# Patient Record
Sex: Male | Born: 1945 | Race: White | Hispanic: No | Marital: Single | Smoking: Never smoker
Health system: Southern US, Community
[De-identification: ages and names within clinical notes are randomized; demographics above are authoritative.]

## PROBLEM LIST (undated history)

## (undated) DIAGNOSIS — L0390 Cellulitis, unspecified: Secondary | ICD-10-CM

## (undated) DIAGNOSIS — C189 Malignant neoplasm of colon, unspecified: Secondary | ICD-10-CM

## (undated) DIAGNOSIS — G629 Polyneuropathy, unspecified: Secondary | ICD-10-CM

## (undated) DIAGNOSIS — K651 Peritoneal abscess: Secondary | ICD-10-CM

## (undated) DIAGNOSIS — F79 Unspecified intellectual disabilities: Secondary | ICD-10-CM

## (undated) DIAGNOSIS — R4701 Aphasia: Secondary | ICD-10-CM

## (undated) DIAGNOSIS — D649 Anemia, unspecified: Secondary | ICD-10-CM

## (undated) DIAGNOSIS — H9190 Unspecified hearing loss, unspecified ear: Secondary | ICD-10-CM

## (undated) DIAGNOSIS — A0472 Enterocolitis due to Clostridium difficile, not specified as recurrent: Secondary | ICD-10-CM

## (undated) DIAGNOSIS — Z923 Personal history of irradiation: Secondary | ICD-10-CM

## (undated) DIAGNOSIS — K668 Other specified disorders of peritoneum: Secondary | ICD-10-CM

## (undated) DIAGNOSIS — F09 Unspecified mental disorder due to known physiological condition: Secondary | ICD-10-CM

## (undated) DIAGNOSIS — I10 Essential (primary) hypertension: Secondary | ICD-10-CM

## (undated) DIAGNOSIS — J189 Pneumonia, unspecified organism: Secondary | ICD-10-CM

---

## 2002-04-11 ENCOUNTER — Emergency Department (HOSPITAL_COMMUNITY): Admission: EM | Admit: 2002-04-11 | Discharge: 2002-04-12 | Payer: Self-pay | Admitting: Emergency Medicine

## 2002-04-11 ENCOUNTER — Encounter: Payer: Self-pay | Admitting: Emergency Medicine

## 2002-04-12 ENCOUNTER — Encounter: Payer: Self-pay | Admitting: Emergency Medicine

## 2004-05-24 ENCOUNTER — Ambulatory Visit (HOSPITAL_COMMUNITY): Admission: RE | Admit: 2004-05-24 | Discharge: 2004-05-24 | Payer: Self-pay | Admitting: Family Medicine

## 2004-07-22 ENCOUNTER — Emergency Department (HOSPITAL_COMMUNITY): Admission: EM | Admit: 2004-07-22 | Discharge: 2004-07-22 | Payer: Self-pay | Admitting: Family Medicine

## 2005-02-04 ENCOUNTER — Emergency Department (HOSPITAL_COMMUNITY): Admission: EM | Admit: 2005-02-04 | Discharge: 2005-02-04 | Payer: Self-pay | Admitting: Family Medicine

## 2005-09-21 ENCOUNTER — Emergency Department (HOSPITAL_COMMUNITY): Admission: EM | Admit: 2005-09-21 | Discharge: 2005-09-21 | Payer: Self-pay | Admitting: Emergency Medicine

## 2005-09-22 ENCOUNTER — Inpatient Hospital Stay (HOSPITAL_COMMUNITY): Admission: AD | Admit: 2005-09-22 | Discharge: 2005-10-02 | Payer: Self-pay | Admitting: Orthopaedic Surgery

## 2005-09-23 HISTORY — PX: OTHER SURGICAL HISTORY: SHX169

## 2005-10-03 ENCOUNTER — Ambulatory Visit: Payer: Self-pay | Admitting: Gastroenterology

## 2005-11-29 ENCOUNTER — Ambulatory Visit (HOSPITAL_COMMUNITY): Admission: RE | Admit: 2005-11-29 | Discharge: 2005-11-29 | Payer: Self-pay | Admitting: Internal Medicine

## 2006-01-26 ENCOUNTER — Inpatient Hospital Stay (HOSPITAL_COMMUNITY): Admission: RE | Admit: 2006-01-26 | Discharge: 2006-01-26 | Payer: Self-pay | Admitting: Orthopaedic Surgery

## 2006-01-26 HISTORY — PX: OTHER SURGICAL HISTORY: SHX169

## 2006-05-12 ENCOUNTER — Emergency Department (HOSPITAL_COMMUNITY): Admission: EM | Admit: 2006-05-12 | Discharge: 2006-05-12 | Payer: Self-pay | Admitting: Pediatrics

## 2008-01-15 ENCOUNTER — Encounter: Admission: RE | Admit: 2008-01-15 | Discharge: 2008-01-15 | Payer: Self-pay | Admitting: Family Medicine

## 2009-07-12 ENCOUNTER — Emergency Department (HOSPITAL_COMMUNITY): Admission: EM | Admit: 2009-07-12 | Discharge: 2009-07-12 | Payer: Self-pay | Admitting: Emergency Medicine

## 2010-06-03 NOTE — Op Note (Signed)
[name], Victor Little             ACCOUNT NO.:  [id number]   MEDICAL RECORD NO.:  [id number]          PATIENT TYPE:  INP   LOCATION:  5033                         FACILITY:  [facility]   PHYSICIAN:  Mark C. Ophelia Charter, M.D.    DATE OF BIRTH:  07/08/1945   DATE OF PROCEDURE:  DATE OF DISCHARGE:                                 OPERATIVE REPORT   PREOPERATIVE DIAGNOSIS:  Displaced right patellar fracture.   POSTOPERATIVE DIAGNOSIS:  Displaced right patellar fracture.   PROCEDURE:  Open reduction, internal fixation, right patella.   SURGEON:  Mark C. Ophelia Charter, M.D.   ANESTHESIA:  GOT.   TOURNIQUET TIME:  40 minutes.   DRAINS:  None.   OPERATIVE PROCEDURE:  After induction of general anesthesia, preoperative  Ancef prophylaxis, standard prepping and draping, the patient had an  abrasion over the patellar tendon region.  For this reason, a midline  incision was not made, and a higher transverse incision was made.  The leg  was wrapped with an Esmarch.  The tourniquet was inflated after the usual  extremity sheets, drapes, rolled towel, tourniquet, impervious stockinette  and Coban had been applied.  The tourniquet was inflated at 350 and deflated  before the end of the case to obtain hemostasis.  A transverse incision was  made through the subcutaneous tissue, and immediately the hemarthrosis was  noted.  Large clots were sucked out of the knee.  The knee was irrigated  thoroughly.  Towel clips were used to grasp the 2 fragments.  The fracture  was reduced and then pinned with 2 smooth Steinmann pins.  It was checked  under fluoroscopy, and once the pins were parallel to the surface and  exiting proximally, an 18-gauge wire was used in a figure-of-eight fashion,  passed proximally around the pins, looping underneath the pins distally,  which were then bent and cut, and then tied down with a square knot  tightening it over the handle of a Cobb.  After the square knot was tied,  the ends of the  wire were twisted down.  The wound was irrigated.  The  retinaculum was closed on both sides with Vicryl suture, 2-0 Vicryl in the  subcutaneous tissue, running 4-0 Vicryl subcuticular closure.  Tincture of  benzoin and Steri-Strips.  Marcaine infiltration/injection into the joint  and postoperative dressing with knee immobilizer.  Instrument count, needle  count were correct.      Mark C. Ophelia Charter, M.D.  Electronically Signed     MCY/MEDQ  D:  09/23/2005  T:  09/23/2005  Job:  [id number]

## 2010-06-03 NOTE — Op Note (Signed)
Victor Little, WISE NO.:  [id number]   MEDICAL RECORD NO.:  [id number]          PATIENT TYPE:  INP   LOCATION:  2899                         FACILITY:  [facility]   PHYSICIAN:  Mark C. Ophelia Charter, M.D.    DATE OF BIRTH:  June 18, 1945   DATE OF PROCEDURE:  01/26/2006  DATE OF DISCHARGE:                               OPERATIVE REPORT   PREOPERATIVE DIAGNOSIS:  Retained hardware, right patellar fracture,  painful.   POSTOPERATIVE DIAGNOSIS:  Retained hardware, right patellar fracture,  painful.   PROCEDURE:  Hardware removal, right patella.   SURGEON:  Mark C. Ophelia Charter, M.D.   ANESTHESIA:  General with Marcaine local.   DESCRIPTION OF PROCEDURE:  After induction of general anesthesia,  standard prepping with DuraPrep, impervious stockinette to the midtibia,  wrapped with Coban, the usual extremity sheets and drapes were applied.  Incision was made vertically over the palpable tender pin, which was  lateral.  It was grasped with one needle driver and removed.  The second  pin more medial was less palpable.  A small incision was made, 1 cm.  The pin was identified with the [facility], grasped and removed.  Next, the old transverse incision was opened about 2 cm in the middle.  Using the [facility], the 18-gauge wire was hooked, pulled up, cut,  and then the piece closest to the square knot was grasped, and with  pressure applied, the entire wire came out.  The wound was irrigated and  closed with 2-0 Vicryl subcu reapproximation and subcuticular in the  transverse incision, and 2 stitches in each of the vertical incisions  over the distal pin.  The patient tolerated the procedure well.  Soft  dressing applied after Marcaine, and then transferred to the recovery  room and then to home.  Office followup in 1 week.      Mark C. Ophelia Charter, M.D.  Electronically Signed     MCY/MEDQ  D:  01/26/2006  T:  01/26/2006  Job:  [id number]

## 2010-06-03 NOTE — Discharge Summary (Signed)
Victor Little, Victor Little             ACCOUNT NO.:  [id number]   MEDICAL RECORD NO.:  [id number]          PATIENT TYPE:  INP   LOCATION:  5033                         FACILITY:  [facility]   PHYSICIAN:  Mark C. Ophelia Charter, M.D.    DATE OF BIRTH:  1945/08/18   DATE OF ADMISSION:  09/22/2005  DATE OF DISCHARGE:  10/02/2005                                 DISCHARGE SUMMARY   ADMISSION DIAGNOSES:  1. Displaced right patellar fracture.  2. Death mute.  3. Organic brain syndrome with dementia.  4. Mental retardation.  5. Anxiety.   DISCHARGE DIAGNOSES:  1. Displaced right patellar fracture.  2. Death mute.  3. Organic brain syndrome with dementia.  4. Mental retardation.  5. Anxiety.  6. Postoperative ileus, resolved at discharge.  7. Hypokalemia, resolved at discharge.   PROCEDURE:  On September 23, 2005, the patient underwent open reduction  internal fixation, right displaced patellar fracture by Dr. Ophelia Charter under  general anesthesia.   CONSULTATIONS:  [facility] and Carlus Pavlov, M.D., for internal  medicine.   BRIEF HISTORY:  The patient is a 65 year old male who has mental retardation  and is a deaf mute. 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 He sustained a right displaced patellar fracture when he fell onto  his knee. It was felt he would require intervention and was admitted to the  hospital by Dr. Ophelia Charter for surgical intervention.   BRIEF HOSPITAL COURSE:  The patient tolerated the procedure under general  anesthesia without complications. Postoperatively, he was started on  physical therapy for ambulation and gait training, allowed weight bearing as  tolerated on the operative extremity. He was placed in a knee immobilizer  which was to be on at all times. Dressing changes were done daily, and his  wound was healing without signs of infection. Neurovascular motor function  distally in the lower extremities remains intact. The patient was difficult  to  communicate with throughout the hospital stay but did fairly well with  advancing with physical therapy. Unfortunately, he developed abdominal  distention with nausea. On examination, he was noted to have significant  distention of the abdomen with absent bowel sounds and tympanic percussion.  No rebound tenderness was noted. He was placed n.p.o. NG was recommended;  however, the patient refused. A medical consult was obtained as he did have  elevated blood count of 11.5. His electrolyte studies continued to be normal  initially. He did have a decrease in sodium and potassium which were treated  with potassium supplements as well as IV fluid support. A GI consult was  obtained from [facility]. It was recommended that narcotic analgesics be  afforded, and the patient's medications were changed to Darvocet which he  used infrequently. Rectal tube was placed with good liquid output by Dr.  Christella Hartigan on September 29, 2005. His diet was advanced on September 15. Rectal  tube was removed on September 17. His ileus was felt to be resolving, and  nursing home placement was sought. A bed was available at [facility]. He was felt stable for transfer on October 02, 2005.   PERTINENT LABORATORY VALUES:  Admission CBC with WBC 11.2, hemoglobin 15.8,  hematocrit 46.5. September 14 CBC showed WBC 9.3, hemoglobin 12.4,  hematocrit 35.9. Coagulation studies on admission were within normal limits.  Chemistries with potassium dropping to the lowest value of 3.0. Lipase was  noted to be 131, albumin 2.9, calcium decreased at 7.4. Mildly elevated  glucoses during the hospital stay ranging from 133 to 101. Chest x-ray on  September 11 showed bibasilar subsegmental atelectasis. Abdominal x-rays  were monitored following his colonic ileus. EKG on admission with normal  sinus rhythm.   PLAN:  The patient will be transferred to [facility]. There, he  should continue with physical therapy on a  daily basis for ambulation and  gait training. He should wear his knee immobilizer at all times. He is to  have no strengthening exercises of the right lower extremity nor any range  of motion of the right knee. The patient may be weight bearing as tolerated  on the right lower extremity. Dressing changes to his wound should be done  daily. He will follow up in two weeks with Dr. Ophelia Charter. The patient should be  provided transportation and assisted in making the appointment by calling  [phone]. He should continue with a regular diet. Other medications include  Lasix 20 mg daily, Aricept 10 mg p.o. every night, Ativan 0.5 mg p.o.  b.i.d., Norvasc 10 mg p.o. daily, Lotensin 20 mg p.o. daily. Pain medication  in the form of Tylenol has been utilized as it is best for his abdominal  situation. He is also on Celebrex 200 mg p.o. daily. If there are questions  or concerns regarding his orthopedic care, please notify Dr. Ophelia Charter' office.   CONDITION ON DISCHARGE:  Stable.      Victor Little, P.A.      Mark C. Ophelia Charter, M.D.  Electronically Signed    SMV/MEDQ  D:  10/02/2005  T:  10/02/2005  Job:  [id number]

## 2010-06-03 NOTE — Procedures (Signed)
REFERRED BY:  Evelena Peat, M.D.   HISTORY:  This is a 65 year old patient with history of mental retardation,  deafness, hypertension.  The patient had an episode of arms and legs jerking  about 5 minutes.  The patient is being evaluated for possible seizure.   This is a routine EEG.  No skull defects were noted.   MEDICATIONS:  Lotrel, Lasix, Celebrex, Clarinex, Aricept.   EEG CLASSIFICATION:  Normal awake.   DESCRIPTION OF RECORDING:  Background rhythm in this recording consistent  with a very well modulated, medium amplitude alpha rhythm of 10 Hz that is  reactive to eye opening and closure.  As the record progresses, the patient  undergoes photic stimulation.  This results in bilateral and symmetric  photic driving response.  Hyperventilation is also performed resulting in a  very minimal buildup of background rhythm activity without significant  slowing seen.  The patient seemed to remain in the awakened state throughout  the entirety of the recording.  At no time during the recording did there  appear to be evidence of spikes, spike wave discharge, or focal slowing.  EKG monitor shows no evidence of cardiac rhythm abnormalities with heart  rate of 66.   IMPRESSION:  This is a normal EEG recording in the waking state.  No  evidence of ictal or interictal discharges are seen.      OZH:YQMV  D:  05/24/2004 15:28:39  T:  05/24/2004 16:57:31  Job #:  [id number]

## 2012-12-05 ENCOUNTER — Emergency Department (HOSPITAL_COMMUNITY): Payer: PRIVATE HEALTH INSURANCE

## 2012-12-05 ENCOUNTER — Inpatient Hospital Stay (HOSPITAL_COMMUNITY)
Admission: EM | Admit: 2012-12-05 | Discharge: 2012-12-27 | DRG: 870 | Disposition: A | Discharge status: Skilled Nursing Facility | Payer: PRIVATE HEALTH INSURANCE | Attending: Internal Medicine | Admitting: Internal Medicine

## 2012-12-05 ENCOUNTER — Encounter (HOSPITAL_COMMUNITY): Payer: Self-pay | Admitting: Emergency Medicine

## 2012-12-05 DIAGNOSIS — R799 Abnormal finding of blood chemistry, unspecified: Secondary | ICD-10-CM | POA: Diagnosis present

## 2012-12-05 DIAGNOSIS — J9601 Acute respiratory failure with hypoxia: Secondary | ICD-10-CM | POA: Diagnosis present

## 2012-12-05 DIAGNOSIS — K560 Paralytic ileus: Secondary | ICD-10-CM | POA: Diagnosis present

## 2012-12-05 DIAGNOSIS — E860 Dehydration: Secondary | ICD-10-CM | POA: Diagnosis present

## 2012-12-05 DIAGNOSIS — R4701 Aphasia: Secondary | ICD-10-CM

## 2012-12-05 DIAGNOSIS — M25539 Pain in unspecified wrist: Secondary | ICD-10-CM | POA: Diagnosis not present

## 2012-12-05 DIAGNOSIS — F079 Unspecified personality and behavioral disorder due to known physiological condition: Secondary | ICD-10-CM | POA: Diagnosis present

## 2012-12-05 DIAGNOSIS — E8779 Other fluid overload: Secondary | ICD-10-CM | POA: Diagnosis not present

## 2012-12-05 DIAGNOSIS — I10 Essential (primary) hypertension: Secondary | ICD-10-CM | POA: Diagnosis present

## 2012-12-05 DIAGNOSIS — R509 Fever, unspecified: Secondary | ICD-10-CM | POA: Diagnosis not present

## 2012-12-05 DIAGNOSIS — Z781 Physical restraint status: Secondary | ICD-10-CM | POA: Diagnosis not present

## 2012-12-05 DIAGNOSIS — H913 Deaf nonspeaking, not elsewhere classified: Secondary | ICD-10-CM | POA: Diagnosis present

## 2012-12-05 DIAGNOSIS — Z23 Encounter for immunization: Secondary | ICD-10-CM

## 2012-12-05 DIAGNOSIS — G9340 Encephalopathy, unspecified: Secondary | ICD-10-CM | POA: Diagnosis not present

## 2012-12-05 DIAGNOSIS — N179 Acute kidney failure, unspecified: Secondary | ICD-10-CM | POA: Diagnosis present

## 2012-12-05 DIAGNOSIS — A419 Sepsis, unspecified organism: Principal | ICD-10-CM | POA: Diagnosis present

## 2012-12-05 DIAGNOSIS — K668 Other specified disorders of peritoneum: Secondary | ICD-10-CM | POA: Diagnosis present

## 2012-12-05 DIAGNOSIS — E872 Acidosis, unspecified: Secondary | ICD-10-CM | POA: Diagnosis present

## 2012-12-05 DIAGNOSIS — D72829 Elevated white blood cell count, unspecified: Secondary | ICD-10-CM

## 2012-12-05 DIAGNOSIS — R197 Diarrhea, unspecified: Secondary | ICD-10-CM

## 2012-12-05 DIAGNOSIS — H9193 Unspecified hearing loss, bilateral: Secondary | ICD-10-CM

## 2012-12-05 DIAGNOSIS — F79 Unspecified intellectual disabilities: Secondary | ICD-10-CM | POA: Diagnosis present

## 2012-12-05 DIAGNOSIS — G629 Polyneuropathy, unspecified: Secondary | ICD-10-CM | POA: Diagnosis present

## 2012-12-05 DIAGNOSIS — G589 Mononeuropathy, unspecified: Secondary | ICD-10-CM | POA: Diagnosis present

## 2012-12-05 DIAGNOSIS — D75838 Other thrombocytosis: Secondary | ICD-10-CM | POA: Diagnosis present

## 2012-12-05 DIAGNOSIS — K529 Noninfective gastroenteritis and colitis, unspecified: Secondary | ICD-10-CM

## 2012-12-05 DIAGNOSIS — M79601 Pain in right arm: Secondary | ICD-10-CM

## 2012-12-05 DIAGNOSIS — R7989 Other specified abnormal findings of blood chemistry: Secondary | ICD-10-CM

## 2012-12-05 DIAGNOSIS — H9190 Unspecified hearing loss, unspecified ear: Secondary | ICD-10-CM | POA: Diagnosis present

## 2012-12-05 DIAGNOSIS — K651 Peritoneal abscess: Secondary | ICD-10-CM | POA: Diagnosis present

## 2012-12-05 DIAGNOSIS — K659 Peritonitis, unspecified: Secondary | ICD-10-CM | POA: Diagnosis present

## 2012-12-05 DIAGNOSIS — J189 Pneumonia, unspecified organism: Secondary | ICD-10-CM | POA: Diagnosis present

## 2012-12-05 DIAGNOSIS — K56609 Unspecified intestinal obstruction, unspecified as to partial versus complete obstruction: Secondary | ICD-10-CM | POA: Diagnosis present

## 2012-12-05 DIAGNOSIS — R062 Wheezing: Secondary | ICD-10-CM | POA: Diagnosis present

## 2012-12-05 DIAGNOSIS — I959 Hypotension, unspecified: Secondary | ICD-10-CM

## 2012-12-05 DIAGNOSIS — A0472 Enterocolitis due to Clostridium difficile, not specified as recurrent: Secondary | ICD-10-CM | POA: Diagnosis present

## 2012-12-05 DIAGNOSIS — J690 Pneumonitis due to inhalation of food and vomit: Secondary | ICD-10-CM | POA: Diagnosis not present

## 2012-12-05 DIAGNOSIS — Z79899 Other long term (current) drug therapy: Secondary | ICD-10-CM

## 2012-12-05 DIAGNOSIS — E876 Hypokalemia: Secondary | ICD-10-CM | POA: Diagnosis not present

## 2012-12-05 DIAGNOSIS — I9589 Other hypotension: Secondary | ICD-10-CM | POA: Diagnosis present

## 2012-12-05 DIAGNOSIS — J9600 Acute respiratory failure, unspecified whether with hypoxia or hypercapnia: Secondary | ICD-10-CM | POA: Diagnosis not present

## 2012-12-05 DIAGNOSIS — F09 Unspecified mental disorder due to known physiological condition: Secondary | ICD-10-CM | POA: Diagnosis present

## 2012-12-05 HISTORY — DX: Essential (primary) hypertension: I10

## 2012-12-05 HISTORY — DX: Unspecified hearing loss, unspecified ear: H91.90

## 2012-12-05 HISTORY — DX: Unspecified mental disorder due to known physiological condition: F09

## 2012-12-05 HISTORY — DX: Unspecified intellectual disabilities: F79

## 2012-12-05 LAB — CBC WITH DIFFERENTIAL/PLATELET
Basophils Relative: 0 % (ref 0–1)
Eosinophils Absolute: 0 10*3/uL (ref 0.0–0.7)
Hemoglobin: 14.5 g/dL (ref 13.0–17.0)
Lymphocytes Relative: 2 % — ABNORMAL LOW (ref 12–46)
MCHC: 34.5 g/dL (ref 30.0–36.0)
Neutrophils Relative %: 91 % — ABNORMAL HIGH (ref 43–77)
RBC: 4.9 MIL/uL (ref 4.22–5.81)

## 2012-12-05 LAB — COMPREHENSIVE METABOLIC PANEL
ALT: 18 U/L (ref 0–53)
ALT: 18 U/L (ref 0–53)
AST: 36 U/L (ref 0–37)
AST: 36 U/L (ref 0–37)
Albumin: 3 g/dL — ABNORMAL LOW (ref 3.5–5.2)
Albumin: 2.8 g/dL — ABNORMAL LOW (ref 3.5–5.2)
Alkaline Phosphatase: 72 U/L (ref 39–117)
Alkaline Phosphatase: 70 U/L (ref 39–117)
CO2: 22 mEq/L (ref 19–32)
Chloride: 104 mEq/L (ref 96–112)
Creatinine, Ser: 1.98 mg/dL — ABNORMAL HIGH (ref 0.50–1.35)
GFR calc Af Amer: 35 mL/min — ABNORMAL LOW (ref >90)
GFR calc Af Amer: 39 mL/min — ABNORMAL LOW (ref >90)
GFR calc non Af Amer: 33 mL/min — ABNORMAL LOW (ref >90)
Glucose, Bld: 117 mg/dL — ABNORMAL HIGH (ref 70–99)
Glucose, Bld: 101 mg/dL — ABNORMAL HIGH (ref 70–99)
Potassium: 4.5 mEq/L (ref 3.5–5.1)
Potassium: 4.2 mEq/L (ref 3.5–5.1)
Sodium: 141 mEq/L (ref 135–145)
Sodium: 139 mEq/L (ref 135–145)
Total Bilirubin: 0.8 mg/dL (ref 0.3–1.2)
Total Protein: 5.7 g/dL — ABNORMAL LOW (ref 6.0–8.3)

## 2012-12-05 LAB — CBC
Hemoglobin: 13.5 g/dL (ref 13.0–17.0)
MCH: 28.7 pg (ref 26.0–34.0)
RBC: 4.71 MIL/uL (ref 4.22–5.81)
RDW: 13.8 % (ref 11.5–15.5)
WBC: 24.3 10*3/uL — ABNORMAL HIGH (ref 4.0–10.5)

## 2012-12-05 LAB — URINALYSIS, ROUTINE W REFLEX MICROSCOPIC
Glucose, UA: NEGATIVE mg/dL
Hgb urine dipstick: NEGATIVE
Specific Gravity, Urine: 1.024 (ref 1.005–1.030)
pH: 5 (ref 5.0–8.0)

## 2012-12-05 LAB — URINE MICROSCOPIC-ADD ON

## 2012-12-05 LAB — MRSA PCR SCREENING: MRSA by PCR: NEGATIVE

## 2012-12-05 LAB — PROCALCITONIN: Procalcitonin: 5.28 ng/mL

## 2012-12-05 MED ORDER — ONDANSETRON HCL 4 MG/2ML IJ SOLN: 4.0000 mg | Freq: Four times a day (QID) | INTRAMUSCULAR | Status: DC | PRN

## 2012-12-05 MED ORDER — SODIUM CHLORIDE 0.9 % IV BOLUS (SEPSIS)
1000.0000 mL | Freq: Once | INTRAVENOUS | Status: AC
  Administered 2012-12-05: 1000 mL via INTRAVENOUS

## 2012-12-05 MED ORDER — FENTANYL CITRATE 0.05 MG/ML IJ SOLN
25.0000 ug | INTRAMUSCULAR | Status: DC | PRN
  Administered 2012-12-06 – 2012-12-07 (×7): 25 ug via INTRAVENOUS
  Filled 2012-12-05 (×7): qty 2

## 2012-12-05 MED ORDER — VANCOMYCIN HCL IN DEXTROSE 1-5 GM/200ML-% IV SOLN: 1000.0000 mg | Freq: Once | INTRAVENOUS | Status: DC

## 2012-12-05 MED ORDER — MORPHINE SULFATE 4 MG/ML IJ SOLN: 4.0000 mg | Freq: Once | INTRAMUSCULAR | Status: DC

## 2012-12-05 MED ORDER — SODIUM CHLORIDE 0.9 % IV SOLN
INTRAVENOUS | Status: DC
  Administered 2012-12-06: 03:00:00 via INTRAVENOUS

## 2012-12-05 MED ORDER — VANCOMYCIN HCL 10 G IV SOLR
1250.0000 mg | INTRAVENOUS | Status: DC
  Filled 2012-12-05: qty 1250

## 2012-12-05 MED ORDER — ACETAMINOPHEN 650 MG RE SUPP
650.0000 mg | Freq: Four times a day (QID) | RECTAL | Status: DC | PRN
  Filled 2012-12-05: qty 1

## 2012-12-05 MED ORDER — SODIUM CHLORIDE 0.9 % IR SOLN
500.0000 mg | Freq: Four times a day (QID) | Status: DC
  Administered 2012-12-05 – 2012-12-11 (×22): 500 mg via RECTAL
  Filled 2012-12-05 (×39): qty 500

## 2012-12-05 MED ORDER — VANCOMYCIN HCL IN DEXTROSE 1-5 GM/200ML-% IV SOLN
1000.0000 mg | INTRAVENOUS | Status: AC
  Administered 2012-12-05: 1000 mg via INTRAVENOUS
  Filled 2012-12-05: qty 200

## 2012-12-05 MED ORDER — PIPERACILLIN-TAZOBACTAM 3.375 G IVPB
3.3750 g | Freq: Three times a day (TID) | INTRAVENOUS | Status: DC
  Administered 2012-12-05 – 2012-12-19 (×41): 3.375 g via INTRAVENOUS
  Filled 2012-12-05 (×46): qty 50

## 2012-12-05 MED ORDER — INFLUENZA VAC SPLIT QUAD 0.5 ML IM SUSP
0.5000 mL | INTRAMUSCULAR | Status: AC
  Administered 2012-12-06: 0.5 mL via INTRAMUSCULAR
  Filled 2012-12-05 (×2): qty 0.5

## 2012-12-05 MED ORDER — SODIUM CHLORIDE 0.9 % IJ SOLN
3.0000 mL | Freq: Two times a day (BID) | INTRAMUSCULAR | Status: DC
  Administered 2012-12-05 (×2): 3 mL via INTRAVENOUS
  Administered 2012-12-11: 10 mL via INTRAVENOUS

## 2012-12-05 MED ORDER — ACETAMINOPHEN 325 MG PO TABS: 650.0000 mg | ORAL_TABLET | Freq: Four times a day (QID) | ORAL | Status: DC | PRN

## 2012-12-05 MED ORDER — VANCOMYCIN 50 MG/ML ORAL SOLUTION: 500.0000 mg | Freq: Four times a day (QID) | ORAL | Status: DC

## 2012-12-05 MED ORDER — SODIUM CHLORIDE 0.9 % IV SOLN
INTRAVENOUS | Status: DC
  Administered 2012-12-05: 09:00:00 via INTRAVENOUS

## 2012-12-05 MED ORDER — ONDANSETRON HCL 4 MG PO TABS: 4.0000 mg | ORAL_TABLET | Freq: Four times a day (QID) | ORAL | Status: DC | PRN

## 2012-12-05 MED ORDER — ONDANSETRON HCL 4 MG/2ML IJ SOLN: 4.0000 mg | Freq: Three times a day (TID) | INTRAMUSCULAR | Status: DC | PRN

## 2012-12-05 MED ORDER — METRONIDAZOLE IN NACL 5-0.79 MG/ML-% IV SOLN
500.0000 mg | Freq: Three times a day (TID) | INTRAVENOUS | Status: AC
  Administered 2012-12-05 – 2012-12-18 (×40): 500 mg via INTRAVENOUS
  Filled 2012-12-05 (×41): qty 100

## 2012-12-05 MED ORDER — IOHEXOL 300 MG/ML  SOLN
50.0000 mL | Freq: Once | INTRAMUSCULAR | Status: AC | PRN
  Administered 2012-12-05: 50 mL via ORAL

## 2012-12-05 MED ORDER — PIPERACILLIN-TAZOBACTAM 3.375 G IVPB 30 MIN
3.3750 g | INTRAVENOUS | Status: AC
Start: 2012-12-05 — End: 2012-12-05
  Administered 2012-12-05: 3.375 g via INTRAVENOUS
  Filled 2012-12-05: qty 50

## 2012-12-05 MED ORDER — PIPERACILLIN-TAZOBACTAM 3.375 G IVPB: 3.3750 g | Freq: Once | INTRAVENOUS | Status: DC

## 2012-12-05 MED ORDER — ONDANSETRON HCL 4 MG/2ML IJ SOLN
4.0000 mg | Freq: Once | INTRAMUSCULAR | Status: AC
  Administered 2012-12-05: 4 mg via INTRAVENOUS
  Filled 2012-12-05: qty 2

## 2012-12-05 MED ORDER — FENTANYL CITRATE 0.05 MG/ML IJ SOLN
50.0000 ug | Freq: Once | INTRAMUSCULAR | Status: AC
  Administered 2012-12-05: 50 ug via INTRAVENOUS
  Filled 2012-12-05: qty 2

## 2012-12-05 MED ORDER — SODIUM CHLORIDE 0.9 % IV BOLUS (SEPSIS)
1000.0000 mL | INTRAVENOUS | Status: DC | PRN
  Administered 2012-12-13 – 2012-12-21 (×2): 1000 mL via INTRAVENOUS

## 2012-12-05 MED ORDER — FENTANYL CITRATE 0.05 MG/ML IJ SOLN
50.0000 ug | Freq: Once | INTRAMUSCULAR | Status: AC
  Administered 2012-12-05: 50 ug via INTRAVENOUS

## 2012-12-05 MED ORDER — MORPHINE SULFATE 2 MG/ML IJ SOLN
2.0000 mg | INTRAMUSCULAR | Status: DC | PRN
  Administered 2012-12-06: 2 mg via INTRAVENOUS
  Filled 2012-12-05 (×2): qty 1

## 2012-12-05 NOTE — Progress Notes (Signed)
CRITICAL VALUE ALERT  Critical value received:  Positive Cdiff  Date of notification:  12-05-12  Time of notification:  1313  Critical value read back:  yes  Nurse who received alert:  Roney Jaffe, RN  MD notified (1st page):  Dr. Chancy Milroy  Time of first page:  1520  MD notified (2nd page):   Time of second page:  Responding MD: Chancy Milroy   Time MD responded:

## 2012-12-05 NOTE — H&P (Signed)
[facility] History and Physical  Victor Little WUJ:[id number] DOB: 09/06/1945 DOA: 12/05/2012  Referring physician: Rochele Raring, ER physician PCP: Patient is followed by a physician at [facility], will review records to enter in EMR Specialists: None  Chief Complaint: Diarrhea  HPI: Victor Little is a 67 y.o. male  With past history of organic brain syndrome, hypertension and is deaf who lives in a group home presented to the emergency room after multiple episodes of diarrhea. In the emergency room, he was noted to have markedly elevated white blood cell count, acute renal failure, hypotension also signs consistent with septic shock. Cultures were sent for C. difficile. CT scan confirmed rectosigmoid colitis. In addition,? Small bowel distraction was noted. Patient was given IV fluids and initially Zosyn and vancomycin in the emergency room. Hospitals were called for evaluation and admission. After accepting admission, patient was sent to the step down unit. C. difficile cultures came back positive and IV antibiotics were discontinued. C. difficile shock protocol started with IV Flagyl and vancomycin enemas  Review of Systems:  Patient seen after transfer to step down unit. Am unable to get review of systems as he currently is nonverbal.  Past Medical History  Diagnosis Date  . Mental retardation   . Organic brain syndrome   . Hypertension   . Deaf    No past surgical history on file. Social History:  reports that he does not drink alcohol or use illicit drugs. His tobacco history is not on file. Patient lives in a group home. I'm unable to get from him whether he smokes or drinks alcohol.  No Known Allergies  No family history on file. patient unable to give me any family history. Could not find any from his records  Prior to Admission medications   Medication Sig Start Date End Date Taking? Authorizing Provider  amLODipine (NORVASC) 10 MG tablet Take 1 tablet by  mouth daily. 11/16/12  Yes Historical Provider, MD  benazepril (LOTENSIN) 20 MG tablet Take 1 tablet by mouth daily. 11/16/12  Yes Historical Provider, MD  furosemide (LASIX) 20 MG tablet Take 1 tablet by mouth daily. 11/16/12  Yes Historical Provider, MD  gabapentin (NEURONTIN) 300 MG capsule Take 1 capsule by mouth 3 (three) times daily. 11/16/12  Yes Historical Provider, MD  hyoscyamine (LEVBID) 0.375 MG 12 hr tablet Take 0.375 mg by mouth 2 (two) times daily.   Yes Historical Provider, MD  loratadine (CLARITIN) 10 MG tablet Take 10 mg by mouth daily.   Yes Historical Provider, MD  meloxicam (MOBIC) 7.5 MG tablet Take 1 tablet by mouth daily. 11/16/12  Yes Historical Provider, MD  Probiotic Product (PROBIOTIC FORMULA PO) Take 1 capsule by mouth daily.   Yes Historical Provider, MD  risperiDONE (RISPERDAL) 1 MG tablet Take 1 tablet by mouth daily. 11/16/12  Yes Historical Provider, MD   Physical Exam: Filed Vitals:   12/05/12 1330  BP: 80/49  Pulse: 106  Temp: 99.7 F (37.6 C)  Resp: 14     General:   Somnolence, but opens his eyes and tracks. At this time does not verbalize. He is deaf.   Eyes: Sclera nonicteric, extraocular movements appear to be intact  ENT: Normocephalic, traumatic, poor dentition, mucous membranes are dry  Neck: Supple, no JVD  Cardiovascular: Regular rate and rhythm, S1-S2  Respiratory: Clear to auscultation bilaterally  Abdomen: Soft, mild distention,? Nontender, no bowel sounds  Skin: No skin breaks, tears or lesions   Musculoskeletal: no clubbing or cyanosis, trace  pitting edema  Psychiatric: Patient has underlying history of questionable mental retardation/organic brain syndrome  Neurologic: No overt deficits  Labs on Admission:  Basic Metabolic Panel:  Recent Labs Lab 12/05/12 0747  NA 141  K 4.5  CL 106  CO2 21  GLUCOSE 117*  BUN 44*  CREATININE 2.16*  CALCIUM 7.3*   Liver Function Tests:  Recent Labs Lab 12/05/12 0747  AST 36   ALT 18  ALKPHOS 72  BILITOT 0.9  PROT 5.7*  ALBUMIN 3.0*    Recent Labs Lab 12/05/12 0747  LIPASE 48   No results found for this basename: AMMONIA,  in the last 168 hours CBC:  Recent Labs Lab 12/05/12 0747  WBC 27.7*  NEUTROABS 25.2*  HGB 14.5  HCT 42.0  MCV 85.7  PLT 190   Cardiac Enzymes: No results found for this basename: CKTOTAL, CKMB, CKMBINDEX, TROPONINI,  in the last 168 hours  BNP (last 3 results) No results found for this basename: PROBNP,  in the last 8760 hours CBG: No results found for this basename: GLUCAP,  in the last 168 hours  Radiological Exams on Admission: Ct Abdomen Pelvis Wo Contrast  12/05/2012     IMPRESSION: Thickening of the walls of the rectosigmoid colon suggestive of colitis.  Findings worrisome for partial or early small bowel obstruction due to adhesions. No evidence of ischemia is present.  Small bilateral pleural effusions and basilar atelectasis.  Contrast in the distal esophagus compatible with esophageal reflux or dysmotility. Walls of the distal esophagus are mildly thickened suggesting esophagitis.   Electronically Signed   By: Drusilla Kanner M.D.   On: 12/05/2012 11:22      Assessment/Plan Principal Problem:   Septic shock: Positive C. difficile cultures. Reports are that patient was exposed to another member of group home who did have C. difficile. Started on IV Flagyl plus vancomycin enemas given small bowel obstruction. Aggressive IV fluid resuscitation. Looks to be stabilizing. She can step down unit Active Problems: C. difficile: See above   Organic brain syndrome (chronic)   Deaf   ARF (acute renal failure): Secondary to shock. IV fluids.   Secondary hypotension: Secondary to shock. IV fluids.   SBO (small bowel obstruction): Stable for now. Does not look uncomfortable. Repeat x-ray in the morning, if worse, consult surgery and/or place NG tube:   HTN (hypertension): N.p.o. and given hypotension, no  antihypertensives at this time   Neuropathy: Holding Neurontin    Code Status: Confirmed full code as per care facility  Family Communication: Family was here earlier, although no contact number on chart. Group home care coordinator number listed  Disposition Plan: Here for at least a few days  Time spent: 35 minutes  Hollice Espy [facility] Pager [phone]  If 7PM-7AM, please contact night-coverage [email or web address] Password [facility] 12/05/2012, 2:27 PM

## 2012-12-05 NOTE — Progress Notes (Signed)
CARE MANAGEMENT NOTE 12/05/2012  Patient:  Victor Little, Victor Little   Account Number:  [id number]  Date Initiated:  12/05/2012  Documentation initiated by:  Roger Kettles  Subjective/Objective Assessment:   pt with mental challenges and now hypotensive, positive c-diff,     Action/Plan:   tbd/from [facility]   Anticipated DC Date:  12/08/2012   Anticipated DC Plan:  GROUP HOME  In-house referral  Clinical Social Worker      DC Planning Services  NA      [facility] Choice  NA   Choice offered to / List presented to:  NA   DME arranged  NA      DME agency  NA     HH arranged  NA      HH agency  NA   Status of service:  In process, will continue to follow Medicare Important Message given?  NA - LOS <3 / Initial given by admissions (If response is "NO", the following Medicare IM given date fields will be blank) Date Medicare IM given:   Date Additional Medicare IM given:    Discharge Disposition:    Per UR Regulation:  Reviewed for med. necessity/level of care/duration of stay  If discussed at Long Length of Stay Meetings, dates discussed:    Comments:  [id number] Tep Lorrin Mais: Case management [phone] Chart reviewed and updated.  Next chart review due on [id number]. Needs for discharge at time of review:  None

## 2012-12-05 NOTE — ED Notes (Signed)
Per EMS report: pt from [facility]: pt has had gross amount of diarrhea x 12 hours.  Pt is deaf at baseline as welling as having an organic brain disorder.  Facility informed EMS that pt is normally able to get up and feed himself but has not had the energy to do so this morning.  On EMS arrival, pt's initial was 81/50 to which EMS infused a 1L NaCl bolus.  Pt's BP increased 96/60.  EMS noted pt experiecing some chills. EMS CBG: 132

## 2012-12-05 NOTE — ED Provider Notes (Signed)
TIME SEEN: 7:32 AM  CHIEF COMPLAINT: Vomiting, diarrhea  HPI: Patient is a 67 year old male with a history of hypertension, mental retardation, deafness who presents to the emergency department from his group home, therapeutic alternatives, with 12 hours of vomiting and diarrhea. Staff report that he has not had a fever, bloody stool or melena. No recent antibiotic use or hospitalization. No known sick contacts. Per EMS, patient's initial blood pressure was 81/50. He was given 1 L of IV fluid and his blood pressure improved to 96/60. His blood glucose was 132. Patient has been having chills and is complaining of abdominal pain and headache.  PCP is at [facility]  ROS: See HPI Constitutional: no fever  Eyes: no drainage  ENT: no runny nose   Cardiovascular:  no chest pain  Resp: no SOB  GI: vomiting GU: no dysuria Integumentary: no rash  Allergy: no hives  Musculoskeletal: no leg swelling  Neurological: no slurred speech ROS otherwise negative  PAST MEDICAL HISTORY/PAST SURGICAL HISTORY:  Past Medical History  Diagnosis Date  . Mental retardation   . Organic brain syndrome   . Hypertension   . Deaf     MEDICATIONS:  Prior to Admission medications   Medication Sig Start Date End Date Taking? Authorizing Provider  amLODipine (NORVASC) 10 MG tablet Take 1 tablet by mouth daily. 11/16/12   Historical Provider, MD  benazepril (LOTENSIN) 20 MG tablet Take 1 tablet by mouth daily. 11/16/12   Historical Provider, MD  furosemide (LASIX) 20 MG tablet Take 1 tablet by mouth daily. 11/16/12   Historical Provider, MD  gabapentin (NEURONTIN) 300 MG capsule Take 1 capsule by mouth 3 (three) times daily. 11/16/12   Historical Provider, MD  meloxicam (MOBIC) 7.5 MG tablet Take 1 tablet by mouth daily. 11/16/12   Historical Provider, MD  risperiDONE (RISPERDAL) 1 MG tablet Take 1 tablet by mouth daily. 11/16/12   Historical Provider, MD    ALLERGIES:  No Known Allergies  SOCIAL HISTORY:   History  Substance Use Topics  . Smoking status: Not on file  . Smokeless tobacco: Not on file  . Alcohol Use: No    FAMILY HISTORY: No family history on file.  EXAM: BP 96/59  Pulse 107  Temp(Src) 97.6 F (36.4 C) (Oral)  Resp 18  SpO2 95% CONSTITUTIONAL: Alert and oriented and responds appropriately to questions with sign language interpreter. Well-appearing; mildly dehydrated HEAD: Normocephalic EYES: Conjunctivae clear, PERRL ENT: normal nose; no rhinorrhea; dry mucous membranes; pharynx without lesions noted NECK: Supple, no meningismus, no LAD  CARD: Tachycardic; S1 and S2 appreciated; no murmurs, no clicks, no rubs, no gallops RESP: Normal chest excursion without splinting or tachypnea; breath sounds clear and equal bilaterally; no wheezes, no rhonchi, no rales,  ABD/GI: Normal bowel sounds; non-distended; soft, tender to palpation in the right lower quadrant with guarding, no rebound or peritoneal signs BACK:  The back appears normal and is non-tender to palpation, there is no CVA tenderness EXT: Normal ROM in all joints; non-tender to palpation; no edema; normal capillary refill; no cyanosis    SKIN: Normal color for age and race; warm; pt has dried diarrhea on his lower extremities NEURO: Moves all extremities equally; no facial droop PSYCH: The patient's mood and manner are appropriate. Grooming and personal hygiene are appropriate.  MEDICAL DECISION MAKING: Patient here with vomiting and diarrhea for the past 12 hours. He also has right lower quadrant pain on exam - concern for appendicitis v colitis v UTI v viral illness.  No recent antibiotic use, hospitalization or immunocompromised state to suggest  C diff colitis.  Suspect hypotension and tachycardia secondary to dehydration from vomiting and diarrhea and that this will continue to improve with IV fluids. Will obtain labs, urine and CT abdomen pelvis. We'll continue IV fluids, give pain and nausea medication.  ED  PROGRESS: The patient has a leukocytosis of 27.7 with left shift. Lactate is 3.16. Creatinine is elevated at 2.16, suspect this is do to dehydration. Blood pressure has continued to improve with IV fluids. Will also give broad-spectrum antibiotics and obtain blood culture, urine culture. Will also obtain stool culture, PCR for C diff.  CT abdomen and pelvis is pending.   8:36 AM  BP improved to 111/61 after 3L IVF.  Per group home records, SBP usually in the 130s.  He did not receive his anti-hypertensives today.  10:35 AM  BP continuing to improve and stable.  Pt is still tachycardic.  CT AP pending.  11:45 AM  BP stable.  CT AP shows thickening of the walls of the rectosigmoid colon suggestive of colitis. Stool cultures pending. He has received antibiotics. Findings also worrisome for partial or early small bowel obstruction due to adhesions. Will discuss with hospitalist for admission for the need hydration, IV antibiotics, bowel rest.      CRITICAL CARE Performed by: Raelyn Number   Total critical care time: 30 minutes  Critical care time was exclusive of separately billable procedures and treating other patients.  Critical care was necessary to treat or prevent imminent or life-threatening deterioration.  Critical care was time spent personally by me on the following activities: development of treatment plan with patient and/or surrogate as well as nursing, discussions with consultants, evaluation of patient's response to treatment, examination of patient, obtaining history from patient or surrogate, ordering and performing treatments and interventions, ordering and review of laboratory studies, ordering and review of radiographic studies, pulse oximetry and re-evaluation of patient's condition.   Layla Maw Ward, DO 12/05/12 1624

## 2012-12-05 NOTE — Progress Notes (Signed)
ANTIBIOTIC CONSULT NOTE - INITIAL  Pharmacy Consult for vancomycin, Zosyn Indication: rule out sepsis  No Known Allergies  Patient Measurements: Height: 6' (182.9 cm) Weight: 182 lb 5 oz (82.696 kg) IBW/kg (Calculated) : 77.6  Vital Signs: Temp: 98.5 F (36.9 C) (11/20 0758) Temp src: Rectal (11/20 0758) BP: 100/60 mmHg (11/20 0915) Pulse Rate: 111 (11/20 0915) Intake/Output from previous day:   Intake/Output from this shift:    Labs:  Recent Labs  12/05/12 0747  WBC 27.7*  HGB 14.5  PLT 190  CREATININE 2.16*   Estimated Creatinine Clearance: 36.9 ml/min (by C-G formula based on Cr of 2.16). No results found for this basename: VANCOTROUGH, VANCOPEAK, VANCORANDOM, GENTTROUGH, GENTPEAK, GENTRANDOM, TOBRATROUGH, TOBRAPEAK, TOBRARND, AMIKACINPEAK, AMIKACINTROU, AMIKACIN,  in the last 72 hours   Microbiology: No results found for this or any previous visit (from the past 720 hour(s)).  Medical History: Past Medical History  Diagnosis Date  . Mental retardation   . Organic brain syndrome   . Hypertension   . Deaf     Medications:  Scheduled:   Infusions:  . sodium chloride 150 mL/hr at 12/05/12 0914  . vancomycin 1,000 mg (12/05/12 1610)   Assessment: 67 yo with hx HTN, mental retardation, deafness who presented to ER from group home with 12 hours of vomiting and diarrhea and initial BP of 81/50. Patient also with elevated lactic acid and procalcitonin. Md ordered to start broad spectrum abx's for possible sepsis (appendicitis vs colitis vs UTI vs viral illness per ER Md)  Estimated CrCl 34N  Elevated WBC  To also rule out Cdiff  Goal of Therapy:  Vancomycin trough level 15-20 mcg/ml  Plan:  1) Vancomycin 1g x 1 in ER at 0917 then 1250mg  IV q24 thereafter 2) Zosyn 3.375g IV x 1 in ER then Zosyn 3.375g IV q8 (extended interval infusion) for CrCl > 20 ml/min 3) Ruling out Cdiff - should Flagyl be started?  Hessie Knows, PharmD, BCPS Pager  [phone] 12/05/2012 10:01 AM

## 2012-12-05 NOTE — ED Notes (Signed)
Bed: AV40 Expected date:  Expected time:  Means of arrival:  Comments: EMS 67yo M Diarrhea

## 2012-12-05 NOTE — ED Notes (Signed)
Patient and family will call when the patient has to use the bathroom inorder to get a stool culture

## 2012-12-06 ENCOUNTER — Inpatient Hospital Stay (HOSPITAL_COMMUNITY): Payer: PRIVATE HEALTH INSURANCE

## 2012-12-06 DIAGNOSIS — I959 Hypotension, unspecified: Secondary | ICD-10-CM

## 2012-12-06 DIAGNOSIS — I9589 Other hypotension: Secondary | ICD-10-CM

## 2012-12-06 DIAGNOSIS — I10 Essential (primary) hypertension: Secondary | ICD-10-CM

## 2012-12-06 DIAGNOSIS — R197 Diarrhea, unspecified: Secondary | ICD-10-CM

## 2012-12-06 LAB — CBC
Hemoglobin: 13.1 g/dL (ref 13.0–17.0)
MCH: 29.3 pg (ref 26.0–34.0)
MCV: 86.4 fL (ref 78.0–100.0)
Platelets: 170 10*3/uL (ref 150–400)
RBC: 4.47 MIL/uL (ref 4.22–5.81)
WBC: 18.5 10*3/uL — ABNORMAL HIGH (ref 4.0–10.5)

## 2012-12-06 LAB — URINE CULTURE
Colony Count: NO GROWTH
Culture: NO GROWTH

## 2012-12-06 LAB — COMPREHENSIVE METABOLIC PANEL
ALT: 15 U/L (ref 0–53)
AST: 31 U/L (ref 0–37)
Albumin: 2.5 g/dL — ABNORMAL LOW (ref 3.5–5.2)
BUN: 39 mg/dL — ABNORMAL HIGH (ref 6–23)
Calcium: 6.6 mg/dL — ABNORMAL LOW (ref 8.4–10.5)
Chloride: 108 mEq/L (ref 96–112)
Creatinine, Ser: 1.8 mg/dL — ABNORMAL HIGH (ref 0.50–1.35)
Potassium: 4.3 mEq/L (ref 3.5–5.1)

## 2012-12-06 MED ORDER — KCL IN DEXTROSE-NACL 20-5-0.45 MEQ/L-%-% IV SOLN
1000.0000 mL | INTRAVENOUS | Status: DC
  Administered 2012-12-06 – 2012-12-08 (×4): 1000 mL via INTRAVENOUS
  Filled 2012-12-06 (×9): qty 1000

## 2012-12-06 MED ORDER — LABETALOL HCL 5 MG/ML IV SOLN
5.0000 mg | Freq: Four times a day (QID) | INTRAVENOUS | Status: DC | PRN
  Administered 2012-12-06: 5 mg via INTRAVENOUS
  Filled 2012-12-06: qty 4

## 2012-12-06 NOTE — Progress Notes (Signed)
[facility] PROGRESS NOTE  ALDO SONDGEROTH IHK:[id number] DOB: [birth year] DOA: 12/05/2012 PCP: Pcp Not In System  Assessment/Plan: 1. Septic Shock - Due to C diff colitis - Will continue current IV antibiotic regimen (Metronidazole, vancomycin, zosyn) . WBC trending down - supportive care, will place on MIVF's since patient is npo  2. SBO - Reportedly on CT suspected to be 2ary to adhesions - Will ambulate in room today - supportive therapy - repeat abdominal x ray next am - No active nausea or emesis as such NG tube not in but should this change will order ng tube placement.  3. Sinus tachycardia/fever - Most likely due to infectious etiology. Associated with elevated blood pressure too as such will add IV Labetalol 5 mg q 6 hrs prn elevated blood pressure with holding parameters.  4. c diff colitis - On Metronidazole IV and Vancomycin PR. Once able to tolerate diet will place on po vancomycin.  5. Hypotension - resolved and most likely due to # 1 - Last blood pressure elevated with systolic on monitor at > 145  6. ARF - Most likely 2ary to # 1, with etiology uncertain and could be 2ary to ATN vs hypovolemia given SBO. - Creatinine trending down. Will place on MIVF and reassess S creatinine next am.   Code Status: full Family Communication: No family at bedside Disposition Plan: Pending continued improvement in condition. If SBO worse next am will plan on consulting General surgery  Consultants:  None  Procedures:  None  Antibiotics:  AS listed above.   HPI/Subjective: Pt states that he feels better. No new complaints.   Objective: Filed Vitals:   12/06/12 0700  BP: 123/65  Pulse: 124  Temp: 100.8 F (38.2 C)  Resp: 15    Intake/Output Summary (Last 24 hours) at 12/06/12 0915 Last data filed at 12/06/12 0900  Gross per 24 hour  Intake   4625 ml  Output   2045 ml  Net   2580 ml   Filed Weights   12/05/12 0917 12/05/12 1300  Weight: 82.696  kg (182 lb 5 oz) 85.8 kg (189 lb 2.5 oz)    Exam:   General:  Pt in NAD, Alert and Awake  Cardiovascular: normal S1 and S2 with rapid rate (sinus on telemetry monitoring), no murmurs  Respiratory: CTA BL, no wheezes  Abdomen: soft, distended, pain on deep palpation  Musculoskeletal: no cyanosis or clubbing   Data Reviewed: Basic Metabolic Panel:  Recent Labs Lab 12/05/12 0747 12/05/12 1728 12/06/12 0330  NA 141 139 138  K 4.5 4.2 4.3  CL 106 104 108  CO2 21 22 21   GLUCOSE 117* 101* 114*  BUN 44* 45* 39*  CREATININE 2.16* 1.98* 1.80*  CALCIUM 7.3* 7.0* 6.6*   Liver Function Tests:  Recent Labs Lab 12/05/12 0747 12/05/12 1728 12/06/12 0330  AST 36 36 31  ALT 18 18 15   ALKPHOS 72 70 65  BILITOT 0.9 0.8 0.6  PROT 5.7* 5.7* 5.3*  ALBUMIN 3.0* 2.8* 2.5*    Recent Labs Lab 12/05/12 0747  LIPASE 48   No results found for this basename: AMMONIA,  in the last 168 hours CBC:  Recent Labs Lab 12/05/12 0747 12/05/12 1728 12/06/12 0330  WBC 27.7* 24.3* 18.5*  NEUTROABS 25.2*  --   --   HGB 14.5 13.5 13.1  HCT 42.0 40.3 38.6*  MCV 85.7 85.6 86.4  PLT 190 207 170   Cardiac Enzymes: No results found for this basename: CKTOTAL, CKMB,  CKMBINDEX, TROPONINI,  in the last 168 hours BNP (last 3 results) No results found for this basename: PROBNP,  in the last 8760 hours CBG: No results found for this basename: GLUCAP,  in the last 168 hours  Recent Results (from the past 240 hour(s))  CULTURE, BLOOD (ROUTINE X 2)     Status: None   Collection Time    12/05/12  8:43 AM      Result Value Range Status   Specimen Description BLOOD RIGHT HAND   Final   Special Requests BOTTLES DRAWN AEROBIC AND ANAEROBIC EA   Final   Culture  Setup Time     Final   Value: 12/05/2012 13:28     Performed at [facility]   Culture     Final   Value:        BLOOD CULTURE RECEIVED NO GROWTH TO DATE CULTURE WILL BE HELD FOR 5 DAYS BEFORE ISSUING A FINAL NEGATIVE REPORT      Performed at [facility]   Report Status PENDING   Incomplete  CULTURE, BLOOD (ROUTINE X 2)     Status: None   Collection Time    12/05/12  8:50 AM      Result Value Range Status   Specimen Description BLOOD LEFT WRIST   Final   Special Requests BOTTLES DRAWN AEROBIC AND ANAEROBIC 5CC EA   Final   Culture  Setup Time     Final   Value: 12/05/2012 13:28     Performed at [facility]   Culture     Final   Value:        BLOOD CULTURE RECEIVED NO GROWTH TO DATE CULTURE WILL BE HELD FOR 5 DAYS BEFORE ISSUING A FINAL NEGATIVE REPORT     Performed at [facility]   Report Status PENDING   Incomplete  CLOSTRIDIUM DIFFICILE BY PCR     Status: Abnormal   Collection Time    12/05/12 12:38 PM      Result Value Range Status   C difficile by pcr POSITIVE (*) NEGATIVE Final   Comment: CRITICAL RESULT CALLED TO, READ BACK BY AND VERIFIED WITH:     Garey Ham RN 15:15 12/05/12 ([email or web address])     Performed at [facility]  MRSA PCR SCREENING     Status: None   Collection Time    12/05/12  1:26 PM      Result Value Range Status   MRSA by PCR NEGATIVE  NEGATIVE Final   Comment:            The GeneXpert MRSA Assay (FDA     approved for NASAL specimens     only), is one component of a     comprehensive MRSA colonization     surveillance program. It is not     intended to diagnose MRSA     infection nor to guide or     monitor treatment for     MRSA infections.     Studies: Ct Abdomen Pelvis Wo Contrast  12/05/2012   CLINICAL DATA:  Fatigue.  Diarrhea.  EXAM: CT ABDOMEN AND PELVIS WITHOUT CONTRAST  TECHNIQUE: Multidetector CT imaging of the abdomen and pelvis was performed following the standard protocol without intravenous contrast.  COMPARISON:  None.  FINDINGS: The patient has very small bilateral pleural effusions. There is no pericardial effusion. Heart size is normal. Contrast material is seen in the distal esophagus consistent with poor motility and/or reflux  disease.  Walls of the distal esophagus are mildly thickened. There is some dependent atelectasis in the lung bases, worse on the left.  Walls of the rectosigmoid colon appear thickened most consistent with colitis. There is a large amount of stool throughout the ascending and transverse colon. Radiopaque densities within the colon likely represent ingested medicine. The appendix is visualized and unremarkable. The stomach is distended and there are mildly dilated loops of small bowel measuring up to 3.1 cm with air-fluid levels identified. There is an abrupt change in the caliber of small bowel in the right lower quadrant where a kinked loop is identified. See images 59-62. There is a trace amount of fluid in the right pericolic gutter.  The kidneys are atrophic bilaterally without focal lesion. The spleen, gallbladder, adrenal glands and pancreas appear normal. Retroaortic left renal vein is noted. There is no lymphadenopathy. No focal bony abnormality is identified.  IMPRESSION: Thickening of the walls of the rectosigmoid colon suggestive of colitis.  Findings worrisome for partial or early small bowel obstruction due to adhesions. No evidence of ischemia is present.  Small bilateral pleural effusions and basilar atelectasis.  Contrast in the distal esophagus compatible with esophageal reflux or dysmotility. Walls of the distal esophagus are mildly thickened suggesting esophagitis.   Electronically Signed   By: Drusilla Kanner M.D.   On: 12/05/2012 11:22   Dg Abd 1 View  12/06/2012   CLINICAL DATA:  Small bowel obstruction.  EXAM: ABDOMEN - 1 VIEW  COMPARISON:  CT scan dated 12/05/2012 and radiographs dated 10/01/2005  FINDINGS: There is increased gas in multiple distended loops small bowel. There is also increased air in the nondistended colon and stomach.  IMPRESSION: Persistent partial small bowel obstruction. Small bowel distention has increased.   Electronically Signed   By: Geanie Cooley M.D.   On:  12/06/2012 08:34    Scheduled Meds: . influenza vac split quadrivalent PF  0.5 mL Intramuscular Tomorrow-1000  . metronidazole  500 mg Intravenous Q8H  . piperacillin-tazobactam (ZOSYN)  IV  3.375 g Intravenous Q8H  . sodium chloride  3 mL Intravenous Q12H  . vancomycin (VANCOCIN) rectal ENEMA  500 mg Rectal Q6H   Continuous Infusions: . sodium chloride Stopped (12/06/12 0300)  . sodium chloride 150 mL/hr at 12/06/12 9604    Principal Problem:   Septic shock Active Problems:   Diarrhea   Organic brain syndrome (chronic)   Deaf   ARF (acute renal failure)   Secondary hypotension   SBO (small bowel obstruction)   HTN (hypertension)   Neuropathy   Clostridium difficile colitis    Time spent: > 35 minutes    Penny Pia  [facility] Pager [phone]. If 7PM-7AM, please contact night-coverage at [email or web address], password [facility] 12/06/2012, 9:15 AM  LOS: 1 day

## 2012-12-07 ENCOUNTER — Inpatient Hospital Stay (HOSPITAL_COMMUNITY): Payer: PRIVATE HEALTH INSURANCE

## 2012-12-07 ENCOUNTER — Inpatient Hospital Stay (HOSPITAL_COMMUNITY): Payer: PRIVATE HEALTH INSURANCE | Admitting: Anesthesiology

## 2012-12-07 ENCOUNTER — Encounter (HOSPITAL_COMMUNITY): Payer: PRIVATE HEALTH INSURANCE | Admitting: Anesthesiology

## 2012-12-07 DIAGNOSIS — J9600 Acute respiratory failure, unspecified whether with hypoxia or hypercapnia: Secondary | ICD-10-CM

## 2012-12-07 DIAGNOSIS — R062 Wheezing: Secondary | ICD-10-CM

## 2012-12-07 DIAGNOSIS — A0472 Enterocolitis due to Clostridium difficile, not specified as recurrent: Secondary | ICD-10-CM

## 2012-12-07 DIAGNOSIS — K560 Paralytic ileus: Secondary | ICD-10-CM

## 2012-12-07 LAB — BLOOD GAS, ARTERIAL
Acid-base deficit: 2.6 mmol/L — ABNORMAL HIGH (ref 0.0–2.0)
Bicarbonate: 21.8 mEq/L (ref 20.0–24.0)
Drawn by: 275531
FIO2: 100 %
MECHVT: 620 mL
O2 Saturation: 99.3 %
PEEP: 5 cmH2O
RATE: 16 resp/min
TCO2: 19.3 mmol/L (ref 0–100)
pCO2 arterial: 38.8 mmHg (ref 35.0–45.0)
pH, Arterial: 7.369 (ref 7.350–7.450)
pO2, Arterial: 197 mmHg — ABNORMAL HIGH (ref 80.0–100.0)

## 2012-12-07 LAB — CBC
HCT: 39.5 % (ref 39.0–52.0)
Hemoglobin: 13.6 g/dL (ref 13.0–17.0)
MCHC: 34.4 g/dL (ref 30.0–36.0)
MCV: 85.5 fL (ref 78.0–100.0)
RDW: 14.3 % (ref 11.5–15.5)
WBC: 16.7 10*3/uL — ABNORMAL HIGH (ref 4.0–10.5)

## 2012-12-07 LAB — BASIC METABOLIC PANEL
BUN: 25 mg/dL — ABNORMAL HIGH (ref 6–23)
Chloride: 109 mEq/L (ref 96–112)
Creatinine, Ser: 1.27 mg/dL (ref 0.50–1.35)
GFR calc Af Amer: 66 mL/min — ABNORMAL LOW (ref >90)
Glucose, Bld: 131 mg/dL — ABNORMAL HIGH (ref 70–99)
Potassium: 4.1 mEq/L (ref 3.5–5.1)

## 2012-12-07 LAB — PROCALCITONIN: Procalcitonin: 1.61 ng/mL

## 2012-12-07 LAB — LACTIC ACID, PLASMA: Lactic Acid, Venous: 1.3 mmol/L (ref 0.5–2.2)

## 2012-12-07 MED ORDER — FAMOTIDINE IN NACL 20-0.9 MG/50ML-% IV SOLN
20.0000 mg | INTRAVENOUS | Status: DC
  Administered 2012-12-07 – 2012-12-26 (×20): 20 mg via INTRAVENOUS
  Filled 2012-12-07 (×23): qty 50

## 2012-12-07 MED ORDER — SODIUM CHLORIDE 0.9 % IV SOLN
25.0000 ug/h | INTRAVENOUS | Status: DC
  Administered 2012-12-07: 100 ug/h via INTRAVENOUS
  Administered 2012-12-08: 25 ug/h via INTRAVENOUS
  Filled 2012-12-07 (×2): qty 50

## 2012-12-07 MED ORDER — MIDAZOLAM HCL 2 MG/2ML IJ SOLN
INTRAMUSCULAR | Status: AC
  Administered 2012-12-07: 4 mg via INTRAVENOUS
  Filled 2012-12-07: qty 4

## 2012-12-07 MED ORDER — CHLORHEXIDINE GLUCONATE 0.12 % MT SOLN
15.0000 mL | Freq: Two times a day (BID) | OROMUCOSAL | Status: DC
  Administered 2012-12-07 – 2012-12-11 (×9): 15 mL via OROMUCOSAL
  Filled 2012-12-07 (×9): qty 15

## 2012-12-07 MED ORDER — PROPOFOL 10 MG/ML IV BOLUS
INTRAVENOUS | Status: DC | PRN
  Administered 2012-12-07: 100 mg via INTRAVENOUS

## 2012-12-07 MED ORDER — SUCCINYLCHOLINE CHLORIDE 20 MG/ML IJ SOLN
INTRAMUSCULAR | Status: DC | PRN
  Administered 2012-12-07: 100 mg via INTRAVENOUS

## 2012-12-07 MED ORDER — LEVALBUTEROL HCL 0.63 MG/3ML IN NEBU: 0.6300 mg | INHALATION_SOLUTION | Freq: Four times a day (QID) | RESPIRATORY_TRACT | Status: DC | PRN

## 2012-12-07 MED ORDER — BIOTENE DRY MOUTH MT LIQD
1.0000 "application " | Freq: Four times a day (QID) | OROMUCOSAL | Status: DC
  Administered 2012-12-07 – 2012-12-27 (×59): 15 mL via OROMUCOSAL

## 2012-12-07 MED ORDER — FENTANYL CITRATE 0.05 MG/ML IJ SOLN
50.0000 ug | INTRAMUSCULAR | Status: DC | PRN
  Administered 2012-12-07: 100 ug via INTRAVENOUS
  Filled 2012-12-07: qty 2

## 2012-12-07 MED ORDER — PANTOPRAZOLE SODIUM 40 MG IV SOLR
40.0000 mg | Freq: Every day | INTRAVENOUS | Status: DC
  Administered 2012-12-08 – 2012-12-10 (×3): 40 mg via INTRAVENOUS
  Filled 2012-12-07 (×4): qty 40

## 2012-12-07 MED ORDER — PROPOFOL 10 MG/ML IV EMUL
5.0000 ug/kg/min | INTRAVENOUS | Status: DC
  Administered 2012-12-07: 60 ug/kg/min via INTRAVENOUS
  Administered 2012-12-07: 20 ug/kg/min via INTRAVENOUS
  Administered 2012-12-08: 50 ug/kg/min via INTRAVENOUS
  Administered 2012-12-08: 20 ug/kg/min via INTRAVENOUS
  Administered 2012-12-08: 50 ug/kg/min via INTRAVENOUS
  Administered 2012-12-08: 20 ug/kg/min via INTRAVENOUS
  Administered 2012-12-09: 4 ug/kg/min via INTRAVENOUS
  Administered 2012-12-10: 10 ug/kg/min via INTRAVENOUS
  Filled 2012-12-07 (×9): qty 100

## 2012-12-07 MED ORDER — SODIUM CHLORIDE 0.9 % IV BOLUS (SEPSIS)
1000.0000 mL | Freq: Once | INTRAVENOUS | Status: AC
  Administered 2012-12-07: 1000 mL via INTRAVENOUS

## 2012-12-07 MED ORDER — MIDAZOLAM HCL 2 MG/2ML IJ SOLN
4.0000 mg | Freq: Once | INTRAMUSCULAR | Status: AC
  Administered 2012-12-07: 4 mg via INTRAVENOUS

## 2012-12-07 NOTE — Progress Notes (Signed)
[facility] PROGRESS NOTE  Victor Little MVH:[id number] DOB: [birth year] DOA: 12/05/2012 PCP: Pcp Not In System  Assessment/Plan: 1. Septic Shock - Due to C diff colitis - Will continue current IV antibiotic regimen (Metronidazole, vancomycin, zosyn) . WBC trending down - supportive care, will place on MIVF's since patient is npo - resolving  2. SBO - Reportedly on CT suspected to be 2ary to adhesions - Will continue to ambulate in room today - supportive therapy - repeat abdominal x ray this am - No active nausea or emesis as such NG tube not in but should this change will order ng tube placement. - At this point we'll try a trial of non-operative management given that SBO most likely secondary to adhesions based on CT findings and reports. We'll obtain pro calcitonin levels and lactic acid level and should condition deteriorates we'll plan on consulting general surgery for further evaluation and recommendations.  3. Sinus tachycardia/fever - Most likely due to infectious etiology and fever. Associated with elevated blood pressure too as such will add IV Labetalol 5 mg q 6 hrs prn elevated blood pressure with holding parameters.  4. c diff colitis - On Metronidazole IV and Vancomycin PR. Once able to tolerate diet will place on po vancomycin.  5. Hypotension - resolved and most likely due to # 1 - Last blood pressure elevated with systolic on monitor at > 145  6. ARF - Most likely 2ary to # 1, with etiology most likely due to hypovolemia and prerenal etiology. With increased maintenance IV fluids creatinine has trended down from 1.8-1.2. - We'll continue to monitor serum creatinine levels  7. Wheezing - Obtained BNP - Will obtain portable chest x-ray - Place an order for Xopenex when necessary SOB/wheezing   Code Status: full Family Communication: No family at bedside Disposition Plan: Pending continued improvement in condition. If SBO worse next am will plan on  consulting General surgery  Consultants:  None  Procedures:  None  Antibiotics:  AS listed above.   HPI/Subjective: Nursing reports that patient has been wheezing on and off. No new complaints overnight otherwise. Per discussion with nursing patient has not had any vomiting. Reportedly has been receiving fentanyl for pain which has effectively decreased his discomfort.  Objective: Filed Vitals:   12/07/12 0900  BP: 139/89  Pulse: 110  Temp: 99.3 F (37.4 C)  Resp: 21    Intake/Output Summary (Last 24 hours) at 12/07/12 0928 Last data filed at 12/07/12 0900  Gross per 24 hour  Intake 2727.5 ml  Output   1960 ml  Net  767.5 ml   Filed Weights   12/05/12 0917 12/05/12 1300 12/07/12 0400  Weight: 82.696 kg (182 lb 5 oz) 85.8 kg (189 lb 2.5 oz) 90 kg (198 lb 6.6 oz)    Exam:   General:  Pt in NAD, Alert and Awake  Cardiovascular: normal S1 and S2 with rapid rate (sinus on telemetry monitoring), no murmurs  Respiratory: CTA BL, no wheezes  Abdomen: soft, distended, pain on deep palpation  Musculoskeletal: no cyanosis or clubbing   Data Reviewed: Basic Metabolic Panel:  Recent Labs Lab 12/05/12 0747 12/05/12 1728 12/06/12 0330 12/07/12 0335  NA 141 139 138 138  K 4.5 4.2 4.3 4.1  CL 106 104 108 109  CO2 21 22 21 22   GLUCOSE 117* 101* 114* 131*  BUN 44* 45* 39* 25*  CREATININE 2.16* 1.98* 1.80* 1.27  CALCIUM 7.3* 7.0* 6.6* 7.1*   Liver Function Tests:  Recent  Labs Lab 12/05/12 0747 12/05/12 1728 12/06/12 0330  AST 36 36 31  ALT 18 18 15   ALKPHOS 72 70 65  BILITOT 0.9 0.8 0.6  PROT 5.7* 5.7* 5.3*  ALBUMIN 3.0* 2.8* 2.5*    Recent Labs Lab 12/05/12 0747  LIPASE 48   No results found for this basename: AMMONIA,  in the last 168 hours CBC:  Recent Labs Lab 12/05/12 0747 12/05/12 1728 12/06/12 0330 12/07/12 0335  WBC 27.7* 24.3* 18.5* 16.7*  NEUTROABS 25.2*  --   --   --   HGB 14.5 13.5 13.1 13.6  HCT 42.0 40.3 38.6* 39.5  MCV  85.7 85.6 86.4 85.5  PLT 190 207 170 178   Cardiac Enzymes: No results found for this basename: CKTOTAL, CKMB, CKMBINDEX, TROPONINI,  in the last 168 hours BNP (last 3 results) No results found for this basename: PROBNP,  in the last 8760 hours CBG: No results found for this basename: GLUCAP,  in the last 168 hours  Recent Results (from the past 240 hour(s))  CULTURE, BLOOD (ROUTINE X 2)     Status: None   Collection Time    12/05/12  8:43 AM      Result Value Range Status   Specimen Description BLOOD RIGHT HAND   Final   Special Requests BOTTLES DRAWN AEROBIC AND ANAEROBIC EA   Final   Culture  Setup Time     Final   Value: 12/05/2012 13:28     Performed at [facility]   Culture     Final   Value:        BLOOD CULTURE RECEIVED NO GROWTH TO DATE CULTURE WILL BE HELD FOR 5 DAYS BEFORE ISSUING A FINAL NEGATIVE REPORT     Performed at [facility]   Report Status PENDING   Incomplete  CULTURE, BLOOD (ROUTINE X 2)     Status: None   Collection Time    12/05/12  8:50 AM      Result Value Range Status   Specimen Description BLOOD LEFT WRIST   Final   Special Requests BOTTLES DRAWN AEROBIC AND ANAEROBIC 5CC EA   Final   Culture  Setup Time     Final   Value: 12/05/2012 13:28     Performed at [facility]   Culture     Final   Value:        BLOOD CULTURE RECEIVED NO GROWTH TO DATE CULTURE WILL BE HELD FOR 5 DAYS BEFORE ISSUING A FINAL NEGATIVE REPORT     Performed at [facility]   Report Status PENDING   Incomplete  URINE CULTURE     Status: None   Collection Time    12/05/12 10:58 AM      Result Value Range Status   Specimen Description URINE, CATHETERIZED   Final   Special Requests NONE   Final   Culture  Setup Time     Final   Value: 12/05/2012 15:33     Performed at [facility] Count     Final   Value: NO GROWTH     Performed at [facility]   Culture     Final   Value: NO GROWTH     Performed at [facility]   Report Status 12/06/2012 FINAL   Final  CLOSTRIDIUM DIFFICILE BY PCR     Status: Abnormal   Collection Time    12/05/12 12:38 PM  Result Value Range Status   C difficile by pcr POSITIVE (*) NEGATIVE Final   Comment: CRITICAL RESULT CALLED TO, READ BACK BY AND VERIFIED WITH:     AMorrie Sheldon RN 15:15 12/05/12 ([email or web address])     Performed at [facility]  MRSA PCR SCREENING     Status: None   Collection Time    12/05/12  1:26 PM      Result Value Range Status   MRSA by PCR NEGATIVE  NEGATIVE Final   Comment:            The GeneXpert MRSA Assay (FDA     approved for NASAL specimens     only), is one component of a     comprehensive MRSA colonization     surveillance program. It is not     intended to diagnose MRSA     infection nor to guide or     monitor treatment for     MRSA infections.     Studies: Ct Abdomen Pelvis Wo Contrast  12/05/2012   CLINICAL DATA:  Fatigue.  Diarrhea.  EXAM: CT ABDOMEN AND PELVIS WITHOUT CONTRAST  TECHNIQUE: Multidetector CT imaging of the abdomen and pelvis was performed following the standard protocol without intravenous contrast.  COMPARISON:  None.  FINDINGS: The patient has very small bilateral pleural effusions. There is no pericardial effusion. Heart size is normal. Contrast material is seen in the distal esophagus consistent with poor motility and/or reflux disease. Walls of the distal esophagus are mildly thickened. There is some dependent atelectasis in the lung bases, worse on the left.  Walls of the rectosigmoid colon appear thickened most consistent with colitis. There is a large amount of stool throughout the ascending and transverse colon. Radiopaque densities within the colon likely represent ingested medicine. The appendix is visualized and unremarkable. The stomach is distended and there are mildly dilated loops of small bowel measuring up to 3.1 cm with air-fluid levels identified. There is an abrupt change in the caliber  of small bowel in the right lower quadrant where a kinked loop is identified. See images 59-62. There is a trace amount of fluid in the right pericolic gutter.  The kidneys are atrophic bilaterally without focal lesion. The spleen, gallbladder, adrenal glands and pancreas appear normal. Retroaortic left renal vein is noted. There is no lymphadenopathy. No focal bony abnormality is identified.  IMPRESSION: Thickening of the walls of the rectosigmoid colon suggestive of colitis.  Findings worrisome for partial or early small bowel obstruction due to adhesions. No evidence of ischemia is present.  Small bilateral pleural effusions and basilar atelectasis.  Contrast in the distal esophagus compatible with esophageal reflux or dysmotility. Walls of the distal esophagus are mildly thickened suggesting esophagitis.   Electronically Signed   By: Drusilla Kanner M.D.   On: 12/05/2012 11:22   Dg Abd 1 View  12/06/2012   CLINICAL DATA:  Small bowel obstruction.  EXAM: ABDOMEN - 1 VIEW  COMPARISON:  CT scan dated 12/05/2012 and radiographs dated 10/01/2005  FINDINGS: There is increased gas in multiple distended loops small bowel. There is also increased air in the nondistended colon and stomach.  IMPRESSION: Persistent partial small bowel obstruction. Small bowel distention has increased.   Electronically Signed   By: Geanie Cooley M.D.   On: 12/06/2012 08:34    Scheduled Meds: . metronidazole  500 mg Intravenous Q8H  . piperacillin-tazobactam (ZOSYN)  IV  3.375 g Intravenous Q8H  . sodium chloride  3 mL  Intravenous Q12H  . vancomycin (VANCOCIN) rectal ENEMA  500 mg Rectal Q6H   Continuous Infusions: . dextrose 5 % and 0.45 % NaCl with KCl 20 mEq/L 1,000 mL (12/06/12 2000)    Principal Problem:   Septic shock Active Problems:   Diarrhea   Organic brain syndrome (chronic)   Deaf   ARF (acute renal failure)   Secondary hypotension   SBO (small bowel obstruction)   HTN (hypertension)   Neuropathy    Clostridium difficile colitis    Time spent: > 35 minutes    Penny Pia  [facility] Pager [phone]. If 7PM-7AM, please contact night-coverage at [email or web address], password [facility] 12/07/2012, 9:28 AM  LOS: 2 days

## 2012-12-07 NOTE — Consult Note (Signed)
Reason for Consult:Small bowel obstruction Referring Physician:  Dr. Unk Pinto is an 67 y.o. male.  HPI:   He was admitted 2 days ago with sepsis secondary to C. Diff colitis. CT scan demonstrated thickening of the rectosigmoid colon. There is also some dilated small bowel suggesting a possible partial small bowel obstruction. Earlier today, he vomited and aspirated. He has subsequently been intubated. Intestinal contents were noted below the vocal cords. An NG tube was placed and bilious output has been noted. I was asked to see him because of the possibility of partial small bowel obstruction.  He lives in a group home.  Past Medical History  Diagnosis Date  . Mental retardation   . Organic brain syndrome   . Hypertension   . Deaf     Past Surgical History  Procedure Laterality Date  . Orif right patella  09/23/2005  . Removal of right patella hardware  01/26/2006    History reviewed. No pertinent family history.  Social History:  reports that he has never smoked. He has never used smokeless tobacco. He reports that he does not drink alcohol or use illicit drugs.  Allergies: No Known Allergies  Current facility-administered medications:acetaminophen (TYLENOL) suppository 650 mg, 650 mg, Rectal, Q6H PRN, Hollice Espy, MD;  acetaminophen (TYLENOL) tablet 650 mg, 650 mg, Oral, Q6H PRN, Hollice Espy, MD;  Melene Muller ON 12/08/2012] antiseptic oral rinse (BIOTENE) solution 15 mL, 1 application, Mouth Rinse, QID, Konstantin Zubelevitskiy, MD chlorhexidine (PERIDEX) 0.12 % solution 15 mL, 15 mL, Mouth/Throat, BID, Konstantin Zubelevitskiy, MD;  dextrose 5 % and 0.45 % NaCl with KCl 20 mEq/L infusion, 1,000 mL, Intravenous, Continuous, Penny Pia, MD, Last Rate: 100 mL/hr at 12/07/12 1800, 1,000 mL at 12/07/12 1800;  famotidine (PEPCID) IVPB 20 mg, 20 mg, Intravenous, Q24H, Konstantin Zubelevitskiy, MD fentaNYL (SUBLIMAZE) 10 mcg/mL in sodium chloride 0.9 % 250 mL infusion,  25-400 mcg/hr, Intravenous, Continuous, Lonia Farber, MD, Last Rate: 7.5 mL/hr at 12/07/12 1900, 75 mcg/hr at 12/07/12 1900;  labetalol (NORMODYNE,TRANDATE) injection 5 mg, 5 mg, Intravenous, Q6H PRN, Penny Pia, MD, 5 mg at 12/06/12 1021;  levalbuterol (XOPENEX) nebulizer solution 0.63 mg, 0.63 mg, Nebulization, Q6H PRN, Penny Pia, MD metroNIDAZOLE (FLAGYL) IVPB 500 mg, 500 mg, Intravenous, Q8H, Hollice Espy, MD, 500 mg at 12/07/12 1600;  midazolam (VERSED) 2 MG/2ML injection, , , , ;  midazolam (VERSED) injection 4 mg, 4 mg, Intravenous, Once, Curlene Dolphin, MD;  ondansetron [facility]) injection 4 mg, 4 mg, Intravenous, Q6H PRN, Hollice Espy, MD piperacillin-tazobactam (ZOSYN) IVPB 3.375 g, 3.375 g, Intravenous, Q8H, Berkley Harvey, [facility], Last Rate: 12.5 mL/hr at 12/07/12 1800, 3.375 g at 12/07/12 1800;  propofol (DIPRIVAN) 10 mg/ml infusion, 5-70 mcg/kg/min, Intravenous, Continuous, Lonia Farber, MD, Last Rate: 21.6 mL/hr at 12/07/12 1900, 40 mcg/kg/min at 12/07/12 1900;  sodium chloride 0.9 % bolus 1,000 mL, 1,000 mL, Intravenous, PRN, Hollice Espy, MD sodium chloride 0.9 % bolus 1,000 mL, 1,000 mL, Intravenous, Once, Curlene Dolphin, MD;  sodium chloride 0.9 % injection 3 mL, 3 mL, Intravenous, Q12H, Hollice Espy, MD, 3 mL at 12/05/12 2009;  vancomycin (VANCOCIN) 500 mg in sodium chloride irrigation 0.9 % 100 mL ENEMA, 500 mg, Rectal, Q6H, Hollice Espy, MD, 500 mg at 12/07/12 1214   Results for orders placed during the hospital encounter of 12/05/12 (from the past 48 hour(s))  COMPREHENSIVE METABOLIC PANEL     Status: Abnormal   Collection Time  12/06/12  3:30 AM      Result Value Range   Sodium 138  135 - 145 mEq/L   Potassium 4.3  3.5 - 5.1 mEq/L   Chloride 108  96 - 112 mEq/L   CO2 21  19 - 32 mEq/L   Glucose, Bld 114 (*) 70 - 99 mg/dL   BUN 39 (*) 6 - 23 mg/dL   Creatinine, Ser 9.56 (*) 0.50 - 1.35 mg/dL   Calcium  6.6 (*) 8.4 - 10.5 mg/dL   Total Protein 5.3 (*) 6.0 - 8.3 g/dL   Albumin 2.5 (*) 3.5 - 5.2 g/dL   AST 31  0 - 37 U/L   ALT 15  0 - 53 U/L   Alkaline Phosphatase 65  39 - 117 U/L   Total Bilirubin 0.6  0.3 - 1.2 mg/dL   GFR calc non Af Amer 38 (*) >90 mL/min   GFR calc Af Amer 44 (*) >90 mL/min   Comment: (NOTE)     The eGFR has been calculated using the CKD EPI equation.     This calculation has not been validated in all clinical situations.     eGFR's persistently <90 mL/min signify possible Chronic Kidney     Disease.  CBC     Status: Abnormal   Collection Time    12/06/12  3:30 AM      Result Value Range   WBC 18.5 (*) 4.0 - 10.5 K/uL   RBC 4.47  4.22 - 5.81 MIL/uL   Hemoglobin 13.1  13.0 - 17.0 g/dL   HCT 21.3 (*) 08.6 - 57.8 %   MCV 86.4  78.0 - 100.0 fL   MCH 29.3  26.0 - 34.0 pg   MCHC 33.9  30.0 - 36.0 g/dL   RDW 46.9  62.9 - 52.8 %   Platelets 170  150 - 400 K/uL  CBC     Status: Abnormal   Collection Time    12/07/12  3:35 AM      Result Value Range   WBC 16.7 (*) 4.0 - 10.5 K/uL   RBC 4.62  4.22 - 5.81 MIL/uL   Hemoglobin 13.6  13.0 - 17.0 g/dL   HCT 41.3  24.4 - 01.0 %   MCV 85.5  78.0 - 100.0 fL   MCH 29.4  26.0 - 34.0 pg   MCHC 34.4  30.0 - 36.0 g/dL   RDW 27.2  53.6 - 64.4 %   Platelets 178  150 - 400 K/uL  BASIC METABOLIC PANEL     Status: Abnormal   Collection Time    12/07/12  3:35 AM      Result Value Range   Sodium 138  135 - 145 mEq/L   Potassium 4.1  3.5 - 5.1 mEq/L   Chloride 109  96 - 112 mEq/L   CO2 22  19 - 32 mEq/L   Glucose, Bld 131 (*) 70 - 99 mg/dL   BUN 25 (*) 6 - 23 mg/dL   Comment: DELTA CHECK NOTED     REPEATED TO VERIFY   Creatinine, Ser 1.27  0.50 - 1.35 mg/dL   Calcium 7.1 (*) 8.4 - 10.5 mg/dL   GFR calc non Af Amer 57 (*) >90 mL/min   GFR calc Af Amer 66 (*) >90 mL/min   Comment: (NOTE)     The eGFR has been calculated using the CKD EPI equation.     This calculation has not been validated in all clinical situations.  eGFR's persistently <90 mL/min signify possible Chronic Kidney     Disease.  PRO B NATRIURETIC PEPTIDE     Status: Abnormal   Collection Time    12/07/12  3:35 AM      Result Value Range   Pro B Natriuretic peptide (BNP) 680.8 (*) 0 - 125 pg/mL  PROCALCITONIN     Status: None   Collection Time    12/07/12  3:35 AM      Result Value Range   Procalcitonin 1.61     Comment:            Interpretation:     PCT > 0.5 ng/mL and <= 2 ng/mL:     Systemic infection (sepsis) is possible,     but other conditions are known to elevate     PCT as well.     (NOTE)             ICU PCT Algorithm               Non ICU PCT Algorithm        ----------------------------     ------------------------------             PCT < 0.25 ng/mL                 PCT < 0.1 ng/mL         Stopping of antibiotics            Stopping of antibiotics           strongly encouraged.               strongly encouraged.        ----------------------------     ------------------------------           PCT level decrease by               PCT < 0.25 ng/mL           >= 80% from peak PCT           OR PCT 0.25 - 0.5 ng/mL          Stopping of antibiotics                                                 encouraged.         Stopping of antibiotics               encouraged.        ----------------------------     ------------------------------           PCT level decrease by              PCT >= 0.25 ng/mL           < 80% from peak PCT            AND PCT >= 0.5 ng/mL            Continuing antibiotics                                                  encouraged.           Continuing antibiotics  encouraged.        ----------------------------     ------------------------------         PCT level increase compared          PCT > 0.5 ng/mL             with peak PCT AND              PCT >= 0.5 ng/mL             Escalation of antibiotics                                              strongly encouraged.          Escalation  of antibiotics            strongly encouraged.  LACTIC ACID, PLASMA     Status: None   Collection Time    12/07/12 10:00 AM      Result Value Range   Lactic Acid, Venous 1.3  0.5 - 2.2 mmol/L  BLOOD GAS, ARTERIAL     Status: Abnormal   Collection Time    12/07/12  6:00 PM      Result Value Range   FIO2 100.00     Delivery systems VENTILATOR     Mode PRESSURE REGULATED VOLUME CONTROL     VT 620     Rate 16     Peep/cpap 5.0     pH, Arterial 7.369  7.350 - 7.450   pCO2 arterial 38.8  35.0 - 45.0 mmHg   pO2, Arterial 197.0 (*) 80.0 - 100.0 mmHg   Bicarbonate 21.8  20.0 - 24.0 mEq/L   TCO2 19.3  0 - 100 mmol/L   Acid-base deficit 2.6 (*) 0.0 - 2.0 mmol/L   O2 Saturation 99.3     Patient temperature 98.6     Collection site RIGHT RADIAL     Drawn by [id number]     Sample type ARTERIAL DRAW     Allens test (pass/fail) PASS  PASS    Dg Abd 1 View  12/07/2012   CLINICAL DATA:  NG tube placement.  EXAM: ABDOMEN - 1 VIEW  COMPARISON:  12/07/2012  FINDINGS: NG tube crosses the gastroesophageal junction and it extends to the right of midline. Numerous loops of air-filled dilated small and large bowel.  IMPRESSION: NG tube by position expected to be within the distal stomach.   Electronically Signed   By: Esperanza Heir M.D.   On: 12/07/2012 17:51   Dg Abd 1 View  12/07/2012   CLINICAL DATA:  Evaluate for small bowel obstruction.  EXAM: ABDOMEN - 1 VIEW  COMPARISON:  Abdominal radiograph 12/06/2012.  FINDINGS: There is a small amount of colonic gas and stool noted. Diffuse dilatation of the small bowel, with multiple gas-filled loops throughout the entire abdomen, measuring up to 4.8 cm in diameter. No definite evidence of frank pneumoperitoneum. Stomach also appears distended with gas.  IMPRESSION: 1. Findings remain concerning for persistent partial small bowel obstruction, as discussed above. No significant change compared to the recent prior study.   Electronically Signed   By: Trudie Reed M.D.   On: 12/07/2012 11:26   Dg Abd 1 View  12/06/2012   CLINICAL DATA:  Small bowel obstruction.  EXAM: ABDOMEN - 1 VIEW  COMPARISON:  CT scan dated 12/05/2012 and radiographs dated  10/01/2005  FINDINGS: There is increased gas in multiple distended loops small bowel. There is also increased air in the nondistended colon and stomach.  IMPRESSION: Persistent partial small bowel obstruction. Small bowel distention has increased.   Electronically Signed   By: Geanie Cooley M.D.   On: 12/06/2012 08:34   Dg Chest Port 1 View  12/07/2012   CLINICAL DATA:  Intubation.  EXAM: PORTABLE CHEST - 1 VIEW  COMPARISON:  12/07/2012  FINDINGS: Endotracheal tube is approximately 6 cm above the carina. Bilateral lower lobe airspace opacities are noted, most likely atelectasis although infiltrate/pneumonia cannot be excluded at the right lung base. Heart is normal size. NG tube is seen entering the stomach.  IMPRESSION: Endotracheal tube of approximately 6 cm above the carina.  Bibasilar atelectasis or infiltrates, increasing on the right.   Electronically Signed   By: Charlett Nose M.D.   On: 12/07/2012 17:46   Dg Chest Port 1 View  12/07/2012   CLINICAL DATA:  Wheezing.  EXAM: PORTABLE CHEST - 1 VIEW  COMPARISON:  March 07, 2007.  FINDINGS: Hypoinflation of the lungs is noted. Linear opacity is noted in left lung base consistent with subsegmental atelectasis. No pneumothorax is noted. Cardiomediastinal silhouette appears normal. Bony thorax appears intact.  IMPRESSION: Left basilar opacity most consistent with subsegmental atelectasis or less likely pneumonia.   Electronically Signed   By: Roque Lias M.D.   On: 12/07/2012 11:13    Review of Systems  Unable to perform ROS: intubated   Blood pressure 101/60, pulse 121, temperature 101.8 F (38.8 C), temperature source Core (Comment), resp. rate 16, height 6' (1.829 m), weight 198 lb 6.6 oz (90 kg), SpO2 97.00%. Physical Exam  Constitutional: He appears  well-developed and well-nourished.  sedated  Eyes: No scleral icterus.  GI: Soft. He exhibits distension (Mild). He exhibits no mass. There is no tenderness.  No umbilical hernia   Genitourinary:  No inguinal hernia.  Neurological:  sedated  Skin: Skin is warm and dry.    Assessment/Plan: 1. Clostridium difficile colitis.  2. Ileus versus partial small bowel obstruction  3. Acute aspiration and respiratory failure.  Recommendation: Continue NG tube decompression. I suspect this may be more of an ileus that is secondary to the C. Difficile colitis.  Will repeat abdominal x-ray in the morning.  Elfego Giammarino J 12/07/2012, 8:46 PM

## 2012-12-07 NOTE — Progress Notes (Signed)
Late entry: 1635  Had been signing with patient regarding walking around unit and patient agreeable.  Appeared to be in no distress.  Patient rolled onto right side to attempt to get OOB and this RN signed "wait" to him.  At that time, patient gagged and began projectile vomiting large amount (approx 2 liters) of green emesis with sediment.  Mouth suctioned with Meryl Dare as patient continued to vomit.  Patient's O2 saturations began to drop while on 2L nasal cannula, and O2 titrated up to 6L.  Patient's O2 saturations remained in the low 70s, and 100% non-rebreather mask applied which patient would not tolerate.  O2 saturations reached low of 67% before slowly rising.  Dr. Cena Benton notified and ordered NG tube placement.  Dr. Marin Shutter camered into room via [facility] and made decision to have patient intubated by anesthesia due to O2 sats not rising to normal limits and patient in obvious respiratory distress.  Patient intubated by anesthesia with no issues.  NG placed and dark green emesis returned.  Patient awake, agitated, Propofol and Fentanyl drips initiated and wrist restraints applied to prevent patient from pulling out necessary equipment.  Patient's caregiver Cala Bradford updated by phone.  Will continue to monitor.

## 2012-12-07 NOTE — Consult Note (Signed)
PULMONARY  / CRITICAL CARE MEDICINE  Name: Victor Little MRN: [id number] DOB: 1945/04/24    ADMISSION DATE:  12/05/2012 CONSULTATION DATE:  12/07/12  REFERRING MD :  Penny Pia, MD PRIMARY SERVICE: PCCM  CHIEF COMPLAINT:  Acute respiratory failure.   BRIEF PATIENT DESCRIPTION:  67 years old male resident of a nursing home with PMH relevant for mental retardation, deafness, HTN. Admitted initially by Internal Medicine with C. Diff colitis and possible SBO vs severe ileus. Aspirated and developed respiratory failure requiring intubation.   SIGNIFICANT EVENTS / STUDIES:  Chest X ray: new RLL infiltrates.   LINES / TUBES: Lef IJ CVC 12/07/12  CULTURES: Blood cultures ordered  ANTIBIOTICS: - Zosyn - IV Flagyl - Rectal vancomycin  HISTORY OF PRESENT ILLNESS:   67 years old male resident of a nursing home with PMH relevant for mental retardation, deafness, HTN. Admitted initially by Internal Medicine with C. Diff colitis and possible SBO. Was getting treatment with IV Flagyl and Vancomycin enemas. Today he had an episode of large volume emesis with aspiration. He became hypoxic requiring endotracheal intubation. Critical care consult called to assist with management. At the time of my exam the patient is awake, agitated, tachycardic, hypertensive, dyssynchronous with the vent. The patient is unable to follow commands due to deafness. Moves all 4 extremities.    PAST MEDICAL HISTORY :  Past Medical History  Diagnosis Date  . Mental retardation   . Organic brain syndrome   . Hypertension   . Deaf    Past Surgical History  Procedure Laterality Date  . Orif right patella  09/23/2005  . Removal of right patella hardware  01/26/2006   Prior to Admission medications   Medication Sig Start Date End Date Taking? Authorizing Provider  amLODipine (NORVASC) 10 MG tablet Take 1 tablet by mouth daily. 11/16/12  Yes Historical Provider, MD  benazepril (LOTENSIN) 20 MG tablet Take 1 tablet  by mouth daily. 11/16/12  Yes Historical Provider, MD  furosemide (LASIX) 20 MG tablet Take 1 tablet by mouth daily. 11/16/12  Yes Historical Provider, MD  gabapentin (NEURONTIN) 300 MG capsule Take 1 capsule by mouth 3 (three) times daily. 11/16/12  Yes Historical Provider, MD  hyoscyamine (LEVBID) 0.375 MG 12 hr tablet Take 0.375 mg by mouth 2 (two) times daily.   Yes Historical Provider, MD  loratadine (CLARITIN) 10 MG tablet Take 10 mg by mouth daily.   Yes Historical Provider, MD  meloxicam (MOBIC) 7.5 MG tablet Take 1 tablet by mouth daily. 11/16/12  Yes Historical Provider, MD  Probiotic Product (PROBIOTIC FORMULA PO) Take 1 capsule by mouth daily.   Yes Historical Provider, MD  risperiDONE (RISPERDAL) 1 MG tablet Take 1 tablet by mouth daily. 11/16/12  Yes Historical Provider, MD   No Known Allergies  FAMILY HISTORY:  History reviewed. No pertinent family history. SOCIAL HISTORY:  reports that he has never smoked. He has never used smokeless tobacco. He reports that he does not drink alcohol or use illicit drugs.  REVIEW OF SYSTEMS:  Unable to obtain.   SUBJECTIVE:   VITAL SIGNS: Temp:  [99.3 F (37.4 C)-101.8 F (38.8 C)] 100.4 F (38 C) (11/22 2135) Pulse Rate:  [75-154] 112 (11/22 2135) Resp:  [14-28] 16 (11/22 2135) BP: (79-170)/(56-105) 105/77 mmHg (11/22 2130) SpO2:  [73 %-100 %] 99 % (11/22 2135) FiO2 (%):  [60 %-100 %] 60 % (11/22 2055) Weight:  [198 lb 6.6 oz (90 kg)] 198 lb 6.6 oz (90 kg) (11/22 0400) HEMODYNAMICS:  VENTILATOR SETTINGS: Vent Mode:  [-] PRVC FiO2 (%):  [60 %-100 %] 60 % Set Rate:  [16 bmp] 16 bmp Vt Set:  [620 mL] 620 mL PEEP:  [5 cmH20] 5 cmH20 Plateau Pressure:  [17 cmH20-23 cmH20] 17 cmH20 INTAKE / OUTPUT: Intake/Output     11/22 0701 - 11/23 0700   I.V. (mL/kg) 1483.6 (16.5)   Other    IV Piggyback 1300   Total Intake(mL/kg) 2783.6 (30.9)   Urine (mL/kg/hr) 710 (0.5)   Emesis/NG output 200 (0.1)   Total Output 910   Net +1873.6          PHYSICAL EXAMINATION: General: Agitated, intubated Eyes: Anicteric sclerae. ENT: ETT in place. Trachea at midline. Lymph: No cervical, supraclavicular, or axillary lymphadenopathy. Heart: Normal S1, S2. No murmurs, rubs, or gallops appreciated. No bruits, equal pulses. Lungs: Left lung clear to auscultation. Right lung with diminished breath sounds and crackles to the base. Abdomen: Abdomen with mild distention. No hepatosplenomegaly or masses. Musculoskeletal: No clubbing or synovitis. Skin: No rashes or lesions Neuro: Agitated. Moves all 4 extremities. Deaf.   LABS:  CBC  Recent Labs Lab 12/05/12 1728 12/06/12 0330 12/07/12 0335  WBC 24.3* 18.5* 16.7*  HGB 13.5 13.1 13.6  HCT 40.3 38.6* 39.5  PLT 207 170 178   Coag's No results found for this basename: APTT, INR,  in the last 168 hours BMET  Recent Labs Lab 12/05/12 1728 12/06/12 0330 12/07/12 0335  NA 139 138 138  K 4.2 4.3 4.1  CL 104 108 109  CO2 22 21 22   BUN 45* 39* 25*  CREATININE 1.98* 1.80* 1.27  GLUCOSE 101* 114* 131*   Electrolytes  Recent Labs Lab 12/05/12 1728 12/06/12 0330 12/07/12 0335  CALCIUM 7.0* 6.6* 7.1*   Sepsis Markers  Recent Labs Lab 12/05/12 0758 12/05/12 0843 12/07/12 0335 12/07/12 1000  LATICACIDVEN 3.16*  --   --  1.3  PROCALCITON  --  5.28 1.61  --    ABG  Recent Labs Lab 12/07/12 1800  PHART 7.369  PCO2ART 38.8  PO2ART 197.0*   Liver Enzymes  Recent Labs Lab 12/05/12 0747 12/05/12 1728 12/06/12 0330  AST 36 36 31  ALT 18 18 15   ALKPHOS 72 70 65  BILITOT 0.9 0.8 0.6  ALBUMIN 3.0* 2.8* 2.5*   Cardiac Enzymes  Recent Labs Lab 12/07/12 0335  PROBNP 680.8*   Glucose No results found for this basename: GLUCAP,  in the last 168 hours  Imaging Dg Abd 1 View  12/07/2012   CLINICAL DATA:  NG tube placement.  EXAM: ABDOMEN - 1 VIEW  COMPARISON:  12/07/2012  FINDINGS: NG tube crosses the gastroesophageal junction and it extends to the right of  midline. Numerous loops of air-filled dilated small and large bowel.  IMPRESSION: NG tube by position expected to be within the distal stomach.   Electronically Signed   By: Esperanza Heir M.D.   On: 12/07/2012 17:51   Dg Abd 1 View  12/07/2012   CLINICAL DATA:  Evaluate for small bowel obstruction.  EXAM: ABDOMEN - 1 VIEW  COMPARISON:  Abdominal radiograph 12/06/2012.  FINDINGS: There is a small amount of colonic gas and stool noted. Diffuse dilatation of the small bowel, with multiple gas-filled loops throughout the entire abdomen, measuring up to 4.8 cm in diameter. No definite evidence of frank pneumoperitoneum. Stomach also appears distended with gas.  IMPRESSION: 1. Findings remain concerning for persistent partial small bowel obstruction, as discussed above. No significant change compared  to the recent prior study.   Electronically Signed   By: Trudie Reed M.D.   On: 12/07/2012 11:26   Dg Abd 1 View  12/06/2012   CLINICAL DATA:  Small bowel obstruction.  EXAM: ABDOMEN - 1 VIEW  COMPARISON:  CT scan dated 12/05/2012 and radiographs dated 10/01/2005  FINDINGS: There is increased gas in multiple distended loops small bowel. There is also increased air in the nondistended colon and stomach.  IMPRESSION: Persistent partial small bowel obstruction. Small bowel distention has increased.   Electronically Signed   By: Geanie Cooley M.D.   On: 12/06/2012 08:34   Dg Chest Port 1 View  12/07/2012   CLINICAL DATA:  Line placement  EXAM: PORTABLE CHEST - 1 VIEW  COMPARISON:  12/07/2012  FINDINGS: Left internal jugular central line placed with tip over the superior vena cava just below the level of the azygos arch. No pneumothorax. Increased consolidation throughout the right lung.  IMPRESSION: Central line as described. Other support devices stable. Increased right lung consolidation.   Electronically Signed   By: Esperanza Heir M.D.   On: 12/07/2012 21:08   Dg Chest Port 1 View  12/07/2012   CLINICAL  DATA:  Intubation.  EXAM: PORTABLE CHEST - 1 VIEW  COMPARISON:  12/07/2012  FINDINGS: Endotracheal tube is approximately 6 cm above the carina. Bilateral lower lobe airspace opacities are noted, most likely atelectasis although infiltrate/pneumonia cannot be excluded at the right lung base. Heart is normal size. NG tube is seen entering the stomach.  IMPRESSION: Endotracheal tube of approximately 6 cm above the carina.  Bibasilar atelectasis or infiltrates, increasing on the right.   Electronically Signed   By: Charlett Nose M.D.   On: 12/07/2012 17:46   Dg Chest Port 1 View  12/07/2012   CLINICAL DATA:  Wheezing.  EXAM: PORTABLE CHEST - 1 VIEW  COMPARISON:  March 07, 2007.  FINDINGS: Hypoinflation of the lungs is noted. Linear opacity is noted in left lung base consistent with subsegmental atelectasis. No pneumothorax is noted. Cardiomediastinal silhouette appears normal. Bony thorax appears intact.  IMPRESSION: Left basilar opacity most consistent with subsegmental atelectasis or less likely pneumonia.   Electronically Signed   By: Roque Lias M.D.   On: 12/07/2012 11:13     CXR:  - ETT in adequate position - Left IJ in adequate position - RLL infiltrate  ASSESSMENT / PLAN:  PULMONARY A: 1) Acute hypoxemic respiratory failure secondary to aspiration P:   - Mechanical ventilation   - PRVC, Vt: 8cc/kg, PEEP: 5, RR: 16, FiO2: 100% and adjust to keep O2 sat > 94%   - VAP prevention order set   - Daily awakening and SBT - Zosyn  CARDIOVASCULAR A:  1) Hypotension likely secondary to sedation and volume depletion. P:  - NS bolus 1L - Pressors if needed. I suspect his BP will respond to fluids   RENAL A:   1) Acute renal failure improving P:   - Will continue IVF resuscitation. - Follow UP CMP in am  GASTROINTESTINAL A:   1) C. Diff colitis 2) SBO vs ileus P:   - Surgery input appreciated - NGT to suction - PPI  HEMATOLOGIC A:   1) No issues P:  - SQ heparin - Will  follow CBC  INFECTIOUS A:   1) Aspiration pneumonits / pneumonia. Spiked a fever.  2) C. Diff colitis P:   - Zosyn - IV Flagyl - Rectal Vancomycin  ENDOCRINE A:   1)  No issues   NEUROLOGIC A:   1) Mental retardation 2) Deafness 3) Sedation P:   - Will optimize sedation to RASS 0 to -1  - Propofol  - Fentanyl   I have personally obtained a history, examined the patient, evaluated laboratory and imaging results, formulated the assessment and plan and placed orders. CRITICAL CARE: Critical Care Time devoted to patient care services described in this note is 60 minutes.   Overton Mam, MD Pulmonary and Critical Care Medicine [facility] Pager: [phone]  12/07/2012, 10:53 PM

## 2012-12-07 NOTE — Procedures (Signed)
Central Venous Catheter Insertion Procedure Note Victor Little [id number] 10-17-1945  Procedure: Insertion of Central Venous Catheter Indications: Assessment of intravascular volume, Drug and/or fluid administration and Frequent blood sampling  Procedure Details Consent: Unable to obtain consent because of emergent medical necessity. Time Out: Verified patient identification, verified procedure, site/side was marked, verified correct patient position, special equipment/implants available, medications/allergies/relevent history reviewed, required imaging and test results available.  Performed  Maximum sterile technique was used including antiseptics, cap, gloves, gown, hand hygiene, mask and sheet. Skin prep: Chlorhexidine; local anesthetic administered A antimicrobial bonded/coated triple lumen catheter was placed in the left internal jugular vein using the Seldinger technique.  Evaluation Blood flow good Complications: No apparent complications Patient did tolerate procedure well. Chest X-ray ordered to verify placement.  CXR: normal.  Overton Mam, MD. Pulmonary and Critical Care Medicine Call E-link with questions [phone] 12/07/2012, 10:31 PM

## 2012-12-07 NOTE — Progress Notes (Addendum)
eLink Physician-Brief Progress Note Patient Name: Victor Little DOB: 10/09/1945 MRN: [id number]  Date of Service  12/07/2012   HPI/Events of Note   Projectile vomiting Hypoxia Respiratory distress   eICU Interventions   Anesthesia to intubate Vent orders Sedation ABG PCXR NGT to Sx VAP Px GI Px    Intervention Category Major Interventions: Airway management;Respiratory failure - evaluation and management  Migel Hannis 12/07/2012, 4:50 PM

## 2012-12-08 ENCOUNTER — Inpatient Hospital Stay (HOSPITAL_COMMUNITY): Payer: PRIVATE HEALTH INSURANCE

## 2012-12-08 DIAGNOSIS — J9600 Acute respiratory failure, unspecified whether with hypoxia or hypercapnia: Secondary | ICD-10-CM

## 2012-12-08 DIAGNOSIS — J9601 Acute respiratory failure with hypoxia: Secondary | ICD-10-CM | POA: Diagnosis present

## 2012-12-08 DIAGNOSIS — K5289 Other specified noninfective gastroenteritis and colitis: Secondary | ICD-10-CM

## 2012-12-08 DIAGNOSIS — H9190 Unspecified hearing loss, unspecified ear: Secondary | ICD-10-CM

## 2012-12-08 LAB — CBC
HCT: 34.4 % — ABNORMAL LOW (ref 39.0–52.0)
Hemoglobin: 11.9 g/dL — ABNORMAL LOW (ref 13.0–17.0)
MCV: 84.7 fL (ref 78.0–100.0)
RBC: 4.06 MIL/uL — ABNORMAL LOW (ref 4.22–5.81)
WBC: 12.4 10*3/uL — ABNORMAL HIGH (ref 4.0–10.5)

## 2012-12-08 LAB — COMPREHENSIVE METABOLIC PANEL
Albumin: 2 g/dL — ABNORMAL LOW (ref 3.5–5.2)
BUN: 20 mg/dL (ref 6–23)
Calcium: 7 mg/dL — ABNORMAL LOW (ref 8.4–10.5)
Chloride: 110 mEq/L (ref 96–112)
GFR calc Af Amer: 76 mL/min — ABNORMAL LOW (ref >90)
Glucose, Bld: 115 mg/dL — ABNORMAL HIGH (ref 70–99)
Potassium: 4.1 mEq/L (ref 3.5–5.1)
Total Bilirubin: 1.1 mg/dL (ref 0.3–1.2)
Total Protein: 4.9 g/dL — ABNORMAL LOW (ref 6.0–8.3)

## 2012-12-08 MED ORDER — SODIUM CHLORIDE 0.9 % IV BOLUS (SEPSIS)
1000.0000 mL | Freq: Once | INTRAVENOUS | Status: AC
  Administered 2012-12-08: 1000 mL via INTRAVENOUS

## 2012-12-08 MED ORDER — DEXTROSE-NACL 5-0.9 % IV SOLN
INTRAVENOUS | Status: DC
  Administered 2012-12-08 – 2012-12-09 (×2): via INTRAVENOUS

## 2012-12-08 MED ORDER — SODIUM CHLORIDE 0.9 % IV BOLUS (SEPSIS)
500.0000 mL | Freq: Once | INTRAVENOUS | Status: AC
  Administered 2012-12-08: 500 mL via INTRAVENOUS

## 2012-12-08 NOTE — Progress Notes (Signed)
eLink Physician-Brief Progress Note Patient Name: JOANATHAN AFFELDT DOB: September 15, 1945 MRN: [id number]  Date of Service  12/08/2012   HPI/Events of Note   Oliguria with low CVP 6  eICU Interventions  NS bolus Dc K in fluids   Intervention Category Intermediate Interventions: Oliguria - evaluation and management  Jemarcus Dougal V. 12/08/2012, 5:03 PM

## 2012-12-08 NOTE — Progress Notes (Signed)
ANTIBIOTIC CONSULT NOTE - Follow up  Pharmacy Consult for Zosyn Indication: rule out sepsis, aspiration PNA  No Known Allergies  Patient Measurements: Height: 6' (182.9 cm) Weight: 195 lb 8.8 oz (88.7 kg) IBW/kg (Calculated) : 77.6  Vital Signs: Temp: 99 F (37.2 C) (11/23 1330) Temp src: Core (Comment) (11/23 1200) BP: 83/51 mmHg (11/23 1330) Pulse Rate: 96 (11/23 1330) Intake/Output from previous day: 11/22 0701 - 11/23 0700 In: 4058.9 [I.V.:2071.4; IV Piggyback:1987.5] Out: 2780 [Urine:1160; Emesis/NG output:1600; Stool:20] Intake/Output from this shift: Total I/O In: 971.9 [I.V.:834.4; IV Piggyback:137.5] Out: 745 [Urine:245; Emesis/NG output:500]  Labs:  Recent Labs  12/06/12 0330 12/07/12 0335 12/08/12 0630  WBC 18.5* 16.7* 12.4*  HGB 13.1 13.6 11.9*  PLT 170 178 193  CREATININE 1.80* 1.27 1.14   Estimated Creatinine Clearance: 70 ml/min (by C-G formula based on Cr of 1.14). No results found for this basename: VANCOTROUGH, VANCOPEAK, VANCORANDOM, GENTTROUGH, GENTPEAK, GENTRANDOM, TOBRATROUGH, TOBRAPEAK, TOBRARND, AMIKACINPEAK, AMIKACINTROU, AMIKACIN,  in the last 72 hours   Microbiology: Recent Results (from the past 720 hour(s))  CULTURE, BLOOD (ROUTINE X 2)     Status: None   Collection Time    12/05/12  8:43 AM      Result Value Range Status   Specimen Description BLOOD RIGHT HAND   Final   Special Requests BOTTLES DRAWN AEROBIC AND ANAEROBIC EA   Final   Culture  Setup Time     Final   Value: 12/05/2012 13:28     Performed at [facility]   Culture     Final   Value:        BLOOD CULTURE RECEIVED NO GROWTH TO DATE CULTURE WILL BE HELD FOR 5 DAYS BEFORE ISSUING A FINAL NEGATIVE REPORT     Performed at [facility]   Report Status PENDING   Incomplete  CULTURE, BLOOD (ROUTINE X 2)     Status: None   Collection Time    12/05/12  8:50 AM      Result Value Range Status   Specimen Description BLOOD LEFT WRIST   Final   Special  Requests BOTTLES DRAWN AEROBIC AND ANAEROBIC 5CC EA   Final   Culture  Setup Time     Final   Value: 12/05/2012 13:28     Performed at [facility]   Culture     Final   Value:        BLOOD CULTURE RECEIVED NO GROWTH TO DATE CULTURE WILL BE HELD FOR 5 DAYS BEFORE ISSUING A FINAL NEGATIVE REPORT     Performed at [facility]   Report Status PENDING   Incomplete  URINE CULTURE     Status: None   Collection Time    12/05/12 10:58 AM      Result Value Range Status   Specimen Description URINE, CATHETERIZED   Final   Special Requests NONE   Final   Culture  Setup Time     Final   Value: 12/05/2012 15:33     Performed at [facility] Count     Final   Value: NO GROWTH     Performed at [facility]   Culture     Final   Value: NO GROWTH     Performed at [facility]   Report Status 12/06/2012 FINAL   Final  CLOSTRIDIUM DIFFICILE BY PCR     Status: Abnormal   Collection Time    12/05/12 12:38 PM  Result Value Range Status   C difficile by pcr POSITIVE (*) NEGATIVE Final   Comment: CRITICAL RESULT CALLED TO, READ BACK BY AND VERIFIED WITH:     AMorrie Sheldon RN 15:15 12/05/12 ([email or web address])     Performed at [facility]  MRSA PCR SCREENING     Status: None   Collection Time    12/05/12  1:26 PM      Result Value Range Status   MRSA by PCR NEGATIVE  NEGATIVE Final   Comment:            The GeneXpert MRSA Assay (FDA     approved for NASAL specimens     only), is one component of a     comprehensive MRSA colonization     surveillance program. It is not     intended to diagnose MRSA     infection nor to guide or     monitor treatment for     MRSA infections.   Medications:  Scheduled:  . antiseptic oral rinse  1 application Mouth Rinse QID  . chlorhexidine  15 mL Mouth/Throat BID  . famotidine (PEPCID) IV  20 mg Intravenous Q24H  . metronidazole  500 mg Intravenous Q8H  . pantoprazole (PROTONIX) IV  40 mg Intravenous Daily  .  piperacillin-tazobactam (ZOSYN)  IV  3.375 g Intravenous Q8H  . sodium chloride  3 mL Intravenous Q12H  . vancomycin (VANCOCIN) rectal ENEMA  500 mg Rectal Q6H   Assessment: 67 yo with hx HTN, mental retardation, deafness who presented to ER from group home with 12 hours of vomiting and diarrhea and initial BP of 81/50. Patient also with elevated lactic acid and procalcitonin. Md ordered broad spectrum abx's for possible sepsis (appendicitis vs colitis vs UTI vs viral illness per ER Md)  11/20 Vancomycin ordered & discontinued, Zosyn begun for r/o sepsis  CDiff positive: added Flagyl, rectal Vancomycin  Renal function much improved, now wnl  Aspiration of emesis 11/22 pm, required intubation  Goal of Therapy:  Treatment of infection Abx dose/schedule appropriate for renal function Plan po Vancomycin when able  Plan:   No change to Zosyn (day 4)  Continuing Flagyl IV, pr Vancomycin  Monitor clinical course with aspiration  Otho Bellows PharmD Pager [phone] 12/08/2012, 1:44 PM

## 2012-12-08 NOTE — Progress Notes (Signed)
Clinical Social Work Department BRIEF PSYCHOSOCIAL ASSESSMENT 12/08/2012  Patient:  Victor Little, Victor Little     Account Number:  [id number]     Admit date:  12/05/2012  Clinical Social Worker:  Victor Little  Date/Time:  12/08/2012 11:39 AM  Referred by:  Physician  Date Referred:  12/08/2012 Referred for  Other - See comment   Other Referral:   Return to Group Home   Interview type:  Other - See comment Other interview type:   Pt's guardian, Mrs. Veto Kemps.    PSYCHOSOCIAL DATA Living Status:  OTHER Admitted from facility:   Level of care:   Primary support name:  [employer] Primary support relationship to patient:  NONE Degree of support available:   Adequate    CURRENT CONCERNS Current Concerns  Post-Acute Placement   Other Concerns:    SOCIAL WORK ASSESSMENT / PLAN Pt lives at [facility], directed by [employer].    Spoke with Mrs. Roxan Hockey re: Pt's return upon d/c.  Mrs. Roxan Hockey stated that Pt is able to return and that she will need an FL2, at that time.    Mrs. Roxan Hockey provided CSW with Pt's guardian's information: Corinne Ports, [phone].    CSW thanked Mrs. Roxan Hockey for her time.    Spoke with Mrs. Veto Kemps, Pt's guardian, via phone.    Mrs. Veto Kemps confirmed that Pt is from [facility] and that he will return there upon d/c.  She stated that she is happy with Pt's care there.    Mrs. Veto Kemps asked that CSW coordinate Pt's return to [facility] with Mrs. Roxan Hockey, Catering manager.    CSW thanked Mrs. [name] for her time.   Assessment/plan status:  Psychosocial Support/Ongoing Assessment of Needs Other assessment/ plan:   Information/referral to community resources:   n/a    PATIENT'S/FAMILY'S RESPONSE TO PLAN OF CARE: Mrs. [name]'s response to Pt's plan of care was positive. She stated that she is happy for him to return there.    Mrs. Veto Kemps was pleasant and happy to speak with CSW.    Mrs. Veto Kemps thanked CSW for time and assistance.    Providence Crosby, LCSWA Clinical Social Work [phone]

## 2012-12-08 NOTE — Progress Notes (Signed)
Subjective: Sedated on vent.  Objective: Vital signs in last 24 hours: Temp:  [96.6 F (35.9 C)-101.8 F (38.8 C)] 97.5 F (36.4 C) (11/23 0645) Pulse Rate:  [86-154] 95 (11/23 0645) Resp:  [12-28] 16 (11/23 0645) BP: (64-170)/(38-105) 95/54 mmHg (11/23 0645) SpO2:  [73 %-100 %] 99 % (11/23 0645) FiO2 (%):  [40 %-100 %] 40 % (11/23 0505) Weight:  [195 lb 8.8 oz (88.7 kg)] 195 lb 8.8 oz (88.7 kg) (11/23 0615) Last BM Date: 12/07/12 (Flexiseal )  Intake/Output from previous day: 11/22 0701 - 11/23 0700 In: 3938.9 [I.V.:1951.4; IV Piggyback:1987.5] Out: 2780 [Urine:1160; Emesis/NG output:1600; Stool:20] Intake/Output this shift:    PE: General- Sedated. Abdomen-slightly firm and distended, hypoactive bowel sounds  Lab Results:   Recent Labs  12/07/12 0335 12/08/12 0630  WBC 16.7* 12.4*  HGB 13.6 11.9*  HCT 39.5 34.4*  PLT 178 193   BMET  Recent Labs  12/07/12 0335 12/08/12 0630  NA 138 141  K 4.1 4.1  CL 109 110  CO2 22 22  GLUCOSE 131* 115*  BUN 25* 20  CREATININE 1.27 1.14  CALCIUM 7.1* 7.0*   PT/INR No results found for this basename: LABPROT, INR,  in the last 72 hours Comprehensive Metabolic Panel:    Component Value Date/Time   NA 141 12/08/2012 0630   K 4.1 12/08/2012 0630   CL 110 12/08/2012 0630   CO2 22 12/08/2012 0630   BUN 20 12/08/2012 0630   CREATININE 1.14 12/08/2012 0630   GLUCOSE 115* 12/08/2012 0630   CALCIUM 7.0* 12/08/2012 0630   AST 24 12/08/2012 0630   ALT 12 12/08/2012 0630   ALKPHOS 62 12/08/2012 0630   BILITOT 1.1 12/08/2012 0630   PROT 4.9* 12/08/2012 0630   ALBUMIN 2.0* 12/08/2012 0630     Studies/Results: Dg Abd 1 View  12/07/2012   CLINICAL DATA:  NG tube placement.  EXAM: ABDOMEN - 1 VIEW  COMPARISON:  12/07/2012  FINDINGS: NG tube crosses the gastroesophageal junction and it extends to the right of midline. Numerous loops of air-filled dilated small and large bowel.  IMPRESSION: NG tube by position expected  to be within the distal stomach.   Electronically Signed   By: Esperanza Heir M.D.   On: 12/07/2012 17:51   Dg Abd 1 View  12/07/2012   CLINICAL DATA:  Evaluate for small bowel obstruction.  EXAM: ABDOMEN - 1 VIEW  COMPARISON:  Abdominal radiograph 12/06/2012.  FINDINGS: There is a small amount of colonic gas and stool noted. Diffuse dilatation of the small bowel, with multiple gas-filled loops throughout the entire abdomen, measuring up to 4.8 cm in diameter. No definite evidence of frank pneumoperitoneum. Stomach also appears distended with gas.  IMPRESSION: 1. Findings remain concerning for persistent partial small bowel obstruction, as discussed above. No significant change compared to the recent prior study.   Electronically Signed   By: Trudie Reed M.D.   On: 12/07/2012 11:26   Dg Abd 1 View  12/06/2012   CLINICAL DATA:  Small bowel obstruction.  EXAM: ABDOMEN - 1 VIEW  COMPARISON:  CT scan dated 12/05/2012 and radiographs dated 10/01/2005  FINDINGS: There is increased gas in multiple distended loops small bowel. There is also increased air in the nondistended colon and stomach.  IMPRESSION: Persistent partial small bowel obstruction. Small bowel distention has increased.   Electronically Signed   By: Geanie Cooley M.D.   On: 12/06/2012 08:34   Dg Chest Port 1 View  12/07/2012  CLINICAL DATA:  Line placement  EXAM: PORTABLE CHEST - 1 VIEW  COMPARISON:  12/07/2012  FINDINGS: Left internal jugular central line placed with tip over the superior vena cava just below the level of the azygos arch. No pneumothorax. Increased consolidation throughout the right lung.  IMPRESSION: Central line as described. Other support devices stable. Increased right lung consolidation.   Electronically Signed   By: Esperanza Heir M.D.   On: 12/07/2012 21:08   Dg Chest Port 1 View  12/07/2012   CLINICAL DATA:  Intubation.  EXAM: PORTABLE CHEST - 1 VIEW  COMPARISON:  12/07/2012  FINDINGS: Endotracheal tube is  approximately 6 cm above the carina. Bilateral lower lobe airspace opacities are noted, most likely atelectasis although infiltrate/pneumonia cannot be excluded at the right lung base. Heart is normal size. NG tube is seen entering the stomach.  IMPRESSION: Endotracheal tube of approximately 6 cm above the carina.  Bibasilar atelectasis or infiltrates, increasing on the right.   Electronically Signed   By: Charlett Nose M.D.   On: 12/07/2012 17:46   Dg Chest Port 1 View  12/07/2012   CLINICAL DATA:  Wheezing.  EXAM: PORTABLE CHEST - 1 VIEW  COMPARISON:  March 07, 2007.  FINDINGS: Hypoinflation of the lungs is noted. Linear opacity is noted in left lung base consistent with subsegmental atelectasis. No pneumothorax is noted. Cardiomediastinal silhouette appears normal. Bony thorax appears intact.  IMPRESSION: Left basilar opacity most consistent with subsegmental atelectasis or less likely pneumonia.   Electronically Signed   By: Roque Lias M.D.   On: 12/07/2012 11:13    Anti-infectives: Anti-infectives   Start     Dose/Rate Route Frequency Ordered Stop   12/06/12 0600  vancomycin (VANCOCIN) 1,250 mg in sodium chloride 0.9 % 250 mL IVPB  Status:  Discontinued     1,250 mg 166.7 mL/hr over 90 Minutes Intravenous Every 24 hours 12/05/12 1006 12/05/12 1628   12/05/12 1800  vancomycin (VANCOCIN) 500 mg in sodium chloride irrigation 0.9 % 100 mL ENEMA     500 mg Rectal 4 times per day 12/05/12 1424 12/19/12 1759   12/05/12 1600  metroNIDAZOLE (FLAGYL) IVPB 500 mg     500 mg 100 mL/hr over 60 Minutes Intravenous Every 8 hours 12/05/12 1424 12/19/12 1559   12/05/12 1430  vancomycin (VANCOCIN) 50 mg/mL oral solution 500 mg  Status:  Discontinued     500 mg Per Tube 4 times per day 12/05/12 1424 12/05/12 1425   12/05/12 1015  piperacillin-tazobactam (ZOSYN) IVPB 3.375 g     3.375 g 12.5 mL/hr over 240 Minutes Intravenous Every 8 hours 12/05/12 1006     12/05/12 0845  vancomycin (VANCOCIN) IVPB 1000  mg/200 mL premix     1,000 mg 200 mL/hr over 60 Minutes Intravenous STAT 12/05/12 0830 12/05/12 1017   12/05/12 0830  piperacillin-tazobactam (ZOSYN) IVPB 3.375 g     3.375 g 100 mL/hr over 30 Minutes Intravenous STAT 12/05/12 0806 12/05/12 1017   12/05/12 0815  piperacillin-tazobactam (ZOSYN) IVPB 3.375 g  Status:  Discontinued     3.375 g 12.5 mL/hr over 240 Minutes Intravenous  Once 12/05/12 0804 12/05/12 0805   12/05/12 0815  vancomycin (VANCOCIN) IVPB 1000 mg/200 mL premix  Status:  Discontinued     1,000 mg 200 mL/hr over 60 Minutes Intravenous  Once 12/05/12 0813 12/05/12 0813      Assessment Ileus vs partial SBO secondary to C. Diff coliltis Sepsis  Aspiration and VDRF   LOS: 3  days   Plan: Continue ng decompression.  No indication for an operation at this time.   Sunny Gains J 12/08/2012

## 2012-12-08 NOTE — Progress Notes (Signed)
INITIAL NUTRITION ASSESSMENT  DOCUMENTATION CODES Per approved criteria  -Not Applicable   INTERVENTION: Monitor plan of care.  TF vs TPN based on GI status if unable to extubate.  NUTRITION DIAGNOSIS: Inadequate oral intake related to altered GI function as evidenced by npo status and vent.   Goal: Extubation with diet advancement vs tpn.  Monitor:  Plan of care, labs, weight trend  Reason for Assessment: vent  67 y.o. male  Admitting Dx: Septic shock  ASSESSMENT: 67 years old male resident of a group home and adult day care during the day with PMH relevant for mental retardation, deafness, HTN. Admitted initially by Internal Medicine with C. Diff colitis and possible SBO vs ileus. Was getting treatment with IV Flagyl and Vancomycin enemas. Today he had an episode of large volume emesis with aspiration. He became hypoxic requiring endotracheal intubation.    Appears well nourished on admit.  Height: Ht Readings from Last 1 Encounters:  12/07/12 6' (1.829 m)    Weight: Wt Readings from Last 1 Encounters:  12/08/12 195 lb 8.8 oz (88.7 kg)    Ideal Body Weight:  178 lbs  % Ideal Body Weight: 110  Wt Readings from Last 10 Encounters:  12/08/12 195 lb 8.8 oz (88.7 kg)    Usual Body Weight: unknown  % Usual Body Weight: unknown  BMI:  Body mass index is 26.52 kg/(m^2).  Estimated Nutritional Needs: Kcal: 1898 Protein: 95-105 gm Fluid: 2.2-2.3L daily  Skin: wnl  Diet Order: NPO  EDUCATION NEEDS: -No education needs identified at this time   Intake/Output Summary (Last 24 hours) at 12/08/12 1031 Last data filed at 12/08/12 0900  Gross per 24 hour  Intake 4110.52 ml  Output   2705 ml  Net 1405.52 ml    Last BM: 11/22  Labs:   Recent Labs Lab 12/06/12 0330 12/07/12 0335 12/08/12 0630  NA 138 138 141  K 4.3 4.1 4.1  CL 108 109 110  CO2 21 22 22   BUN 39* 25* 20  CREATININE 1.80* 1.27 1.14  CALCIUM 6.6* 7.1* 7.0*  GLUCOSE 114* 131* 115*     CBG (last 3)  No results found for this basename: GLUCAP,  in the last 72 hours  Scheduled Meds: . antiseptic oral rinse  1 application Mouth Rinse QID  . chlorhexidine  15 mL Mouth/Throat BID  . famotidine (PEPCID) IV  20 mg Intravenous Q24H  . metronidazole  500 mg Intravenous Q8H  . pantoprazole (PROTONIX) IV  40 mg Intravenous Daily  . piperacillin-tazobactam (ZOSYN)  IV  3.375 g Intravenous Q8H  . sodium chloride  3 mL Intravenous Q12H  . vancomycin (VANCOCIN) rectal ENEMA  500 mg Rectal Q6H    Continuous Infusions: . dextrose 5 % and 0.45 % NaCl with KCl 20 mEq/L 1,000 mL (12/08/12 0800)  . fentaNYL infusion INTRAVENOUS 25 mcg/hr (12/08/12 0900)  . propofol 20 mcg/kg/min (12/08/12 0900)    Past Medical History  Diagnosis Date  . Mental retardation   . Organic brain syndrome   . Hypertension   . Deaf     Past Surgical History  Procedure Laterality Date  . Orif right patella  09/23/2005  . Removal of right patella hardware  01/26/2006    Oran Rein, RD, LDN Clinical Inpatient Dietitian Pager:  [phone] Weekend and after hours pager:  [phone]

## 2012-12-08 NOTE — Progress Notes (Signed)
Patient hypotensive with low urine output.  MD notified and new orders obtained.  Will continue to monitor.

## 2012-12-08 NOTE — Progress Notes (Signed)
PULMONARY  / CRITICAL CARE MEDICINE  Name: Victor Little MRN: [id number] DOB: September 13, 1945    ADMISSION DATE:  12/05/2012 CONSULTATION DATE:  12/07/12  REFERRING MD :  Penny Pia, MD PRIMARY SERVICE: PCCM  CHIEF COMPLAINT:  Acute respiratory failure.   BRIEF PATIENT DESCRIPTION:  67 years old male resident of a nursing home with PMH relevant for mental retardation, deafness, HTN. Admitted initially by Internal Medicine with C. Diff colitis and possible SBO. Was getting treatment with IV Flagyl and Vancomycin enemas. Today he had an episode of large volume emesis with aspiration. He became hypoxic requiring endotracheal intubation. Critical care consult called to assist with management. At the time of my exam the patient is awake, agitated, tachycardic, hypertensive, dyssynchronous with the vent. The patient is unable to follow commands due to deafness. Moves all 4 extremitie  SIGNIFICANT EVENTS / STUDIES:  Chest X ray: new RLL infiltrates.   LINES / TUBES: Lef IJ CVC 12/07/12  CULTURES: Blood MRSA PCR p- neg Urine - neg C Diff PCR - Positive  ANTIBIOTICS: - Zosyn - IV Flagyl - Rectal vancomycin    SUBJECTIVE/OVERNIGHT/INTERVAL HX   12/08/12: never on pressors. Agitaed on WUA due to deaf/mute and low IQ status.   VITAL SIGNS: Temp:  [96.6 F (35.9 C)-101.8 F (38.8 C)] 98.1 F (36.7 C) (11/23 0830) Pulse Rate:  [86-154] 95 (11/23 0830) Resp:  [12-28] 16 (11/23 0830) BP: (64-170)/(38-105) 100/69 mmHg (11/23 0830) SpO2:  [73 %-100 %] 99 % (11/23 0830) FiO2 (%):  [40 %-100 %] 40 % (11/23 0821) Weight:  [88.7 kg (195 lb 8.8 oz)] 88.7 kg (195 lb 8.8 oz) (11/23 0615) HEMODYNAMICS:   VENTILATOR SETTINGS: Vent Mode:  [-] PRVC FiO2 (%):  [40 %-100 %] 40 % Set Rate:  [16 bmp] 16 bmp Vt Set:  [620 mL] 620 mL PEEP:  [5 cmH20] 5 cmH20 Plateau Pressure:  [17 cmH20-23 cmH20] 18 cmH20 INTAKE / OUTPUT: Intake/Output     11/22 0701 - 11/23 0700 11/23 0701 - 11/24 0700    I.V. (mL/kg) 2071.4 (23.4) 113.3 (1.3)   Other     IV Piggyback 1987.5 100   Total Intake(mL/kg) 4058.9 (45.8) 213.3 (2.4)   Urine (mL/kg/hr) 1160 (0.5)    Emesis/NG output 1600 (0.8)    Stool 20 (0)    Total Output 2780     Net +1278.9 +213.3          PHYSICAL EXAMINATION: General: [place] critically ill on vent. Sedated Eyes: Anicteric sclerae. ENT: ETT in place. Trachea at midline. Lymph: No cervical, supraclavicular, or axillary lymphadenopathy. Heart: Normal S1, S2. No murmurs, rubs, or gallops appreciated. No bruits, equal pulses. Lungs: Left lung clear to auscultation. Right lung with diminished breath sounds and crackles to the base. Sync with vent Abdomen: Abdomen with mild distention. No hepatosplenomegaly or masses. Soft. No mass Musculoskeletal: No clubbing or synovitis. Skin: No rashes or lesions Neuro: Agitated on WIA. Moves all 4 extremities. Deaf. LABS: PULMONARY  Recent Labs Lab 12/07/12 1800  PHART 7.369  PCO2ART 38.8  PO2ART 197.0*  HCO3 21.8  TCO2 19.3  O2SAT 99.3    CBC  Recent Labs Lab 12/06/12 0330 12/07/12 0335 12/08/12 0630  HGB 13.1 13.6 11.9*  HCT 38.6* 39.5 34.4*  WBC 18.5* 16.7* 12.4*  PLT 170 178 193    COAGULATION No results found for this basename: INR,  in the last 168 hours  CARDIAC  No results found for this basename: TROPONINI,  in the last 168  hours  Recent Labs Lab 12/07/12 0335  PROBNP 680.8*     CHEMISTRY  Recent Labs Lab 12/05/12 0747 12/05/12 1728 12/06/12 0330 12/07/12 0335 12/08/12 0630  NA 141 139 138 138 141  K 4.5 4.2 4.3 4.1 4.1  CL 106 104 108 109 110  CO2 21 22 21 22 22   GLUCOSE 117* 101* 114* 131* 115*  BUN 44* 45* 39* 25* 20  CREATININE 2.16* 1.98* 1.80* 1.27 1.14  CALCIUM 7.3* 7.0* 6.6* 7.1* 7.0*   Estimated Creatinine Clearance: 70 ml/min (by C-G formula based on Cr of 1.14).   LIVER  Recent Labs Lab 12/05/12 0747 12/05/12 1728 12/06/12 0330 12/08/12 0630  AST 36 36 31 24   ALT 18 18 15 12   ALKPHOS 72 70 65 62  BILITOT 0.9 0.8 0.6 1.1  PROT 5.7* 5.7* 5.3* 4.9*  ALBUMIN 3.0* 2.8* 2.5* 2.0*     INFECTIOUS  Recent Labs Lab 12/05/12 0758 12/05/12 0843 12/07/12 0335 12/07/12 1000  LATICACIDVEN 3.16*  --   --  1.3  PROCALCITON  --  5.28 1.61  --      ENDOCRINE CBG (last 3)  No results found for this basename: GLUCAP,  in the last 72 hours       IMAGING x48h  Dg Abd 1 View  12/08/2012   CLINICAL DATA:  Small bowel obstruction, abdominal distention.  EXAM: ABDOMEN - 1 VIEW  COMPARISON:  December 07, 2012.  FINDINGS: Nasogastric tube is seen in the expected position of the distal stomach or proximal duodenum. There is continued presence of dilated loops of large and small bowel. No significant change is noted. No abnormal calcifications noted.  IMPRESSION: Nasogastric tube is unchanged in position. Stable large and small bowel dilatation compared to prior exam.   Electronically Signed   By: Roque Lias M.D.   On: 12/08/2012 08:52   Dg Abd 1 View  12/07/2012   CLINICAL DATA:  NG tube placement.  EXAM: ABDOMEN - 1 VIEW  COMPARISON:  12/07/2012  FINDINGS: NG tube crosses the gastroesophageal junction and it extends to the right of midline. Numerous loops of air-filled dilated small and large bowel.  IMPRESSION: NG tube by position expected to be within the distal stomach.   Electronically Signed   By: Esperanza Heir M.D.   On: 12/07/2012 17:51   Dg Abd 1 View  12/07/2012   CLINICAL DATA:  Evaluate for small bowel obstruction.  EXAM: ABDOMEN - 1 VIEW  COMPARISON:  Abdominal radiograph 12/06/2012.  FINDINGS: There is a small amount of colonic gas and stool noted. Diffuse dilatation of the small bowel, with multiple gas-filled loops throughout the entire abdomen, measuring up to 4.8 cm in diameter. No definite evidence of frank pneumoperitoneum. Stomach also appears distended with gas.  IMPRESSION: 1. Findings remain concerning for persistent partial  small bowel obstruction, as discussed above. No significant change compared to the recent prior study.   Electronically Signed   By: Trudie Reed M.D.   On: 12/07/2012 11:26   Dg Chest Port 1 View  12/07/2012   CLINICAL DATA:  Line placement  EXAM: PORTABLE CHEST - 1 VIEW  COMPARISON:  12/07/2012  FINDINGS: Left internal jugular central line placed with tip over the superior vena cava just below the level of the azygos arch. No pneumothorax. Increased consolidation throughout the right lung.  IMPRESSION: Central line as described. Other support devices stable. Increased right lung consolidation.   Electronically Signed   By: Edgar Frisk.D.  On: 12/07/2012 21:08   Dg Chest Port 1 View  12/07/2012   CLINICAL DATA:  Intubation.  EXAM: PORTABLE CHEST - 1 VIEW  COMPARISON:  12/07/2012  FINDINGS: Endotracheal tube is approximately 6 cm above the carina. Bilateral lower lobe airspace opacities are noted, most likely atelectasis although infiltrate/pneumonia cannot be excluded at the right lung base. Heart is normal size. NG tube is seen entering the stomach.  IMPRESSION: Endotracheal tube of approximately 6 cm above the carina.  Bibasilar atelectasis or infiltrates, increasing on the right.   Electronically Signed   By: Charlett Nose M.D.   On: 12/07/2012 17:46   Dg Chest Port 1 View  12/07/2012   CLINICAL DATA:  Wheezing.  EXAM: PORTABLE CHEST - 1 VIEW  COMPARISON:  March 07, 2007.  FINDINGS: Hypoinflation of the lungs is noted. Linear opacity is noted in left lung base consistent with subsegmental atelectasis. No pneumothorax is noted. Cardiomediastinal silhouette appears normal. Bony thorax appears intact.  IMPRESSION: Left basilar opacity most consistent with subsegmental atelectasis or less likely pneumonia.   Electronically Signed   By: Roque Lias M.D.   On: 12/07/2012 11:13        ASSESSMENT / PLAN:  PULMONARY A: 1) Acute hypoxemic respiratory failure secondary to  aspiration  12/08/12: Failure to do sbot due to agitation P:   - Mechanical ventilation   - PRVC, Vt: 8cc/kg, PEEP: 5, RR: 16, FiO2: 100% and adjust to keep O2 sat > 94%   - VAP prevention order set   - Daily awakening and SBT - Zosyn  CARDIOVASCULAR A:  1) Hypotension likely secondary to sedation and volume depletion. 12/08/12: Not on pressors. REsonded to fluids P:  - MAP goal > 65 - CVP . 10 - contnue d5 half normal with K   RENAL A:   1) Acute renal failure improving 12/08/12: renal failur and lacti acidosis resolved  P:   - Will continue IVF resuscitation. - Follow UP CMP in am  GASTROINTESTINAL A:   1) C. Diff colitis 2) SBO vs ileus  12/08/12: No a candidate for surgery per CCS  P:   - Surgery input appreciated - NGT to suction - PPI  HEMATOLOGIC A:   1) No issues P:  - SQ heparin - Will follow CBC - PRBC for hgb < 7gm%  INFECTIOUS A:   1) Aspiration pneumonits / pneumonia. Spiked a fever.  2) C. Diff colitis P:   - Zosyn - IV Flagyl - Rectal Vancomycin  ENDOCRINE A:   1) No issues   NEUROLOGIC A:   1) Mental retardation 2) Deafness 3) Sedation  11/23/134: Above causing problems with SBT P:   - Will optimize sedation to RASS 0 to -1  - Propofol  - Fentanyl   GLOBAL 12/08/12: No family at bedside. Only contact is group home director with # in chart; not contacted on this day  The patient is critically ill with multiple organ systems failure and requires high complexity decision making for assessment and support, frequent evaluation and titration of therapies, application of advanced monitoring technologies and extensive interpretation of multiple databases.   Critical Care Time devoted to patient care services described in this note is  35  Minutes.  Dr. Kalman Shan, M.D., [facility].C.P Pulmonary and Critical Care Medicine Staff Physician [facility] Pager: [phone], If no  answer or between  15:00h - 7:00h: call [phone]  12/08/2012 9:20 AM

## 2012-12-08 NOTE — Progress Notes (Signed)
During the night pt has been unstable at times, requiring TLC in RIJ via CCM MD.  Agitated requiring sedation in order to tolerate ventilator.  Spoke with [facility], MD r/t pt low b/p new orders received and completed.  Will continue to monitor and report.

## 2012-12-09 ENCOUNTER — Inpatient Hospital Stay (HOSPITAL_COMMUNITY): Payer: PRIVATE HEALTH INSURANCE

## 2012-12-09 LAB — CBC WITH DIFFERENTIAL/PLATELET
Basophils Absolute: 0 10*3/uL (ref 0.0–0.1)
Basophils Relative: 0 % (ref 0–1)
Eosinophils Absolute: 0.5 10*3/uL (ref 0.0–0.7)
HCT: 36.2 % — ABNORMAL LOW (ref 39.0–52.0)
Lymphocytes Relative: 8 % — ABNORMAL LOW (ref 12–46)
MCHC: 34 g/dL (ref 30.0–36.0)
Neutro Abs: 12.2 10*3/uL — ABNORMAL HIGH (ref 1.7–7.7)
Neutrophils Relative %: 80 % — ABNORMAL HIGH (ref 43–77)
Platelets: 201 10*3/uL (ref 150–400)
RDW: 14.3 % (ref 11.5–15.5)
WBC: 15.3 10*3/uL — ABNORMAL HIGH (ref 4.0–10.5)

## 2012-12-09 LAB — BASIC METABOLIC PANEL
BUN: 15 mg/dL (ref 6–23)
CO2: 21 mEq/L (ref 19–32)
Calcium: 7.2 mg/dL — ABNORMAL LOW (ref 8.4–10.5)
Creatinine, Ser: 1.15 mg/dL (ref 0.50–1.35)
GFR calc Af Amer: 75 mL/min — ABNORMAL LOW (ref >90)
GFR calc non Af Amer: 65 mL/min — ABNORMAL LOW (ref >90)
Sodium: 143 mEq/L (ref 135–145)

## 2012-12-09 LAB — PHOSPHORUS: Phosphorus: 2.1 mg/dL — ABNORMAL LOW (ref 2.3–4.6)

## 2012-12-09 LAB — MAGNESIUM: Magnesium: 1.9 mg/dL (ref 1.5–2.5)

## 2012-12-09 MED ORDER — VANCOMYCIN 50 MG/ML ORAL SOLUTION
500.0000 mg | Freq: Four times a day (QID) | ORAL | Status: AC
  Administered 2012-12-09 – 2012-12-18 (×35): 500 mg
  Filled 2012-12-09 (×39): qty 10

## 2012-12-09 MED ORDER — SODIUM PHOSPHATE 3 MMOLE/ML IV SOLN
30.0000 mmol | Freq: Once | INTRAVENOUS | Status: AC
  Administered 2012-12-09: 30 mmol via INTRAVENOUS
  Filled 2012-12-09: qty 10

## 2012-12-09 MED ORDER — ACETAMINOPHEN 160 MG/5ML PO SOLN
650.0000 mg | Freq: Four times a day (QID) | ORAL | Status: DC | PRN
  Administered 2012-12-09: 650 mg
  Filled 2012-12-09: qty 20.3

## 2012-12-09 NOTE — Progress Notes (Signed)
12/09/2012 Colleen Can BSN RN CCM [phone] Conc review completed. Pt is intubated and on ventilator. Discharge plans are for return to grp home. CSW following.

## 2012-12-09 NOTE — Progress Notes (Signed)
eLink Physician-Brief Progress Note Patient Name: Victor Little DOB: January 27, 1945 MRN: [id number]  Date of Service  12/09/2012   HPI/Events of Note  Hypophosphatemia   eICU Interventions  Phos replaced   Intervention Category Minor Interventions: Electrolytes abnormality - evaluation and management  [name] 12/09/2012, 6:03 AM

## 2012-12-09 NOTE — Progress Notes (Signed)
Patient ID: Victor Little, male   DOB: 06/25/1945, 67 y.o.   MRN: [id number]    Subjective: Pt intubated.  No grimace with abdominal palpation.    Objective: Vital signs in last 24 hours: Temp:  [97.9 F (36.6 C)-100.4 F (38 C)] 99.1 F (37.3 C) (11/24 0700) Pulse Rate:  [87-126] 87 (11/24 0700) Resp:  [14-20] 16 (11/24 0700) BP: (68-150)/(47-100) 96/54 mmHg (11/24 0700) SpO2:  [95 %-100 %] 99 % (11/24 0700) FiO2 (%):  [40 %] 40 % (11/24 0338) Last BM Date: 12/08/12  Intake/Output from previous day: 11/23 0701 - 11/24 0700 In: 5430.5 [I.V.:2695.5; IV Piggyback:2735] Out: 1790 [Urine:890; Emesis/NG output:900] Intake/Output this shift: Total I/O In: 100 [IV Piggyback:100] Out: -   PE: Abd: soft, but some distention, hypoactive BS, doesn't seem tender to palpation.  flexi-seal in place with minimal output.  Lab Results:   Recent Labs  12/08/12 0630 12/09/12 0530  WBC 12.4* 15.3*  HGB 11.9* 12.3*  HCT 34.4* 36.2*  PLT 193 201   BMET  Recent Labs  12/08/12 0630 12/09/12 0530  NA 141 143  K 4.1 4.0  CL 110 115*  CO2 22 21  GLUCOSE 115* 95  BUN 20 15  CREATININE 1.14 1.15  CALCIUM 7.0* 7.2*   PT/INR No results found for this basename: LABPROT, INR,  in the last 72 hours CMP     Component Value Date/Time   NA 143 12/09/2012 0530   K 4.0 12/09/2012 0530   CL 115* 12/09/2012 0530   CO2 21 12/09/2012 0530   GLUCOSE 95 12/09/2012 0530   BUN 15 12/09/2012 0530   CREATININE 1.15 12/09/2012 0530   CALCIUM 7.2* 12/09/2012 0530   PROT 4.9* 12/08/2012 0630   ALBUMIN 2.0* 12/08/2012 0630   AST 24 12/08/2012 0630   ALT 12 12/08/2012 0630   ALKPHOS 62 12/08/2012 0630   BILITOT 1.1 12/08/2012 0630   GFRNONAA 65* 12/09/2012 0530   GFRAA 75* 12/09/2012 0530   Lipase     Component Value Date/Time   LIPASE 48 12/05/2012 0747       Studies/Results: Dg Abd 1 View  12/08/2012   CLINICAL DATA:  Small bowel obstruction, abdominal distention.  EXAM:  ABDOMEN - 1 VIEW  COMPARISON:  December 07, 2012.  FINDINGS: Nasogastric tube is seen in the expected position of the distal stomach or proximal duodenum. There is continued presence of dilated loops of large and small bowel. No significant change is noted. No abnormal calcifications noted.  IMPRESSION: Nasogastric tube is unchanged in position. Stable large and small bowel dilatation compared to prior exam.   Electronically Signed   By: Roque Lias M.D.   On: 12/08/2012 08:52   Dg Abd 1 View  12/07/2012   CLINICAL DATA:  NG tube placement.  EXAM: ABDOMEN - 1 VIEW  COMPARISON:  12/07/2012  FINDINGS: NG tube crosses the gastroesophageal junction and it extends to the right of midline. Numerous loops of air-filled dilated small and large bowel.  IMPRESSION: NG tube by position expected to be within the distal stomach.   Electronically Signed   By: Esperanza Heir M.D.   On: 12/07/2012 17:51   Dg Abd 1 View  12/07/2012   CLINICAL DATA:  Evaluate for small bowel obstruction.  EXAM: ABDOMEN - 1 VIEW  COMPARISON:  Abdominal radiograph 12/06/2012.  FINDINGS: There is a small amount of colonic gas and stool noted. Diffuse dilatation of the small bowel, with multiple gas-filled loops throughout the entire abdomen,  measuring up to 4.8 cm in diameter. No definite evidence of frank pneumoperitoneum. Stomach also appears distended with gas.  IMPRESSION: 1. Findings remain concerning for persistent partial small bowel obstruction, as discussed above. No significant change compared to the recent prior study.   Electronically Signed   By: Trudie Reed M.D.   On: 12/07/2012 11:26   Dg Chest Port 1 View  12/09/2012   CLINICAL DATA:  Endotracheal tube tip position  EXAM: PORTABLE CHEST - 1 VIEW  COMPARISON:  Multiple recent previous exams.  FINDINGS: 0507 hours. Endotracheal tube tip is 8.2 cm above the base of the carina. The NG tube passes into the stomach although the distal tip position is not included on the  film. Left IJ central line tip projects at the level of the innominate vein confluence.  Interval progression of right greater than left airspace disease with bibasilar atelectasis and small, right greater than left, pleural effusions.  Telemetry leads overlie the chest.  IMPRESSION: Slight progression of the diffuse airspace disease in the right lung.  Endotracheal tube tip has pulled back with tip now 8.2 cm above the carina.   Electronically Signed   By: Kennith Center M.D.   On: 12/09/2012 07:19   Dg Chest Port 1 View  12/08/2012   CLINICAL DATA:  Respiratory failure and aspiration pneumonia.  EXAM: PORTABLE CHEST - 1 VIEW  COMPARISON:  12/07/2012  FINDINGS: Endotracheal tube tip is approximately 7 cm above the carina. Central line positioning is stable. Nasogastric tube extends below the diaphragm. Lungs show airspace disease on the right which could be due to aspiration. No pulmonary edema or pleural effusion is identified. The heart size is stable.  IMPRESSION: Right lung airspace disease.   Electronically Signed   By: Irish Lack M.D.   On: 12/08/2012 09:27   Dg Chest Port 1 View  12/07/2012   CLINICAL DATA:  Line placement  EXAM: PORTABLE CHEST - 1 VIEW  COMPARISON:  12/07/2012  FINDINGS: Left internal jugular central line placed with tip over the superior vena cava just below the level of the azygos arch. No pneumothorax. Increased consolidation throughout the right lung.  IMPRESSION: Central line as described. Other support devices stable. Increased right lung consolidation.   Electronically Signed   By: Esperanza Heir M.D.   On: 12/07/2012 21:08   Dg Chest Port 1 View  12/07/2012   CLINICAL DATA:  Intubation.  EXAM: PORTABLE CHEST - 1 VIEW  COMPARISON:  12/07/2012  FINDINGS: Endotracheal tube is approximately 6 cm above the carina. Bilateral lower lobe airspace opacities are noted, most likely atelectasis although infiltrate/pneumonia cannot be excluded at the right lung base. Heart is  normal size. NG tube is seen entering the stomach.  IMPRESSION: Endotracheal tube of approximately 6 cm above the carina.  Bibasilar atelectasis or infiltrates, increasing on the right.   Electronically Signed   By: Charlett Nose M.D.   On: 12/07/2012 17:46   Dg Chest Port 1 View  12/07/2012   CLINICAL DATA:  Wheezing.  EXAM: PORTABLE CHEST - 1 VIEW  COMPARISON:  March 07, 2007.  FINDINGS: Hypoinflation of the lungs is noted. Linear opacity is noted in left lung base consistent with subsegmental atelectasis. No pneumothorax is noted. Cardiomediastinal silhouette appears normal. Bony thorax appears intact.  IMPRESSION: Left basilar opacity most consistent with subsegmental atelectasis or less likely pneumonia.   Electronically Signed   By: Roque Lias M.D.   On: 12/07/2012 11:13   Dg Abd Portable  1v  12/09/2012   CLINICAL DATA:  Ileus.  C difficile colitis.  EXAM: PORTABLE ABDOMEN - 1 VIEW  COMPARISON:  12/08/2012.  FINDINGS: NG tube in stable position. Persistent dilated loops of small and large bowel are noted. These findings are most consistent adynamic ileus. No evidence of free air noted. Degenerative changes lumbar spine. Bibasilar atelectasis and/or infiltrates.  IMPRESSION: 1. Persistent unchanged dilated loops of small and large bowel most consistent with adynamic ileus. NG tube in stable position. 2. Bibasilar atelectasis and/or infiltrates.   Electronically Signed   By: Maisie Fus  Register   On: 12/09/2012 07:13    Anti-infectives: Anti-infectives   Start     Dose/Rate Route Frequency Ordered Stop   12/06/12 0600  vancomycin (VANCOCIN) 1,250 mg in sodium chloride 0.9 % 250 mL IVPB  Status:  Discontinued     1,250 mg 166.7 mL/hr over 90 Minutes Intravenous Every 24 hours 12/05/12 1006 12/05/12 1628   12/05/12 1800  vancomycin (VANCOCIN) 500 mg in sodium chloride irrigation 0.9 % 100 mL ENEMA     500 mg Rectal 4 times per day 12/05/12 1424 12/19/12 1759   12/05/12 1600  metroNIDAZOLE  (FLAGYL) IVPB 500 mg     500 mg 100 mL/hr over 60 Minutes Intravenous Every 8 hours 12/05/12 1424 12/19/12 1559   12/05/12 1430  vancomycin (VANCOCIN) 50 mg/mL oral solution 500 mg  Status:  Discontinued     500 mg Per Tube 4 times per day 12/05/12 1424 12/05/12 1425   12/05/12 1015  piperacillin-tazobactam (ZOSYN) IVPB 3.375 g     3.375 g 12.5 mL/hr over 240 Minutes Intravenous Every 8 hours 12/05/12 1006     12/05/12 0845  vancomycin (VANCOCIN) IVPB 1000 mg/200 mL premix     1,000 mg 200 mL/hr over 60 Minutes Intravenous STAT 12/05/12 0830 12/05/12 1017   12/05/12 0830  piperacillin-tazobactam (ZOSYN) IVPB 3.375 g     3.375 g 100 mL/hr over 30 Minutes Intravenous STAT 12/05/12 0806 12/05/12 1017   12/05/12 0815  piperacillin-tazobactam (ZOSYN) IVPB 3.375 g  Status:  Discontinued     3.375 g 12.5 mL/hr over 240 Minutes Intravenous  Once 12/05/12 0804 12/05/12 0805   12/05/12 0815  vancomycin (VANCOCIN) IVPB 1000 mg/200 mL premix  Status:  Discontinued     1,000 mg 200 mL/hr over 60 Minutes Intravenous  Once 12/05/12 0813 12/05/12 0813       Assessment/Plan  1. Adynamic ileus Patient Active Problem List   Diagnosis Date Noted  . Acute respiratory failure with hypoxia 12/08/2012  . Wheezes 12/07/2012  . Septic shock 12/05/2012  . Diarrhea 12/05/2012  . 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 Deaf 12/05/2012  . ARF (acute renal failure) 12/05/2012  . Secondary hypotension 12/05/2012  . SBO (small bowel obstruction) 12/05/2012  . HTN (hypertension) 12/05/2012  . Neuropathy 12/05/2012  . Clostridium difficile colitis 12/05/2012   Plan: 1. Patient appears to have findings more c/w an adynamic ileus.  Cont current care for now.  He has an NGT in place and a flexi-seal in place.  I suspect as his C diff improves and his overall condition improves his bowel function will as well.  Will cont to follow.   LOS: 4 days    Anthony Tamburo E 12/09/2012, 8:07 AM Pager:  [phone]

## 2012-12-09 NOTE — Progress Notes (Signed)
General surgery attending note:  I have interviewed and examined this patient this morning. I reviewed the x-ray findings and discussed his care with Ms. Earl Gala PA.  I agree that clinically and radiographically he has an ileus. This is much more likely than SBO. There is no indication for surgical intervention. In his current condition, I'm not sure he would survive an operation, in any event.  Recommend continue nasogastric suction, flexiseal tube, , treatment of C. Difficile colitis. Remains ventilator dependent due to aspiration pneumonia. Remains on mechanical ventilation care bundle. Acute renal failure improving. MR Deafness  Angelia Mould. Derrell Lolling, M.D., [facility] General and Minimally invasive Surgery Breast and Colorectal Surgery Office:   [phone] Pager:   [phone]

## 2012-12-09 NOTE — Progress Notes (Signed)
PULMONARY  / CRITICAL CARE MEDICINE  Name: LENARD KAMPF MRN: [id number] DOB: 06/24/1945    ADMISSION DATE:  12/05/2012 CONSULTATION DATE:  12/07/12  REFERRING MD :  Penny Pia, MD PRIMARY SERVICE: PCCM  CHIEF COMPLAINT:  Acute respiratory failure.   BRIEF PATIENT DESCRIPTION:  67 years old male resident of a nursing home with PMH relevant for mental retardation, deafness, HTN. Admid cdiff, aspiration, vent.  SIGNIFICANT EVENTS / STUDIES:  11/22 intubation 11/24- remains vented, weaning  LINES / TUBES: Lef IJ CVC 12/07/12>>> ETT 11/22>>>  CULTURES: Blood MRSA PCR p- neg Urine - neg C Diff PCR - Positive  ANTIBIOTICS: - Zosyn - IV Flagyl - Rectal vancomycin  SUBJECTIVE/OVERNIGHT/INTERVAL HX No output from flexiseal  VITAL SIGNS: Temp:  [98.4 F (36.9 C)-100.4 F (38 C)] 100.4 F (38 C) (11/24 1000) Pulse Rate:  [87-126] 123 (11/24 1000) Resp:  [14-22] 22 (11/24 1000) BP: (68-162)/(47-100) 162/98 mmHg (11/24 1000) SpO2:  [95 %-100 %] 97 % (11/24 1000) FiO2 (%):  [40 %] 40 % (11/24 1028) HEMODYNAMICS: CVP:  [6 mmHg-14 mmHg] 14 mmHg VENTILATOR SETTINGS: Vent Mode:  [-] PRVC FiO2 (%):  [40 %] 40 % Set Rate:  [16 bmp] 16 bmp Vt Set:  [620 mL] 620 mL PEEP:  [5 cmH20] 5 cmH20 Pressure Support:  [5 cmH20] 5 cmH20 Plateau Pressure:  [10 cmH20-18 cmH20] 10 cmH20 INTAKE / OUTPUT: Intake/Output     11/23 0701 - 11/24 0700 11/24 0701 - 11/25 0700   I.V. (mL/kg) 2719.2 (30.7) 352.8 (4)   IV Piggyback 2735 150   Total Intake(mL/kg) 5454.2 (61.5) 502.8 (5.7)   Urine (mL/kg/hr) 890 (0.4) 355 (0.9)   Emesis/NG output 900 (0.4) 100 (0.3)   Stool     Total Output 1790 455   Net +3664.2 +47.8          PHYSICAL EXAMINATION: General: vented Neuro: rass -3 HEENT: ett, jvd wnl PULM: CTA anterior, ronchi resolved CV: s1 s2 rrt GI: soft, distended, hypo BS, no r/g Extremities: edema gen  LABS: PULMONARY  Recent Labs Lab 12/07/12 1800  PHART 7.369   PCO2ART 38.8  PO2ART 197.0*  HCO3 21.8  TCO2 19.3  O2SAT 99.3    CBC  Recent Labs Lab 12/07/12 0335 12/08/12 0630 12/09/12 0530  HGB 13.6 11.9* 12.3*  HCT 39.5 34.4* 36.2*  WBC 16.7* 12.4* 15.3*  PLT 178 193 201    COAGULATION No results found for this basename: INR,  in the last 168 hours  CARDIAC  No results found for this basename: TROPONINI,  in the last 168 hours  Recent Labs Lab 12/07/12 0335  PROBNP 680.8*     CHEMISTRY  Recent Labs Lab 12/05/12 1728 12/06/12 0330 12/07/12 0335 12/08/12 0630 12/09/12 0530  NA 139 138 138 141 143  K 4.2 4.3 4.1 4.1 4.0  CL 104 108 109 110 115*  CO2 22 21 22 22 21   GLUCOSE 101* 114* 131* 115* 95  BUN 45* 39* 25* 20 15  CREATININE 1.98* 1.80* 1.27 1.14 1.15  CALCIUM 7.0* 6.6* 7.1* 7.0* 7.2*  MG  --   --   --   --  1.9  PHOS  --   --   --   --  2.1*   Estimated Creatinine Clearance: 69.4 ml/min (by C-G formula based on Cr of 1.15).   LIVER  Recent Labs Lab 12/05/12 0747 12/05/12 1728 12/06/12 0330 12/08/12 0630  AST 36 36 31 24  ALT 18 18 15  12  ALKPHOS 72 70 65 62  BILITOT 0.9 0.8 0.6 1.1  PROT 5.7* 5.7* 5.3* 4.9*  ALBUMIN 3.0* 2.8* 2.5* 2.0*     INFECTIOUS  Recent Labs Lab 12/05/12 0758 12/05/12 0843 12/07/12 0335 12/07/12 1000  LATICACIDVEN 3.16*  --   --  1.3  PROCALCITON  --  5.28 1.61  --      ENDOCRINE CBG (last 3)  No results found for this basename: GLUCAP,  in the last 72 hours       IMAGING x48h  Dg Abd 1 View  12/08/2012   CLINICAL DATA:  Small bowel obstruction, abdominal distention.  EXAM: ABDOMEN - 1 VIEW  COMPARISON:  December 07, 2012.  FINDINGS: Nasogastric tube is seen in the expected position of the distal stomach or proximal duodenum. There is continued presence of dilated loops of large and small bowel. No significant change is noted. No abnormal calcifications noted.  IMPRESSION: Nasogastric tube is unchanged in position. Stable large and small bowel  dilatation compared to prior exam.   Electronically Signed   By: Roque Lias M.D.   On: 12/08/2012 08:52   Dg Abd 1 View  12/07/2012   CLINICAL DATA:  NG tube placement.  EXAM: ABDOMEN - 1 VIEW  COMPARISON:  12/07/2012  FINDINGS: NG tube crosses the gastroesophageal junction and it extends to the right of midline. Numerous loops of air-filled dilated small and large bowel.  IMPRESSION: NG tube by position expected to be within the distal stomach.   Electronically Signed   By: Esperanza Heir M.D.   On: 12/07/2012 17:51   Dg Chest Port 1 View  12/09/2012   CLINICAL DATA:  Endotracheal tube tip position  EXAM: PORTABLE CHEST - 1 VIEW  COMPARISON:  Multiple recent previous exams.  FINDINGS: 0507 hours. Endotracheal tube tip is 8.2 cm above the base of the carina. The NG tube passes into the stomach although the distal tip position is not included on the film. Left IJ central line tip projects at the level of the innominate vein confluence.  Interval progression of right greater than left airspace disease with bibasilar atelectasis and small, right greater than left, pleural effusions.  Telemetry leads overlie the chest.  IMPRESSION: Slight progression of the diffuse airspace disease in the right lung.  Endotracheal tube tip has pulled back with tip now 8.2 cm above the carina.   Electronically Signed   By: Kennith Center M.D.   On: 12/09/2012 07:19   Dg Chest Port 1 View  12/08/2012   CLINICAL DATA:  Respiratory failure and aspiration pneumonia.  EXAM: PORTABLE CHEST - 1 VIEW  COMPARISON:  12/07/2012  FINDINGS: Endotracheal tube tip is approximately 7 cm above the carina. Central line positioning is stable. Nasogastric tube extends below the diaphragm. Lungs show airspace disease on the right which could be due to aspiration. No pulmonary edema or pleural effusion is identified. The heart size is stable.  IMPRESSION: Right lung airspace disease.   Electronically Signed   By: Irish Lack M.D.   On:  12/08/2012 09:27   Dg Chest Port 1 View  12/07/2012   CLINICAL DATA:  Line placement  EXAM: PORTABLE CHEST - 1 VIEW  COMPARISON:  12/07/2012  FINDINGS: Left internal jugular central line placed with tip over the superior vena cava just below the level of the azygos arch. No pneumothorax. Increased consolidation throughout the right lung.  IMPRESSION: Central line as described. Other support devices stable. Increased right lung consolidation.   Electronically  Signed   By: Esperanza Heir M.D.   On: 12/07/2012 21:08   Dg Chest Port 1 View  12/07/2012   CLINICAL DATA:  Intubation.  EXAM: PORTABLE CHEST - 1 VIEW  COMPARISON:  12/07/2012  FINDINGS: Endotracheal tube is approximately 6 cm above the carina. Bilateral lower lobe airspace opacities are noted, most likely atelectasis although infiltrate/pneumonia cannot be excluded at the right lung base. Heart is normal size. NG tube is seen entering the stomach.  IMPRESSION: Endotracheal tube of approximately 6 cm above the carina.  Bibasilar atelectasis or infiltrates, increasing on the right.   Electronically Signed   By: Charlett Nose M.D.   On: 12/07/2012 17:46   Dg Abd Portable 1v  12/09/2012   CLINICAL DATA:  Ileus.  C difficile colitis.  EXAM: PORTABLE ABDOMEN - 1 VIEW  COMPARISON:  12/08/2012.  FINDINGS: NG tube in stable position. Persistent dilated loops of small and large bowel are noted. These findings are most consistent adynamic ileus. No evidence of free air noted. Degenerative changes lumbar spine. Bibasilar atelectasis and/or infiltrates.  IMPRESSION: 1. Persistent unchanged dilated loops of small and large bowel most consistent with adynamic ileus. NG tube in stable position. 2. Bibasilar atelectasis and/or infiltrates.   Electronically Signed   By: Maisie Fus  Register   On: 12/09/2012 07:13   ASSESSMENT / PLAN:  PULMONARY A: 1) Acute hypoxemic respiratory failure, aspiration PNA P:   - pcxr in am -advance ett 4 cm -maintain current  mV -wean cpap 5 ps 5, goal 2 hrs -hope for improved neurostatus  CARDIOVASCULAR A:  1) Hypotension likely secondary to sedation and volume depletion. 12/08/12: Not on pressors. REsonded to fluids P:  - MAP goal > 65 - CVP . 10-11, consider kvo - contnue d5 half normal with K, but follow crt closely  RENAL A:   1) Acute renal failure improving 12/08/12: renal failur and lacti acidosis resolved  P:   - kvo - Follow UP CMP in am -correct lytes with ileus concerns  GASTROINTESTINAL A:   1) C. Diff colitis 2) SBO vs ileus  12/08/12: No a candidate for surgery per CCS  P:   - Surgery input appreciated - NGT to suction - PPI -OK to start oral vanc  HEMATOLOGIC A:   1) No issues P:  - SQ heparin - Will follow CBC - PRBC for hgb < 7gm%  INFECTIOUS A:   1) Aspiration pneumonits / pneumonia. Spiked a fever.  2) C. Diff colitis P:   - Zosyn, in setting of cdiff, need to limit empiric abx - IV Flagyl - Rectal Vancomycin, add oral vanc  ENDOCRINE A:   1) ssi   NEUROLOGIC A:   1) Mental retardation 2) Deafness 3) Sedation  11/23/134: Above causing problems with SBT P:   - Will optimize sedation to RASS 0 to -1  - Propofol, wua  - Fentanyl   The patient is critically ill with multiple organ systems failure and requires high complexity decision making for assessment and support, frequent evaluation and titration of therapies, application of advanced monitoring technologies and extensive interpretation of multiple databases.   Critical Care Time devoted to patient care services described in this note is  35  Minutes.  Mcarthur Rossetti. Tyson Alias, MD, FACP Pgr: [phone] [facility]

## 2012-12-10 ENCOUNTER — Inpatient Hospital Stay (HOSPITAL_COMMUNITY): Payer: PRIVATE HEALTH INSURANCE

## 2012-12-10 DIAGNOSIS — A419 Sepsis, unspecified organism: Secondary | ICD-10-CM

## 2012-12-10 DIAGNOSIS — R6521 Severe sepsis with septic shock: Secondary | ICD-10-CM

## 2012-12-10 LAB — COMPREHENSIVE METABOLIC PANEL
ALT: 13 U/L (ref 0–53)
Albumin: 2.2 g/dL — ABNORMAL LOW (ref 3.5–5.2)
BUN: 13 mg/dL (ref 6–23)
Creatinine, Ser: 1.03 mg/dL (ref 0.50–1.35)
GFR calc Af Amer: 85 mL/min — ABNORMAL LOW (ref >90)
Potassium: 3.7 mEq/L (ref 3.5–5.1)
Total Protein: 5.4 g/dL — ABNORMAL LOW (ref 6.0–8.3)

## 2012-12-10 LAB — BLOOD GAS, ARTERIAL
Acid-base deficit: 3.6 mmol/L — ABNORMAL HIGH (ref 0.0–2.0)
Bicarbonate: 19.2 mEq/L — ABNORMAL LOW (ref 20.0–24.0)
Drawn by: 11249
FIO2: 0.4 %
MECHVT: 620 mL
O2 Saturation: 98.5 %
RATE: 16 resp/min
pCO2 arterial: 31.4 mmHg — ABNORMAL LOW (ref 35.0–45.0)

## 2012-12-10 LAB — CBC WITH DIFFERENTIAL/PLATELET
Basophils Absolute: 0.2 10*3/uL — ABNORMAL HIGH (ref 0.0–0.1)
Eosinophils Absolute: 0.2 10*3/uL (ref 0.0–0.7)
Eosinophils Relative: 1 % (ref 0–5)
HCT: 35.9 % — ABNORMAL LOW (ref 39.0–52.0)
Lymphocytes Relative: 6 % — ABNORMAL LOW (ref 12–46)
Lymphs Abs: 1 10*3/uL (ref 0.7–4.0)
MCHC: 33.7 g/dL (ref 30.0–36.0)
Neutro Abs: 13.3 10*3/uL — ABNORMAL HIGH (ref 1.7–7.7)
Neutrophils Relative %: 81 % — ABNORMAL HIGH (ref 43–77)
Platelets: 235 10*3/uL (ref 150–400)
RBC: 4.22 MIL/uL (ref 4.22–5.81)
RDW: 14.2 % (ref 11.5–15.5)

## 2012-12-10 LAB — URINALYSIS, ROUTINE W REFLEX MICROSCOPIC
Glucose, UA: NEGATIVE mg/dL
Hgb urine dipstick: NEGATIVE
Ketones, ur: 15 mg/dL — AB
Protein, ur: 30 mg/dL — AB
pH: 5.5 (ref 5.0–8.0)

## 2012-12-10 LAB — URINE MICROSCOPIC-ADD ON

## 2012-12-10 LAB — PHOSPHORUS: Phosphorus: 3.2 mg/dL (ref 2.3–4.6)

## 2012-12-10 LAB — MAGNESIUM: Magnesium: 2 mg/dL (ref 1.5–2.5)

## 2012-12-10 MED ORDER — FENTANYL CITRATE 0.05 MG/ML IJ SOLN
50.0000 ug | INTRAMUSCULAR | Status: DC | PRN
  Administered 2012-12-11 – 2012-12-27 (×10): 50 ug via INTRAVENOUS
  Filled 2012-12-10 (×12): qty 2

## 2012-12-10 MED ORDER — VANCOMYCIN HCL 10 G IV SOLR
1250.0000 mg | Freq: Two times a day (BID) | INTRAVENOUS | Status: DC
  Administered 2012-12-10 – 2012-12-12 (×7): 1250 mg via INTRAVENOUS
  Filled 2012-12-10 (×9): qty 1250

## 2012-12-10 MED ORDER — SORBITOL 70 % SOLN
960.0000 mL | TOPICAL_OIL | Freq: Once | ORAL | Status: AC
  Administered 2012-12-10: 960 mL via RECTAL
  Filled 2012-12-10: qty 240

## 2012-12-10 MED ORDER — FENTANYL CITRATE 0.05 MG/ML IJ SOLN
50.0000 ug | INTRAMUSCULAR | Status: AC | PRN
  Administered 2012-12-10 (×3): 50 ug via INTRAVENOUS
  Filled 2012-12-10 (×2): qty 2

## 2012-12-10 NOTE — Progress Notes (Signed)
50mg  of IV Fentanyl wasted with Oneita Hurt, RN. Wasted in sink.

## 2012-12-10 NOTE — Progress Notes (Addendum)
ANTIBIOTIC CONSULT NOTE - Follow up  Pharmacy Consult for Zosyn, Vanc IV Indication: rule out sepsis, aspiration PNA  No Known Allergies  Patient Measurements: Height: 6' (182.9 cm) Weight: 202 lb 9.6 oz (91.9 kg) IBW/kg (Calculated) : 77.6  Vital Signs: Temp: 100 F (37.8 C) (11/25 1000) Temp src: Core (Comment) (11/25 0600) BP: 157/96 mmHg (11/25 1000) Pulse Rate: 108 (11/25 1000) Intake/Output from previous day: 11/24 0701 - 11/25 0700 In: 992.3 [I.V.:492.3; IV Piggyback:500] Out: 2925 [Urine:1650; Emesis/NG output:1275] Intake/Output from this shift: Total I/O In: 120 [I.V.:20; IV Piggyback:100] Out: 260 [Urine:185; Emesis/NG output:75]  Labs:  Recent Labs  12/08/12 0630 12/09/12 0530 12/10/12 0554  WBC 12.4* 15.3* 16.5*  HGB 11.9* 12.3* 12.1*  PLT 193 201 235  CREATININE 1.14 1.15 1.03   Estimated Creatinine Clearance: 77.4 ml/min (by C-G formula based on Cr of 1.03). No results found for this basename: VANCOTROUGH, VANCOPEAK, VANCORANDOM, GENTTROUGH, GENTPEAK, GENTRANDOM, TOBRATROUGH, TOBRAPEAK, TOBRARND, AMIKACINPEAK, AMIKACINTROU, AMIKACIN,  in the last 72 hours   Microbiology: Recent Results (from the past 720 hour(s))  CULTURE, BLOOD (ROUTINE X 2)     Status: None   Collection Time    12/05/12  8:43 AM      Result Value Range Status   Specimen Description BLOOD RIGHT HAND   Final   Special Requests BOTTLES DRAWN AEROBIC AND ANAEROBIC EA   Final   Culture  Setup Time     Final   Value: 12/05/2012 13:28     Performed at [facility]   Culture     Final   Value:        BLOOD CULTURE RECEIVED NO GROWTH TO DATE CULTURE WILL BE HELD FOR 5 DAYS BEFORE ISSUING A FINAL NEGATIVE REPORT     Performed at [facility]   Report Status PENDING   Incomplete  CULTURE, BLOOD (ROUTINE X 2)     Status: None   Collection Time    12/05/12  8:50 AM      Result Value Range Status   Specimen Description BLOOD LEFT WRIST   Final   Special Requests  BOTTLES DRAWN AEROBIC AND ANAEROBIC 5CC EA   Final   Culture  Setup Time     Final   Value: 12/05/2012 13:28     Performed at [facility]   Culture     Final   Value:        BLOOD CULTURE RECEIVED NO GROWTH TO DATE CULTURE WILL BE HELD FOR 5 DAYS BEFORE ISSUING A FINAL NEGATIVE REPORT     Performed at [facility]   Report Status PENDING   Incomplete  URINE CULTURE     Status: None   Collection Time    12/05/12 10:58 AM      Result Value Range Status   Specimen Description URINE, CATHETERIZED   Final   Special Requests NONE   Final   Culture  Setup Time     Final   Value: 12/05/2012 15:33     Performed at [facility] Count     Final   Value: NO GROWTH     Performed at [facility]   Culture     Final   Value: NO GROWTH     Performed at [facility]   Report Status 12/06/2012 FINAL   Final  CLOSTRIDIUM DIFFICILE BY PCR     Status: Abnormal   Collection Time    12/05/12 12:38  PM      Result Value Range Status   C difficile by pcr POSITIVE (*) NEGATIVE Final   Comment: CRITICAL RESULT CALLED TO, READ BACK BY AND VERIFIED WITH:     AMorrie Sheldon RN 15:15 12/05/12 ([email or web address])     Performed at [facility]  MRSA PCR SCREENING     Status: None   Collection Time    12/05/12  1:26 PM      Result Value Range Status   MRSA by PCR NEGATIVE  NEGATIVE Final   Comment:            The GeneXpert MRSA Assay (FDA     approved for NASAL specimens     only), is one component of a     comprehensive MRSA colonization     surveillance program. It is not     intended to diagnose MRSA     infection nor to guide or     monitor treatment for     MRSA infections.   Medications:  Scheduled:  . antiseptic oral rinse  1 application Mouth Rinse QID  . chlorhexidine  15 mL Mouth/Throat BID  . famotidine (PEPCID) IV  20 mg Intravenous Q24H  . metronidazole  500 mg Intravenous Q8H  . pantoprazole (PROTONIX) IV  40 mg Intravenous Daily  .  piperacillin-tazobactam (ZOSYN)  IV  3.375 g Intravenous Q8H  . sodium chloride  3 mL Intravenous Q12H  . vancomycin  1,250 mg Intravenous Q12H  . vancomycin  500 mg Per Tube Q6H  . vancomycin (VANCOCIN) rectal ENEMA  500 mg Rectal Q6H   Assessment: 67 yo with hx HTN, mental retardation, deafness who presented to ER from group home with 12 hours of vomiting and diarrhea and initial BP of 81/50. Patient also with elevated lactic acid and procalcitonin. Broad spectrum abx's for possible sepsis/aspiration pna. C.diff positive. 11/25 resuming IV Vanc d/t fever and re-culturing. CCM considering BAL.   11/20 >> Zosyn IV >>  11/20 >> Flagyl IV >> 11/20 >> Vanc IV >> 11/20 11/20 >> Vanc PR >> 11/24 >> Vanc VT >> 11/25 >> Vanc IV >>  Tmax: 100.4 WBCs: rising Renal: SCr improved from admission. 77CG, 71N Procalc: 5.28 -> 1.61(11/22)  11/20 blood: ngtd 11/20 urine: neg 11/20 stool: collected 11/20 CDiff PCR: positive 11/20 MRSA PCR: negative  Goal of Therapy:  Eradication of infection Vanc trough 15-76mcg/ml  Plan:   Vancomycin 1250mg  IV q12h.  Cont Zosyn 3.375g IV Q8H infused over 4hrs.  MD dosing abx for CDAD: Flagyl, PO and rectal Vanc. Will ask to stop PPI.  Measure Vanc trough at steady state.  Follow up renal fxn and culture results.  Charolotte Eke, PharmD, pager [phone]. 12/10/2012,10:35 AM.

## 2012-12-10 NOTE — Progress Notes (Signed)
NUTRITION FOLLOW UP  Intervention:   If unable to extubate pt today, recommend initiating trickle feeds of Vital High Protein @ 20 ml/hr (this will provide 480 kcal, 42 grams protein, and 401 ml of H2O.) RD to continue to monitor nutrition care plan and vent status  Nutrition Dx:   Inadequate oral intake related to altered GI function as evidenced by npo status and vent; ongoing  Goal:   Extubation with diet advancement vs. TPN; ongoing  Monitor:   Plan of care; possible wean today Labs; low calcium, low albumin, low phosphorus Weight trend; increased 7 lbs since admission  Assessment:   67 years old male resident of a nursing home with PMH relevant for mental retardation, deafness, HTN. Admid cdiff, aspiration, vent. Per RN pt is being weaned off of propofol; unable to wean pt off vent yesterday but will re-attempt to wean again today. Per RN pt receiving Vanc enemas QID with minimal stool output (smear BM today) and NG tube has put out 1200 ml since admission. Per MD surgery note, bowel sounds are normal, x-ray is consistent with diffuse ileus, no evidence of obstruction.   Height: Ht Readings from Last 1 Encounters:  12/07/12 6' (1.829 m)    Weight Status:   Wt Readings from Last 1 Encounters:  12/10/12 202 lb 9.6 oz (91.9 kg)   Patient is currently intubated on ventilator support.  MV: 10.1 L/min Temp:Temp (24hrs), Avg:100.5 F (38.1 C), Min:100 F (37.8 C), Max:101.3 F (38.5 C)  Propofol: 1.6 ml/hr  Re-estimated needs:  Kcal: 2171 Protein: 110-125 grams Fluid: 2.2-2.5 L/day  Skin: +1 RUE and LUE edema, non-pitting RLE and LLE edema; intact  Diet Order: NPO   Intake/Output Summary (Last 24 hours) at 12/10/12 1258 Last data filed at 12/10/12 1150  Gross per 24 hour  Intake 606.39 ml  Output   2875 ml  Net -2268.61 ml    Last BM: 11/25   Labs:   Recent Labs Lab 12/08/12 0630 12/09/12 0530 12/10/12 0554  NA 141 143 142  K 4.1 4.0 3.7  CL 110  115* 111  CO2 22 21 20   BUN 20 15 13   CREATININE 1.14 1.15 1.03  CALCIUM 7.0* 7.2* 7.5*  MG  --  1.9 2.0  PHOS  --  2.1* 3.2  GLUCOSE 115* 95 99    CBG (last 3)  No results found for this basename: GLUCAP,  in the last 72 hours  Scheduled Meds: . antiseptic oral rinse  1 application Mouth Rinse QID  . chlorhexidine  15 mL Mouth/Throat BID  . famotidine (PEPCID) IV  20 mg Intravenous Q24H  . metronidazole  500 mg Intravenous Q8H  . piperacillin-tazobactam (ZOSYN)  IV  3.375 g Intravenous Q8H  . sodium chloride  3 mL Intravenous Q12H  . sorbitol, milk of mag, mineral oil, glycerin (SMOG) enema  960 mL Rectal Once  . vancomycin  1,250 mg Intravenous Q12H  . vancomycin  500 mg Per Tube Q6H  . vancomycin (VANCOCIN) rectal ENEMA  500 mg Rectal Q6H    Continuous Infusions: . propofol Stopped (12/10/12 1010)    Ian Malkin RD, LDN Inpatient Clinical Dietitian Pager: [phone] After Hours Pager: [phone]

## 2012-12-10 NOTE — Progress Notes (Signed)
General surgery attending: I have examined this patient and reviewed his radiographic films and his clinical course. I agree with the assessment and treatment plan outlined by Ms. Dortch, PA.  His abdomen is soft but little bit distended. Bowel sounds are normal to hyperactive.  X-rays are consistent with diffuse ileus, no evidence of obstruction.  Plan:   I think you could begin clamping the NG tube and possibly even starting tube feedings at 20 cc per hour. I have ordered an enema to help stimulate colon evacuation Would continue vancomycin enemas as ordered There is no identifiable acute surgical problem. Will sign off for now. Discussed with Dr. Tyson Alias.  Please reconsult as necessary.   Angelia Mould. Derrell Lolling, M.D., [facility] General and Minimally invasive Surgery Breast and Colorectal Surgery Office:   [phone] Pager:   [phone]

## 2012-12-10 NOTE — Progress Notes (Signed)
Subjective: Sedated and intubated on ventilator.  Opens eyes minimally.  Does not follow commands.  Does not appear significantly uncomfortable.  Was unable to wean yesterday.  Objective: Vital signs in last 24 hours: Temp:  [99.9 F (37.7 C)-101.3 F (38.5 C)] 100.4 F (38 C) (11/25 0700) Pulse Rate:  [95-123] 102 (11/25 0700) Resp:  [15-22] 17 (11/25 0700) BP: (136-177)/(63-102) 155/88 mmHg (11/25 0700) SpO2:  [96 %-99 %] 96 % (11/25 0700) FiO2 (%):  [40 %] 40 % (11/25 0700) Weight:  [202 lb 9.6 oz (91.9 kg)] 202 lb 9.6 oz (91.9 kg) (11/25 0536) Last BM Date: 12/09/12  Intake/Output from previous day: 11/24 0701 - 11/25 0700 In: 992.3 [I.V.:492.3; IV Piggyback:500] Out: 2925 [Urine:1650; Emesis/NG output:1275] Intake/Output this shift: Total I/O In: 100 [IV Piggyback:100] Out: -   PE: Gen:  Alert, NAD, pleasant Abd: Soft, tympanic and distended, NT, loud BS, no HSM, flexi-seal in place with last output 20mL on 12/07/12  Lab Results:   Recent Labs  12/09/12 0530 12/10/12 0554  WBC 15.3* 16.5*  HGB 12.3* 12.1*  HCT 36.2* 35.9*  PLT 201 235   BMET  Recent Labs  12/09/12 0530 12/10/12 0554  NA 143 142  K 4.0 3.7  CL 115* 111  CO2 21 20  GLUCOSE 95 99  BUN 15 13  CREATININE 1.15 1.03  CALCIUM 7.2* 7.5*   PT/INR No results found for this basename: LABPROT, INR,  in the last 72 hours CMP     Component Value Date/Time   NA 142 12/10/2012 0554   K 3.7 12/10/2012 0554   CL 111 12/10/2012 0554   CO2 20 12/10/2012 0554   GLUCOSE 99 12/10/2012 0554   BUN 13 12/10/2012 0554   CREATININE 1.03 12/10/2012 0554   CALCIUM 7.5* 12/10/2012 0554   PROT 5.4* 12/10/2012 0554   ALBUMIN 2.2* 12/10/2012 0554   AST 25 12/10/2012 0554   ALT 13 12/10/2012 0554   ALKPHOS 66 12/10/2012 0554   BILITOT 0.7 12/10/2012 0554   GFRNONAA 74* 12/10/2012 0554   GFRAA 85* 12/10/2012 0554   Lipase     Component Value Date/Time   LIPASE 48 12/05/2012 0747        Studies/Results: Dg Chest Port 1 View  12/10/2012   CLINICAL DATA:  Evaluate endotracheal tube position.  EXAM: PORTABLE CHEST - 1 VIEW  COMPARISON:  12/09/2012  FINDINGS: Endotracheal tube is 4.3 cm above the carina. Left jugular catheter is near the junction of the innominate vein and SVC. Nasogastric tube extends into the abdomen. There is persistent patchy bilateral parenchymal densities, right side greater than left. There may be increased left pleural fluid. Negative for a pneumothorax. Heart size is grossly stable.  IMPRESSION: Bilateral parenchymal lung disease, right side greater than left. Concern for new or increased left pleural fluid.  Endotracheal tube is appropriately positioned.   Electronically Signed   By: Richarda Overlie M.D.   On: 12/10/2012 07:55   Dg Chest Port 1 View  12/09/2012   CLINICAL DATA:  Recent intubation. History of septic shock and acute renal failure and chronic organic brain syndrome.  EXAM: PORTABLE CHEST - 1 VIEW  COMPARISON:  December 09, 2012 at 5:07 a.m.  FINDINGS: The endotracheal tube tip now lies approximately 1 cm below the inferior margin of the clavicular heads and approximately 4 cm above the carina. The esophagogastric tube tip and proximal port project off the inferior margin of the film. The left internal jugular venous catheter tip lies  in the region of the junction of the right and left brachiocephalic veins. The pulmonary interstitial markings remain increased. The right hemidiaphragm remains obscured. The cardiac silhouette is not enlarged. Coarse left perihilar lung markings are unchanged.  IMPRESSION: 1. The endotracheal tube has been advanced such that its tip now lies 4.2 cm above the carina. 2. The appearance of the pulmonary interstitium has not significantly changed since earlier today.   Electronically Signed   By: David  [place]   On: 12/09/2012 12:49   Dg Chest Port 1 View  12/09/2012   CLINICAL DATA:  Endotracheal tube tip position   EXAM: PORTABLE CHEST - 1 VIEW  COMPARISON:  Multiple recent previous exams.  FINDINGS: 0507 hours. Endotracheal tube tip is 8.2 cm above the base of the carina. The NG tube passes into the stomach although the distal tip position is not included on the film. Left IJ central line tip projects at the level of the innominate vein confluence.  Interval progression of right greater than left airspace disease with bibasilar atelectasis and small, right greater than left, pleural effusions.  Telemetry leads overlie the chest.  IMPRESSION: Slight progression of the diffuse airspace disease in the right lung.  Endotracheal tube tip has pulled back with tip now 8.2 cm above the carina.   Electronically Signed   By: Kennith Center M.D.   On: 12/09/2012 07:19   Dg Abd Portable 1v  12/09/2012   CLINICAL DATA:  Ileus.  C difficile colitis.  EXAM: PORTABLE ABDOMEN - 1 VIEW  COMPARISON:  12/08/2012.  FINDINGS: NG tube in stable position. Persistent dilated loops of small and large bowel are noted. These findings are most consistent adynamic ileus. No evidence of free air noted. Degenerative changes lumbar spine. Bibasilar atelectasis and/or infiltrates.  IMPRESSION: 1. Persistent unchanged dilated loops of small and large bowel most consistent with adynamic ileus. NG tube in stable position. 2. Bibasilar atelectasis and/or infiltrates.   Electronically Signed   By: Maisie Fus  Register   On: 12/09/2012 07:13    Anti-infectives: Anti-infectives   Start     Dose/Rate Route Frequency Ordered Stop   12/09/12 1200  vancomycin (VANCOCIN) 50 mg/mL oral solution 500 mg     500 mg Per Tube 4 times per day 12/09/12 1142     12/06/12 0600  vancomycin (VANCOCIN) 1,250 mg in sodium chloride 0.9 % 250 mL IVPB  Status:  Discontinued     1,250 mg 166.7 mL/hr over 90 Minutes Intravenous Every 24 hours 12/05/12 1006 12/05/12 1628   12/05/12 1800  vancomycin (VANCOCIN) 500 mg in sodium chloride irrigation 0.9 % 100 mL ENEMA     500 mg  Rectal 4 times per day 12/05/12 1424 12/19/12 1759   12/05/12 1600  metroNIDAZOLE (FLAGYL) IVPB 500 mg     500 mg 100 mL/hr over 60 Minutes Intravenous Every 8 hours 12/05/12 1424 12/19/12 1559   12/05/12 1430  vancomycin (VANCOCIN) 50 mg/mL oral solution 500 mg  Status:  Discontinued     500 mg Per Tube 4 times per day 12/05/12 1424 12/05/12 1425   12/05/12 1015  piperacillin-tazobactam (ZOSYN) IVPB 3.375 g     3.375 g 12.5 mL/hr over 240 Minutes Intravenous Every 8 hours 12/05/12 1006     12/05/12 0845  vancomycin (VANCOCIN) IVPB 1000 mg/200 mL premix     1,000 mg 200 mL/hr over 60 Minutes Intravenous STAT 12/05/12 0830 12/05/12 1017   12/05/12 0830  piperacillin-tazobactam (ZOSYN) IVPB 3.375 g  3.375 g 100 mL/hr over 30 Minutes Intravenous STAT 12/05/12 0806 12/05/12 1017   12/05/12 0815  piperacillin-tazobactam (ZOSYN) IVPB 3.375 g  Status:  Discontinued     3.375 g 12.5 mL/hr over 240 Minutes Intravenous  Once 12/05/12 0804 12/05/12 0805   12/05/12 0815  vancomycin (VANCOCIN) IVPB 1000 mg/200 mL premix  Status:  Discontinued     1,000 mg 200 mL/hr over 60 Minutes Intravenous  Once 12/05/12 0813 12/05/12 0813       Assessment/Plan Adynamic ileus C. Diff colitis with diarrhea Septic shock Acute respiratory failure from aspiration PNA Mental retardation Deafness   Plan: 1.  Cont conservative management for now.  IVF, pain control, antiemetics, flexi-seal, NG ouput was 1290mL/24 hours.  May be able to start clamping trials tomorrow if output is improved.   2.  No surgery planned at this time, poor surgical candidate, await return of bowel function 3.  CCM is trying to wean from the ventilator, but was unable to do so yesterday. 4.  Continue vanc PO & enemas 5.  Repeat KUB, cont to monitor    LOS: 5 days    Aris [place] 12/10/2012, 8:35 AM Pager: [phone]

## 2012-12-10 NOTE — Progress Notes (Signed)
PULMONARY  / CRITICAL CARE MEDICINE  Name: Victor Little MRN: [id number] DOB: Jun 02, 1945    ADMISSION DATE:  12/05/2012 CONSULTATION DATE:  12/07/12  REFERRING MD :  Penny Pia, MD PRIMARY SERVICE: PCCM  CHIEF COMPLAINT:  Acute respiratory failure.   BRIEF PATIENT DESCRIPTION:  67 years old male resident of a nursing home with PMH relevant for mental retardation, deafness, HTN. Admid cdiff, aspiration, vent.  SIGNIFICANT EVENTS / STUDIES:  11/22:  Intubation 11/24:  Remains vented, weaning  LINES / TUBES: Lef IJ CVC 12/07/12>>> ETT 11/22>>>  CULTURES: 11/20:  Blood culture >>> NGTD 11/20:  MRSA PCR >>> Negative 11/21:  Urine culture >>> Negative, Final 11/20:  C Diff PCR - Positive  ANTIBIOTICS: Zosyn:    11/20 >>> IV Flagyl:    11/20 >>> Rectal vancomycin:   11/20 >>> Oral vanc   11/24>>>  SUBJECTIVE/OVERNIGHT/INTERVAL HX  Remains febrile overnight.  FlexiSeal now has output per surgery  VITAL SIGNS: Temp:  [100 F (37.8 C)-101.3 F (38.5 C)] 100.4 F (38 C) (11/25 0700) Pulse Rate:  [95-123] 102 (11/25 0700) Resp:  [15-22] 17 (11/25 0700) BP: (136-177)/(67-102) 155/88 mmHg (11/25 0700) SpO2:  [96 %-99 %] 96 % (11/25 0700) FiO2 (%):  [40 %] 40 % (11/25 0700) Weight:  [91.9 kg (202 lb 9.6 oz)] 91.9 kg (202 lb 9.6 oz) (11/25 0536) HEMODYNAMICS: CVP:  [7 mmHg-13 mmHg] 9 mmHg VENTILATOR SETTINGS: Vent Mode:  [-] PRVC FiO2 (%):  [40 %] 40 % Set Rate:  [16 bmp] 16 bmp Vt Set:  [620 mL] 620 mL PEEP:  [5 cmH20] 5 cmH20 Plateau Pressure:  [9 cmH20-19 cmH20] 15 cmH20 INTAKE / OUTPUT: Intake/Output     11/24 0701 - 11/25 0700 11/25 0701 - 11/26 0700   I.V. (mL/kg) 492.3 (5.4) 17.8 (0.2)   IV Piggyback 500 100   Total Intake(mL/kg) 992.3 (10.8) 117.8 (1.3)   Urine (mL/kg/hr) 1650 (0.7) 140 (0.7)   Emesis/NG output 1275 (0.6)    Total Output 2925 140   Net -1932.8 -22.2          PHYSICAL EXAMINATION: General: vented Neuro: rass -3 HEENT: ett, jvd  wnl PULM:coarse rt base CV: s1 s2 rrt GI: soft, distended,increased BS, no r/g Extremities: edema gen  LABS: PULMONARY  Recent Labs Lab 12/07/12 1800 12/10/12 0432  PHART 7.369 7.411  PCO2ART 38.8 31.4*  PO2ART 197.0* 132.0*  HCO3 21.8 19.2*  TCO2 19.3 17.2  O2SAT 99.3 98.5    CBC  Recent Labs Lab 12/08/12 0630 12/09/12 0530 12/10/12 0554  HGB 11.9* 12.3* 12.1*  HCT 34.4* 36.2* 35.9*  WBC 12.4* 15.3* 16.5*  PLT 193 201 235    COAGULATION No results found for this basename: INR,  in the last 168 hours  CARDIAC  No results found for this basename: TROPONINI,  in the last 168 hours  Recent Labs Lab 12/07/12 0335  PROBNP 680.8*     CHEMISTRY  Recent Labs Lab 12/06/12 0330 12/07/12 0335 12/08/12 0630 12/09/12 0530 12/10/12 0554  NA 138 138 141 143 142  K 4.3 4.1 4.1 4.0 3.7  CL 108 109 110 115* 111  CO2 21 22 22 21 20   GLUCOSE 114* 131* 115* 95 99  BUN 39* 25* 20 15 13   CREATININE 1.80* 1.27 1.14 1.15 1.03  CALCIUM 6.6* 7.1* 7.0* 7.2* 7.5*  MG  --   --   --  1.9 2.0  PHOS  --   --   --  2.1* 3.2  Estimated Creatinine Clearance: 77.4 ml/min (by C-G formula based on Cr of 1.03).   LIVER  Recent Labs Lab 12/05/12 0747 12/05/12 1728 12/06/12 0330 12/08/12 0630 12/10/12 0554  AST 36 36 31 24 25   ALT 18 18 15 12 13   ALKPHOS 72 70 65 62 66  BILITOT 0.9 0.8 0.6 1.1 0.7  PROT 5.7* 5.7* 5.3* 4.9* 5.4*  ALBUMIN 3.0* 2.8* 2.5* 2.0* 2.2*     INFECTIOUS  Recent Labs Lab 12/05/12 0758 12/05/12 0843 12/07/12 0335 12/07/12 1000  LATICACIDVEN 3.16*  --   --  1.3  PROCALCITON  --  5.28 1.61  --      ENDOCRINE CBG (last 3)  No results found for this basename: GLUCAP,  in the last 72 hours   IMAGING x48h  Dg Chest Port 1 View  12/10/2012   CLINICAL DATA:  Evaluate endotracheal tube position.  EXAM: PORTABLE CHEST - 1 VIEW  COMPARISON:  12/09/2012  FINDINGS: Endotracheal tube is 4.3 cm above the carina. Left jugular catheter is near  the junction of the innominate vein and SVC. Nasogastric tube extends into the abdomen. There is persistent patchy bilateral parenchymal densities, right side greater than left. There may be increased left pleural fluid. Negative for a pneumothorax. Heart size is grossly stable.  IMPRESSION: Bilateral parenchymal lung disease, right side greater than left. Concern for new or increased left pleural fluid.  Endotracheal tube is appropriately positioned.   Electronically Signed   By: Richarda Overlie M.D.   On: 12/10/2012 07:55   Dg Chest Port 1 View  12/09/2012   CLINICAL DATA:  Recent intubation. History of septic shock and acute renal failure and chronic organic brain syndrome.  EXAM: PORTABLE CHEST - 1 VIEW  COMPARISON:  December 09, 2012 at 5:07 a.m.  FINDINGS: The endotracheal tube tip now lies approximately 1 cm below the inferior margin of the clavicular heads and approximately 4 cm above the carina. The esophagogastric tube tip and proximal port project off the inferior margin of the film. The left internal jugular venous catheter tip lies in the region of the junction of the right and left brachiocephalic veins. The pulmonary interstitial markings remain increased. The right hemidiaphragm remains obscured. The cardiac silhouette is not enlarged. Coarse left perihilar lung markings are unchanged.  IMPRESSION: 1. The endotracheal tube has been advanced such that its tip now lies 4.2 cm above the carina. 2. The appearance of the pulmonary interstitium has not significantly changed since earlier today.   Electronically Signed   By: David  [place]   On: 12/09/2012 12:49   Dg Chest Port 1 View  12/09/2012   CLINICAL DATA:  Endotracheal tube tip position  EXAM: PORTABLE CHEST - 1 VIEW  COMPARISON:  Multiple recent previous exams.  FINDINGS: 0507 hours. Endotracheal tube tip is 8.2 cm above the base of the carina. The NG tube passes into the stomach although the distal tip position is not included on the film. Left  IJ central line tip projects at the level of the innominate vein confluence.  Interval progression of right greater than left airspace disease with bibasilar atelectasis and small, right greater than left, pleural effusions.  Telemetry leads overlie the chest.  IMPRESSION: Slight progression of the diffuse airspace disease in the right lung.  Endotracheal tube tip has pulled back with tip now 8.2 cm above the carina.   Electronically Signed   By: Kennith Center M.D.   On: 12/09/2012 07:19   Dg Abd Portable 1v  12/09/2012   CLINICAL DATA:  Ileus.  C difficile colitis.  EXAM: PORTABLE ABDOMEN - 1 VIEW  COMPARISON:  12/08/2012.  FINDINGS: NG tube in stable position. Persistent dilated loops of small and large bowel are noted. These findings are most consistent adynamic ileus. No evidence of free air noted. Degenerative changes lumbar spine. Bibasilar atelectasis and/or infiltrates.  IMPRESSION: 1. Persistent unchanged dilated loops of small and large bowel most consistent with adynamic ileus. NG tube in stable position. 2. Bibasilar atelectasis and/or infiltrates.   Electronically Signed   By: Maisie Fus  Register   On: 12/09/2012 07:13   ASSESSMENT / PLAN:  PULMONARY A: Acute hypoxemic respiratory failure, aspiration PNA  P:   - CXR repeat in am  - Maintain current mV  - Wean cpap 5 ps 5, goal 2 hrs, need to assess airway protection skills -secretions are excessive, may hinder extubation  CARDIOVASCULAR A:  Hypotension likely secondary to sedation and volume depletion. Not on pressors. REsonded to fluids  P:  - MAP goal > 60, met on own - kvo  RENAL A:   Acute renal failure improving Renal failure and lacti acidosis resolved  P:   - KVO on IVF -bmet in am   GASTROINTESTINAL A:   1) C. Diff colitis 2) SBO vs ileus  P:   - Surgery input appreciated - NGT to suction - PPI - Oral Vancomycin started, tolerated -continue to follow output  HEMATOLOGIC A:   No issues  P:  - SQ  heparin - Will follow CBC - PRBC for hgb < 7gm%  INFECTIOUS A:   Aspiration pneumonits / pneumonia. Spiked a fever.  C. Diff colitis  P:   - Zosyn, in setting of cdiff, need to limit empiric abx, unable with fevers infiltrate - IV Flagyl - Rectal Vancomycin, Oral Vancomycin added -weaning well, may be cdiff is cause fever now aspiration infiltrates? -could consider bronch bal to enable narrow abx and limit course abx?, re eval next 24 hrs -add IV vanc -UA -repeat BC  ENDOCRINE A:   At risk hyperglycemia P: cbg   NEUROLOGIC A:   Mental retardation Deafness Sedation  P:   - Will optimize sedation to RASS 0 to -1 - Propofol, WUA mandatory - Fentanyl   The patient is critically ill with multiple organ systems failure and requires high complexity decision making for assessment and support, frequent evaluation and titration of therapies, application of advanced monitoring technologies and extensive interpretation of multiple databases.   Critical Care Time devoted to patient care services described in this note is  35  Minutes.  Mcarthur Rossetti. Tyson Alias, MD, FACP Pgr: [phone] [place] [facility]

## 2012-12-11 ENCOUNTER — Encounter (HOSPITAL_COMMUNITY): Payer: Self-pay | Admitting: Radiology

## 2012-12-11 ENCOUNTER — Inpatient Hospital Stay (HOSPITAL_COMMUNITY): Payer: PRIVATE HEALTH INSURANCE

## 2012-12-11 LAB — BASIC METABOLIC PANEL
BUN: 17 mg/dL (ref 6–23)
Calcium: 7.5 mg/dL — ABNORMAL LOW (ref 8.4–10.5)
Creatinine, Ser: 0.99 mg/dL (ref 0.50–1.35)
GFR calc Af Amer: >90 mL/min (ref >90)
GFR calc non Af Amer: 83 mL/min — ABNORMAL LOW (ref >90)
Glucose, Bld: 107 mg/dL — ABNORMAL HIGH (ref 70–99)
Potassium: 3.6 mEq/L (ref 3.5–5.1)

## 2012-12-11 LAB — CBC WITH DIFFERENTIAL/PLATELET
Basophils Absolute: 0.1 10*3/uL (ref 0.0–0.1)
Basophils Relative: 1 % (ref 0–1)
Eosinophils Absolute: 0.1 10*3/uL (ref 0.0–0.7)
HCT: 35.2 % — ABNORMAL LOW (ref 39.0–52.0)
Lymphocytes Relative: 8 % — ABNORMAL LOW (ref 12–46)
MCH: 28.8 pg (ref 26.0–34.0)
MCHC: 34.1 g/dL (ref 30.0–36.0)
Monocytes Absolute: 1.4 10*3/uL — ABNORMAL HIGH (ref 0.1–1.0)
Monocytes Relative: 10 % (ref 3–12)
Neutro Abs: 11.2 10*3/uL — ABNORMAL HIGH (ref 1.7–7.7)
Neutrophils Relative %: 80 % — ABNORMAL HIGH (ref 43–77)
Platelets: 258 10*3/uL (ref 150–400)
RDW: 14.3 % (ref 11.5–15.5)
WBC: 13.9 10*3/uL — ABNORMAL HIGH (ref 4.0–10.5)

## 2012-12-11 LAB — MAGNESIUM: Magnesium: 2 mg/dL (ref 1.5–2.5)

## 2012-12-11 LAB — CULTURE, BLOOD (ROUTINE X 2): Culture: NO GROWTH

## 2012-12-11 MED ORDER — IOHEXOL 300 MG/ML  SOLN
100.0000 mL | Freq: Once | INTRAMUSCULAR | Status: AC | PRN
  Administered 2012-12-11: 100 mL via INTRAVENOUS

## 2012-12-11 MED ORDER — VITAL AF 1.2 CAL PO LIQD
1000.0000 mL | ORAL | Status: DC
  Filled 2012-12-11: qty 1000

## 2012-12-11 MED ORDER — SODIUM CHLORIDE 0.9 % IV SOLN
100.0000 mg | INTRAVENOUS | Status: DC
  Administered 2012-12-11 – 2012-12-18 (×8): 100 mg via INTRAVENOUS
  Filled 2012-12-11 (×10): qty 100

## 2012-12-11 MED ORDER — FUROSEMIDE 10 MG/ML IJ SOLN: 40.0000 mg | Freq: Two times a day (BID) | INTRAMUSCULAR | Status: DC

## 2012-12-11 MED ORDER — SODIUM CHLORIDE 0.9 % IV SOLN: 100.0000 mg | Freq: Every day | INTRAVENOUS | Status: DC

## 2012-12-11 MED ORDER — AMLODIPINE BESYLATE 10 MG PO TABS
10.0000 mg | ORAL_TABLET | Freq: Every day | ORAL | Status: DC
  Administered 2012-12-11: 10 mg via ORAL
  Filled 2012-12-11 (×2): qty 1

## 2012-12-11 MED ORDER — IOHEXOL 300 MG/ML  SOLN
25.0000 mL | INTRAMUSCULAR | Status: AC
  Administered 2012-12-11 (×2): 25 mL via ORAL

## 2012-12-11 MED ORDER — FUROSEMIDE 10 MG/ML IJ SOLN
40.0000 mg | Freq: Two times a day (BID) | INTRAMUSCULAR | Status: AC
  Administered 2012-12-11: 40 mg via INTRAVENOUS
  Filled 2012-12-11: qty 4

## 2012-12-11 NOTE — Progress Notes (Addendum)
eLink Physician-Brief Progress Note Patient Name: Victor Little DOB: 05/22/1945 MRN: [id number]  Date of Service  12/11/2012   HPI/Events of Note   Pneumoperitoneum  eICU Interventions  Discussed with General Surgery on call who will review CT     Loral Campi 12/11/2012, 5:58 PM

## 2012-12-11 NOTE — Progress Notes (Signed)
PULMONARY  / CRITICAL CARE MEDICINE  Name: Victor Little MRN: [id number] DOB: [birth year]    ADMISSION DATE:  12/05/2012 CONSULTATION DATE:  12/07/12  REFERRING MD :  Penny Pia, MD PRIMARY SERVICE: PCCM  CHIEF COMPLAINT:  Acute respiratory failure.   BRIEF PATIENT DESCRIPTION:  67 years old male resident of a nursing home with PMH relevant for mental retardation, deafness, HTN. Admid cdiff, aspiration, vent.  SIGNIFICANT EVENTS / STUDIES:  11/22:  Intubation 11/24:  Remains vented, weaning  LINES / TUBES: Lef IJ CVC 12/07/12>>> ETT 11/22 >>> 1126  CULTURES: 11/20:  Blood culture >>> NGTD 11/20:  MRSA PCR >>> Negative 11/21:  Urine culture >>> Negative, Final 11/20:  C Diff PCR - Positive  ANTIBIOTICS: Zosyn:    11/20 >>> IV Flagyl:    11/20 >>> Rectal vancomycin:   11/20 >>> Oral vanc   11/24 >>> Vancomycin IV 11/25 >>>  SUBJECTIVE/OVERNIGHT/INTERVAL HX  Continue to have low grade fever overnight with Vancomycin added yesterday.  FlexiSeal has discontinue.  Multiple BM with enema by surgery yesterday.  Patient required minimal sedation on vent weaning this morning and seem to be more interactive.  At baseline, he is interactive and can communicate with group home personal.  VITAL SIGNS: Temp:  [99.5 F (37.5 C)-101.5 F (38.6 C)] 99.5 F (37.5 C) (11/26 0700) Pulse Rate:  [80-119] 83 (11/26 0700) Resp:  [15-23] 16 (11/26 0700) BP: (135-182)/(68-117) 152/88 mmHg (11/26 0700) SpO2:  [90 %-97 %] 97 % (11/26 0700) FiO2 (%):  [40 %] 40 % (11/26 0850) HEMODYNAMICS: CVP:  [7 mmHg-8 mmHg] 7 mmHg VENTILATOR SETTINGS: Vent Mode:  [-] CPAP FiO2 (%):  [40 %] 40 % Set Rate:  [16 bmp] 16 bmp Vt Set:  [620 mL] 620 mL PEEP:  [5 cmH20] 5 cmH20 Pressure Support:  [5 cmH20] 5 cmH20 Plateau Pressure:  [14 cmH20-17 cmH20] 14 cmH20 INTAKE / OUTPUT: Intake/Output     11/25 0701 - 11/26 0700 11/26 0701 - 11/27 0700   I.V. (mL/kg) 20.4 (0.2)    IV Piggyback 1000    Total  Intake(mL/kg) 1020.4 (11.1)    Urine (mL/kg/hr) 1330 (0.6)    Emesis/NG output 250 (0.1)    Total Output 1580     Net -559.6          Stool Occurrence 6 x      PHYSICAL EXAMINATION: General: Vented, more movement today and seemed to be more awake, not follow commands in light of deafness Neuro: Vented and sedated, RASS at 0 to -1 HEENT: NCAT, eye close mostly, no discharge    ET tube in mouth PULM:  Coarse breath sound bilaterally, no wheeze CV: RRR, s1 and s2 heard, no murmur/rub/gallop GI: Soft, slight distention, (+) BS-less active compare to yesterday Extremities: generalized swelling, no pitting edema, equal pulses, warm to touch  LABS: PULMONARY  Recent Labs Lab 12/07/12 1800 12/10/12 0432  PHART 7.369 7.411  PCO2ART 38.8 31.4*  PO2ART 197.0* 132.0*  HCO3 21.8 19.2*  TCO2 19.3 17.2  O2SAT 99.3 98.5    CBC  Recent Labs Lab 12/09/12 0530 12/10/12 0554 12/11/12 0532  HGB 12.3* 12.1* 12.0*  HCT 36.2* 35.9* 35.2*  WBC 15.3* 16.5* 13.9*  PLT 201 235 258    COAGULATION No results found for this basename: INR,  in the last 168 hours  CARDIAC  No results found for this basename: TROPONINI,  in the last 168 hours  Recent Labs Lab 12/07/12 0335  PROBNP 680.8*  CHEMISTRY  Recent Labs Lab 12/07/12 0335 12/08/12 0630 12/09/12 0530 12/10/12 0554 12/11/12 0532  NA 138 141 143 142 143  K 4.1 4.1 4.0 3.7 3.6  CL 109 110 115* 111 111  CO2 22 22 21 20 20   GLUCOSE 131* 115* 95 99 107*  BUN 25* 20 15 13 17   CREATININE 1.27 1.14 1.15 1.03 0.99  CALCIUM 7.1* 7.0* 7.2* 7.5* 7.5*  MG  --   --  1.9 2.0 2.0  PHOS  --   --  2.1* 3.2 3.1   Estimated Creatinine Clearance: 80.6 ml/min (by C-G formula based on Cr of 0.99).   LIVER  Recent Labs Lab 12/05/12 0747 12/05/12 1728 12/06/12 0330 12/08/12 0630 12/10/12 0554  AST 36 36 31 24 25   ALT 18 18 15 12 13   ALKPHOS 72 70 65 62 66  BILITOT 0.9 0.8 0.6 1.1 0.7  PROT 5.7* 5.7* 5.3* 4.9* 5.4*   ALBUMIN 3.0* 2.8* 2.5* 2.0* 2.2*     INFECTIOUS  Recent Labs Lab 12/05/12 0758 12/05/12 0843 12/07/12 0335 12/07/12 1000  LATICACIDVEN 3.16*  --   --  1.3  PROCALCITON  --  5.28 1.61  --      ENDOCRINE CBG (last 3)  No results found for this basename: GLUCAP,  in the last 72 hours   IMAGING x48h  Dg Chest Port 1 View  12/10/2012   CLINICAL DATA:  Evaluate endotracheal tube position.  EXAM: PORTABLE CHEST - 1 VIEW  COMPARISON:  12/09/2012  FINDINGS: Endotracheal tube is 4.3 cm above the carina. Left jugular catheter is near the junction of the innominate vein and SVC. Nasogastric tube extends into the abdomen. There is persistent patchy bilateral parenchymal densities, right side greater than left. There may be increased left pleural fluid. Negative for a pneumothorax. Heart size is grossly stable.  IMPRESSION: Bilateral parenchymal lung disease, right side greater than left. Concern for new or increased left pleural fluid.  Endotracheal tube is appropriately positioned.   Electronically Signed   By: Richarda Overlie M.D.   On: 12/10/2012 07:55   Dg Chest Port 1 View  12/09/2012   CLINICAL DATA:  Recent intubation. History of septic shock and acute renal failure and chronic organic brain syndrome.  EXAM: PORTABLE CHEST - 1 VIEW  COMPARISON:  December 09, 2012 at 5:07 a.m.  FINDINGS: The endotracheal tube tip now lies approximately 1 cm below the inferior margin of the clavicular heads and approximately 4 cm above the carina. The esophagogastric tube tip and proximal port project off the inferior margin of the film. The left internal jugular venous catheter tip lies in the region of the junction of the right and left brachiocephalic veins. The pulmonary interstitial markings remain increased. The right hemidiaphragm remains obscured. The cardiac silhouette is not enlarged. Coarse left perihilar lung markings are unchanged.  IMPRESSION: 1. The endotracheal tube has been advanced such that its  tip now lies 4.2 cm above the carina. 2. The appearance of the pulmonary interstitium has not significantly changed since earlier today.   Electronically Signed   By: David  [place]   On: 12/09/2012 12:49   Dg Abd Portable 2v  12/10/2012   CLINICAL DATA:  Distended abdomen, suspected ileus versus small bowel obstruction.  EXAM: PORTABLE ABDOMEN - 2 VIEW  COMPARISON:  09 December 2012.  FINDINGS: There is persistent gaseous distention of small and large bowel loops. Air-fluid levels are present on the left side down decubitus films. The volume of gas  has not significantly changed since yesterday's study. There is no significant gas in the rectum. The nasogastric tube tip lies in the region of the distal stomach or proximal duodenum. There are stable radiodensities in the right mid abdomen. The observed bony structures appear normal.  IMPRESSION: The findings are consistent with a generalized ileus. Both small and large bowel are involved. Overall the volume of gas present may have increased slightly since yesterday's study.   Electronically Signed   By: David  [place]   On: 12/10/2012 10:14   ASSESSMENT / PLAN:  PULMONARY A: Acute hypoxemic respiratory failure Aspiration PNA  P:   - Tolerate weaning well for past 2 morning - Extubation consideration if becomes more awake - WBC improving - Continue current abx regimen  CARDIOVASCULAR A:  Hypertension  P:  - Was hypotensive but now borderline HTNsive, add home norvasc - KVO IFV  RENAL A:   Acute Renal Failure - Resolved Lactic Acidosis - Resolved overload  P:   - KVO on IVF - BMET for monitoring -lasix addition  GASTROINTESTINAL A:   C. Diff colitis - Continue to have diarrhea like stool SBO vs ileus - Resolving  P:   - PPI change to H2 antogonist in C. Diff setting - Tube feeding if not able to extubate today, vital - Oral Vancomycin started, tolerated - Rectal output is occuring  HEMATOLOGIC A:   Leukocytosis -  Improving  P:  - Secondary to C. Diff infection, improving - SQ heparin - Will follow CBC, PRBC for hgb < 7  INFECTIOUS A:   Aspiration pneumonitis/PNA C. Diff Colitis  P:   - IV Flagyl, Oral Vancomycin, and Rectal Vancomycin for C. Diff, dc enema vanc - Vancomycin and Zosyn for PNA, Vancomycin added yesterday due to fever spike - Fever resolved and WBC trending down with improved respiratory failure - Continue to have loose bowel movement - Continue abx regimen  ENDOCRINE A:   At risk hyperglycemia  P: - CBG   NEUROLOGIC A:   Mental retardation Deafness Sedation R/o CVA  P:   - Will optimize sedation to RASS 0 to -1 - Propofol, WUA mandatory - Fentanyl -ct head   The patient is critically ill with multiple organ systems failure and requires high complexity decision making for assessment and support, frequent evaluation and titration of therapies, application of advanced monitoring technologies and extensive interpretation of multiple databases.   Critical Care Time devoted to patient care services described in this note is  35  Minutes.  Mcarthur Rossetti. Tyson Alias, MD, FACP Pgr: [phone] [place] [facility]

## 2012-12-11 NOTE — Progress Notes (Signed)
NUTRITION FOLLOW UP/TF CONSULT  Intervention:   - Start TF of Vital 1.2 AF at 75ml/hr increase by 10ml every 4 hours to goal of 24ml/hr. Goal rate will provide 2016 calories, 126g protein, free water and meet 97% estimated calorie needs, 100% estimated protein needs. If IVF d/c, recommend water flushes 4 times/day - Continue adult enteral protocol - Will continue to monitor   Nutrition Dx:   Inadequate oral intake related to altered GI function as evidenced by npo status and vent; ongoing  Goal:   Extubation with diet advancement vs. TPN - neither met, plans to start TF  New goal: TF tolerance and to meet >90% of estimated nutritional needs  Monitor:   Weights, labs, TF advancement/tolerance, vent status   Assessment:   67 years old male resident of a nursing home with PMH relevant for mental retardation, deafness, HTN. Admid cdiff, aspiration, vent.  11/25 - Per RN pt is being weaned off of propofol; unable to wean pt off vent yesterday but will re-attempt to wean again today. Per RN pt receiving Vanc enemas QID with minimal stool output (smear BM today) and NG tube has put out 1200 ml since admission. Per MD surgery note, bowel sounds are normal, x-ray is consistent with diffuse ileus, no evidence of obstruction.   11/26 - Extubated considered if pt becomes more awake. Found to have C. Difficile with diarrhea like stool. RD consulted for TF initiation/management. Weight up 20 pounds since admit.   Patient is currently intubated on ventilator support.  MV: 9 L/min Temp:Temp (24hrs), Avg:100.1 F (37.8 C), Min:99.5 F (37.5 C), Max:101.5 F (38.6 C)  Propofol: off  Height: Ht Readings from Last 1 Encounters:  12/07/12 6' (1.829 m)    Weight Status:   Wt Readings from Last 1 Encounters:  12/10/12 202 lb 9.6 oz (91.9 kg)  Admit wt:        182 lb 5 oz (82.6 kg)   Net I/Os: +5.8L   Re-estimated needs:  Kcal: 2070 Protein: 110-125 grams Fluid: 2.2-2.5  L/day  Skin: +1 RUE, LUE, LLE, RLE edema  Diet Order: NPO   Intake/Output Summary (Last 24 hours) at 12/11/12 1100 Last data filed at 12/11/12 0600  Gross per 24 hour  Intake    850 ml  Output   1320 ml  Net   -470 ml    Last BM: 11/25   Labs:   Recent Labs Lab 12/09/12 0530 12/10/12 0554 12/11/12 0532  NA 143 142 143  K 4.0 3.7 3.6  CL 115* 111 111  CO2 21 20 20   BUN 15 13 17   CREATININE 1.15 1.03 0.99  CALCIUM 7.2* 7.5* 7.5*  MG 1.9 2.0 2.0  PHOS 2.1* 3.2 3.1  GLUCOSE 95 99 107*    CBG (last 3)  No results found for this basename: GLUCAP,  in the last 72 hours  Scheduled Meds: . amLODipine  10 mg Oral Daily  . antiseptic oral rinse  1 application Mouth Rinse QID  . chlorhexidine  15 mL Mouth/Throat BID  . famotidine (PEPCID) IV  20 mg Intravenous Q24H  . furosemide  40 mg Intravenous Q12H  . metronidazole  500 mg Intravenous Q8H  . piperacillin-tazobactam (ZOSYN)  IV  3.375 g Intravenous Q8H  . sodium chloride  3 mL Intravenous Q12H  . vancomycin  1,250 mg Intravenous Q12H  . vancomycin  500 mg Per Tube Q6H    Continuous Infusions: . feeding supplement (VITAL AF 1.2 CAL)    .  propofol Stopped (12/10/12 1010)    Levon Hedger MS, RD, LDN [phone] Pager [phone] After Hours Pager

## 2012-12-11 NOTE — Plan of Care (Signed)
I independently evaluated Mr. Victor Little. I agree with Dr. Derrell Lolling. He seems to be making progression towards weaning off the vent.  His lab work is all trending in the right direction. His wbc is decreasing.  He has good urine output. His physical exam is completely benign, although his mental status is somewhat depressed.  Basically, he is showing no signs of sepsis. The CT does show free air but no contrast extravasation.most likely a perforation that has sealed in the past. I do not see any signs that would make me want to subject him to surgery at the moment. We will follow him closely. If he begins to deteriorate, he would need re-evaluation.

## 2012-12-11 NOTE — Progress Notes (Addendum)
I have examined this patient again this afternoon. He is perfectly stable and has been weaning from the ventilator very nicely during the day. He is hemodynamically stable and has a low-grade temp, consistent with his aspiration pneumonia. He does not appear toxic. His abdomen is soft and not tender although it is distended. He has active bowel sounds.  His CT scan shows a large amount of free air mostly in the upper anterior abdomen. The contrast goes through the stomach, duodenum, and small bowel. There is still evidence of sigmoid colitis. There is no other evidence of inflammatory condition. There is no evidence of contrast extravasation.NG tube is down into the second portion of the duodenum.  Assessment/Plan: 1) New large-volume pneumoperitoneum. Clinically this appears benign, possibly related to barotrauma and the ventilator, but I am unsure. Clinically he does not appear septic or to have evidence of of peritonitis clinically or radiographically. We will follow him closely for signs of deterioration. Hopefully this is a benign pneumoperitoneum. Would like to avoid a nontherapeutic laparotomy. Pull NG tube back 6 inches, discussed with nursing.  2)  C. Difficile colitis 3)  Superimposed ileus 4)  VDRF secondary to aspiration pneumonia.    Angelia Mould. Derrell Lolling, M.D., [facility] General and Minimally invasive Surgery Breast and Colorectal Surgery Office:   [phone] Pager:   [phone]

## 2012-12-11 NOTE — Progress Notes (Signed)
Subjective: Pt is not responsive, off sedation, he will close his eyes or react to mouth care, but nothing else.  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 CT of chest show intraperitoneals  free air  Objective: Vital signs in last 24 hours: Temp:  [99.5 F (37.5 C)-101.5 F (38.6 C)] 99.5 F (37.5 C) (11/26 0700) Pulse Rate:  [80-115] 83 (11/26 0700) Resp:  [15-23] 16 (11/26 0700) BP: (135-182)/(68-117) 152/88 mmHg (11/26 0700) SpO2:  [90 %-97 %] 97 % (11/26 0700) FiO2 (%):  [40 %] 40 % (11/26 1232) Last BM Date: 12/10/12 1275 from the NG yesterday 6 stools recorded yesterday, and 1 this AM recorded. 101.3 yesterday,  Low grade fever now. Labs today show improving WBC but still up. BMP is OK.  K+ 3.6 CT of the chest today without contrast show intraperitoneal free air, RML and RLL opacity, right>left pleural effusions CT head show no acute changes. Intake/Output from previous day: 11/25 0701 - 11/26 0700 In: 1020.4 [I.V.:20.4; IV Piggyback:1000] Out: 1580 [Urine:1330; Emesis/NG output:250] Intake/Output this shift: Total I/O In: 400 [IV Piggyback:400] Out: 400 [Urine:400]  General appearance: unresponsive except as noted above.  On ventilator without sedation. GI: he is somewhat distended, + bowel sounds, he does not seem to react with palpation of abdomen.  Lab Results:   Recent Labs  12/10/12 0554 12/11/12 0532  WBC 16.5* 13.9*  HGB 12.1* 12.0*  HCT 35.9* 35.2*  PLT 235 258    BMET  Recent Labs  12/10/12 0554 12/11/12 0532  NA 142 143  K 3.7 3.6  CL 111 111  CO2 20 20  GLUCOSE 99 107*  BUN 13 17  CREATININE 1.03 0.99  CALCIUM 7.5* 7.5*   PT/INR No results found for this basename: LABPROT, INR,  in the last 72 hours   Recent Labs Lab 12/05/12 0747 12/05/12 1728 12/06/12 0330 12/08/12 0630 12/10/12 0554  AST 36 36 31 24 25   ALT 18 18 15 12 13   ALKPHOS 72 70 65 62 66  BILITOT 0.9  0.8 0.6 1.1 0.7  PROT 5.7* 5.7* 5.3* 4.9* 5.4*  ALBUMIN 3.0* 2.8* 2.5* 2.0* 2.2*     Lipase     Component Value Date/Time   LIPASE 48 12/05/2012 0747     Studies/Results: Ct Head Wo Contrast  12/11/2012   CLINICAL DATA:  Mental status changes  EXAM: CT HEAD WITHOUT CONTRAST  TECHNIQUE: Contiguous axial images were obtained from the base of the skull through the vertex without intravenous contrast.  COMPARISON:  Brain MRI 01/15/2008  FINDINGS: There is no evidence of acute hemorrhage. Chronic changes are again appreciated within the left cerebral hemisphere as well as within the posterior fossa unchanged when correlated with prior MRI. No new intra-axial nor extra-axial fluid collection identified. Ventricles cisterns are patent. There is a component of atrophy within the right cerebral hemisphere, mild. Mild areas of low attenuation project within the subcortical, deep, and periventricular white matter regions. The visualized paranasal sinuses and mastoid air cells are patent. There is no evidence of a depressed skull fracture.  IMPRESSION: Chronic an involutional changes without evidence of acute abnormalities.   Electronically Signed   By: Salome Holmes M.D.   On: 12/11/2012 11:56   Ct Chest Wo Contrast  12/11/2012   CLINICAL DATA:  Infiltrates on chest x-ray  EXAM: CT CHEST WITHOUT CONTRAST  TECHNIQUE: Multidetector CT imaging of the chest was performed following the  standard protocol without IV contrast.  COMPARISON:  Chest x-ray from 12/10/2012.  FINDINGS: Endotracheal tube tip is 5.9 cm above the base of the carina. The NG tube tip has not been included on the film but is at least in the very distal stomach and possibly transpyloric. Left IJ central line tip is in the left innominate vein.  There is no axillary lymphadenopathy. No mediastinal lymphadenopathy. No bulky left hilar lymphadenopathy. Airspace disease in the right lung tracks centrally into the right hilum which could obscure  hilar lymphadenopathy. There is a small to moderate right pleural effusion with a small left pleural effusion.  Lung windows demonstrate central airspace disease in the right upper lobe with central alveolar opacity in the right middle and lower lobes as well also with a central predominance. The left lung is relatively clear was only some patchy minimal central airspace disease the left lower lobe. There is dependent collapse/ consolidation in both lower lobes, adjacent to the fluid, right greater than left.  Images which include the upper abdomen show intraperitoneal free air  Bone windows reveal no worrisome lytic or sclerotic osseous lesions.  IMPRESSION: 1. Intraperitoneal free air. 2. Central airspace disease in the right upper middle and lower lobes with relative sparing of the left lung. Imaging features could be compatible with aspiration and/or asymmetric infection. 3. Bilateral pleural effusions, small to moderate in character, right greater than left.  Critical Value/emergent results were called by telephone at the time of interpretation on 12/11/2012 at 12:45 PM to Dr.DANIEL [facility] , who verbally acknowledged these results.   Electronically Signed   By: Kennith Center M.D.   On: 12/11/2012 12:46   Dg Chest Port 1 View  12/10/2012   CLINICAL DATA:  Evaluate endotracheal tube position.  EXAM: PORTABLE CHEST - 1 VIEW  COMPARISON:  12/09/2012  FINDINGS: Endotracheal tube is 4.3 cm above the carina. Left jugular catheter is near the junction of the innominate vein and SVC. Nasogastric tube extends into the abdomen. There is persistent patchy bilateral parenchymal densities, right side greater than left. There may be increased left pleural fluid. Negative for a pneumothorax. Heart size is grossly stable.  IMPRESSION: Bilateral parenchymal lung disease, right side greater than left. Concern for new or increased left pleural fluid.  Endotracheal tube is appropriately positioned.   Electronically Signed    By: Richarda Overlie M.D.   On: 12/10/2012 07:55   Dg Abd Portable 2v  12/10/2012   CLINICAL DATA:  Distended abdomen, suspected ileus versus small bowel obstruction.  EXAM: PORTABLE ABDOMEN - 2 VIEW  COMPARISON:  09 December 2012.  FINDINGS: There is persistent gaseous distention of small and large bowel loops. Air-fluid levels are present on the left side down decubitus films. The volume of gas has not significantly changed since yesterday's study. There is no significant gas in the rectum. The nasogastric tube tip lies in the region of the distal stomach or proximal duodenum. There are stable radiodensities in the right mid abdomen. The observed bony structures appear normal.  IMPRESSION: The findings are consistent with a generalized ileus. Both small and large bowel are involved. Overall the volume of gas present may have increased slightly since yesterday's study.   Electronically Signed   By: David  [place]   On: 12/10/2012 10:14    Medications: . amLODipine  10 mg Oral Daily  . antiseptic oral rinse  1 application Mouth Rinse QID  . chlorhexidine  15 mL Mouth/Throat BID  . famotidine (PEPCID) IV  20 mg Intravenous Q24H  . metronidazole  500 mg Intravenous Q8H  . piperacillin-tazobactam (ZOSYN)  IV  3.375 g Intravenous Q8H  . sodium chloride  3 mL Intravenous Q12H  . vancomycin  1,250 mg Intravenous Q12H  . vancomycin  500 mg Per Tube Q6H    Assessment/Plan Diarrhea with C diff colitis  ileus vs SBO Acute respiratory failure from aspiration PNA  Mental retardation  Deafness   Plan:  He is going now for abdominal pelvis CT we will follow with you.  Dr. Derrell Lolling is aware.       LOS: 6 days    Rosabella Edgin 12/11/2012

## 2012-12-12 DIAGNOSIS — D72829 Elevated white blood cell count, unspecified: Secondary | ICD-10-CM

## 2012-12-12 LAB — BASIC METABOLIC PANEL
BUN: 19 mg/dL (ref 6–23)
CO2: 21 mEq/L (ref 19–32)
Chloride: 108 mEq/L (ref 96–112)
Creatinine, Ser: 0.99 mg/dL (ref 0.50–1.35)
GFR calc Af Amer: >90 mL/min (ref >90)
GFR calc non Af Amer: 83 mL/min — ABNORMAL LOW (ref >90)
Potassium: 3.1 mEq/L — ABNORMAL LOW (ref 3.5–5.1)

## 2012-12-12 LAB — CBC WITH DIFFERENTIAL/PLATELET
Basophils Absolute: 0.2 10*3/uL — ABNORMAL HIGH (ref 0.0–0.1)
HCT: 36.3 % — ABNORMAL LOW (ref 39.0–52.0)
Lymphocytes Relative: 9 % — ABNORMAL LOW (ref 12–46)
MCH: 28.3 pg (ref 26.0–34.0)
MCHC: 33.6 g/dL (ref 30.0–36.0)
Monocytes Absolute: 1.2 10*3/uL — ABNORMAL HIGH (ref 0.1–1.0)
Monocytes Relative: 8 % (ref 3–12)
Neutro Abs: 12.2 10*3/uL — ABNORMAL HIGH (ref 1.7–7.7)
Neutrophils Relative %: 81 % — ABNORMAL HIGH (ref 43–77)
RDW: 14 % (ref 11.5–15.5)
WBC: 15.2 10*3/uL — ABNORMAL HIGH (ref 4.0–10.5)

## 2012-12-12 LAB — GLUCOSE, CAPILLARY
Glucose-Capillary: 92 mg/dL (ref 70–99)
Glucose-Capillary: 95 mg/dL (ref 70–99)

## 2012-12-12 LAB — MAGNESIUM: Magnesium: 1.8 mg/dL (ref 1.5–2.5)

## 2012-12-12 LAB — PHOSPHORUS: Phosphorus: 3 mg/dL (ref 2.3–4.6)

## 2012-12-12 LAB — VANCOMYCIN, TROUGH: Vancomycin Tr: 24.6 ug/mL — ABNORMAL HIGH (ref 10.0–20.0)

## 2012-12-12 MED ORDER — INSULIN ASPART 100 UNIT/ML ~~LOC~~ SOLN
0.0000 [IU] | SUBCUTANEOUS | Status: DC
  Administered 2012-12-13 – 2012-12-17 (×14): 1 [IU] via SUBCUTANEOUS

## 2012-12-12 MED ORDER — CHLORHEXIDINE GLUCONATE 0.12 % MT SOLN
15.0000 mL | Freq: Two times a day (BID) | OROMUCOSAL | Status: DC
  Administered 2012-12-12 – 2012-12-27 (×26): 15 mL via OROMUCOSAL
  Filled 2012-12-12 (×32): qty 15

## 2012-12-12 MED ORDER — TRACE MINERALS CR-CU-F-FE-I-MN-MO-SE-ZN IV SOLN
INTRAVENOUS | Status: AC
  Administered 2012-12-12: 18:00:00 via INTRAVENOUS
  Filled 2012-12-12: qty 1000

## 2012-12-12 MED ORDER — MAGNESIUM SULFATE 50 % IJ SOLN
2.0000 g | Freq: Once | INTRAVENOUS | Status: DC
  Filled 2012-12-12: qty 4

## 2012-12-12 MED ORDER — POTASSIUM CHLORIDE 20 MEQ/15ML (10%) PO LIQD
30.0000 meq | ORAL | Status: AC
  Administered 2012-12-12 (×2): 30 meq
  Filled 2012-12-12 (×2): qty 22.5

## 2012-12-12 MED ORDER — POTASSIUM CHLORIDE 10 MEQ/50ML IV SOLN
10.0000 meq | INTRAVENOUS | Status: AC
  Administered 2012-12-12 (×4): 10 meq via INTRAVENOUS
  Filled 2012-12-12 (×4): qty 50

## 2012-12-12 MED ORDER — VITAMINS A & D EX OINT
TOPICAL_OINTMENT | CUTANEOUS | Status: AC
  Filled 2012-12-12: qty 5

## 2012-12-12 MED ORDER — METOPROLOL TARTRATE 1 MG/ML IV SOLN: 5.0000 mg | INTRAVENOUS | Status: DC | PRN

## 2012-12-12 MED ORDER — FAT EMULSION 20 % IV EMUL
250.0000 mL | INTRAVENOUS | Status: AC
  Administered 2012-12-12: 250 mL via INTRAVENOUS
  Filled 2012-12-12: qty 250

## 2012-12-12 MED ORDER — MAGNESIUM SULFATE 40 MG/ML IJ SOLN
2.0000 g | Freq: Once | INTRAMUSCULAR | Status: AC
  Administered 2012-12-12: 2 g via INTRAVENOUS
  Filled 2012-12-12: qty 50

## 2012-12-12 NOTE — Progress Notes (Signed)
Pt basically ripped BIPAP mask off and stated that he didn't want it on.  RT placed Pt back on 50% VM.  RT will try BIPAp later on.  RT to monitor and assess as needed.

## 2012-12-12 NOTE — Progress Notes (Signed)
[facility] ADULT ICU REPLACEMENT PROTOCOL FOR AM LAB REPLACEMENT ONLY  The patient does apply for the [facility] Adult ICU Electrolyte Replacment Protocol based on the criteria listed below:   1. Is GFR >/= 40 ml/min? yes  Patient's GFR today is >90 2. Is urine output >/= 0.5 ml/kg/hr for the last 6 hours? yes Patient's UOP is .8 ml/kg/hr 3. Is BUN < 60 mg/dL? yes  Patient's BUN today is 19 4. Abnormal electrolyte(s):K 3.1 5. Ordered repletion with: per protocol 6. If a panic level lab has been reported, has the CCM MD in charge been notified? yes.   Physician:  Rollene Rotunda 12/12/2012 4:56 AM

## 2012-12-12 NOTE — Progress Notes (Signed)
General Surgery [facility]  Patient examined again at this time.  Discussed with Dr. Derrell Lolling by telephone this AM.  I am concerned about the volume of free air present on CT scan yesterday afternoon.  Clinical exam shows lower abdominal tenderness.  However, patient is not toxic and VS are stable.  WBC 15K.  Discussed with Dr. Biagio Quint who is on call with me.  Will plan to observe for 24 hours and then re-evaluate for possible laparotomy.  If patient deteriorates in the interim, will require laparotomy.  Continue present management as ordered.  Will follow closely with you.  Velora Heckler, MD, [facility] Office: [phone]

## 2012-12-12 NOTE — Progress Notes (Signed)
Patient ID: Victor Little, male   DOB: [birth year], 67 y.o.   MRN: [id number]  General Surgery - [facility] - Progress Note  Subjective: Patient in ICU on vent.  Discussed with nurse and ICU resident at bedside.  Patient appears to be resting comfortably.  Decreased diarrheal stools per nurse.  Objective: Vital signs in last 24 hours: Temp:  [99.7 F (37.6 C)-100.9 F (38.3 C)] 99.9 F (37.7 C) (11/27 0800) Pulse Rate:  [80-108] 98 (11/27 0800) Resp:  [13-26] 17 (11/27 0800) BP: (125-166)/(66-102) 148/82 mmHg (11/27 0800) SpO2:  [94 %-98 %] 95 % (11/27 0800) FiO2 (%):  [40 %] 40 % (11/27 0800) Weight:  [191 lb 9.3 oz (86.9 kg)] 191 lb 9.3 oz (86.9 kg) (11/27 0100) Last BM Date: 12/11/12  Intake/Output from previous day: 11/26 0701 - 11/27 0700 In: 1880 [I.V.:450; NG/GT:30; IV Piggyback:1400] Out: 1027 [OZDGU:4403; Stool:3]  Exam: HEENT - clear, not icteric Neck - soft Chest - coarse bilaterally Cor - RRR, rate in 90's Abd - soft, mild distension; no BS; tender to palpation lower abdomen with grimacing Ext - mild pedal edema  Lab Results:   Recent Labs  12/11/12 0532 12/12/12 0400  WBC 13.9* 15.2*  HGB 12.0* 12.2*  HCT 35.2* 36.3*  PLT 258 327     Recent Labs  12/11/12 0532 12/12/12 0400  NA 143 141  K 3.6 3.1*  CL 111 108  CO2 20 21  GLUCOSE 107* 99  BUN 17 19  CREATININE 0.99 0.99  CALCIUM 7.5* 7.4*    Studies/Results: Ct Head Wo Contrast  12/11/2012   CLINICAL DATA:  Mental status changes  EXAM: CT HEAD WITHOUT CONTRAST  TECHNIQUE: Contiguous axial images were obtained from the base of the skull through the vertex without intravenous contrast.  COMPARISON:  Brain MRI 01/15/2008  FINDINGS: There is no evidence of acute hemorrhage. Chronic changes are again appreciated within the left cerebral hemisphere as well as within the posterior fossa unchanged when correlated with prior MRI. No new intra-axial nor extra-axial fluid collection  identified. Ventricles cisterns are patent. There is a component of atrophy within the right cerebral hemisphere, mild. Mild areas of low attenuation project within the subcortical, deep, and periventricular white matter regions. The visualized paranasal sinuses and mastoid air cells are patent. There is no evidence of a depressed skull fracture.  IMPRESSION: Chronic an involutional changes without evidence of acute abnormalities.   Electronically Signed   By: Salome Holmes M.D.   On: 12/11/2012 11:56   Ct Chest Wo Contrast  12/11/2012   CLINICAL DATA:  Infiltrates on chest x-ray  EXAM: CT CHEST WITHOUT CONTRAST  TECHNIQUE: Multidetector CT imaging of the chest was performed following the standard protocol without IV contrast.  COMPARISON:  Chest x-ray from 12/10/2012.  FINDINGS: Endotracheal tube tip is 5.9 cm above the base of the carina. The NG tube tip has not been included on the film but is at least in the very distal stomach and possibly transpyloric. Left IJ central line tip is in the left innominate vein.  There is no axillary lymphadenopathy. No mediastinal lymphadenopathy. No bulky left hilar lymphadenopathy. Airspace disease in the right lung tracks centrally into the right hilum which could obscure hilar lymphadenopathy. There is a small to moderate right pleural effusion with a small left pleural effusion.  Lung windows demonstrate central airspace disease in the right upper lobe with central alveolar opacity in the right middle and lower lobes as well also  with a central predominance. The left lung is relatively clear was only some patchy minimal central airspace disease the left lower lobe. There is dependent collapse/ consolidation in both lower lobes, adjacent to the fluid, right greater than left.  Images which include the upper abdomen show intraperitoneal free air  Bone windows reveal no worrisome lytic or sclerotic osseous lesions.  IMPRESSION: 1. Intraperitoneal free air. 2. Central  airspace disease in the right upper middle and lower lobes with relative sparing of the left lung. Imaging features could be compatible with aspiration and/or asymmetric infection. 3. Bilateral pleural effusions, small to moderate in character, right greater than left.  Critical Value/emergent results were called by telephone at the time of interpretation on 12/11/2012 at 12:45 PM to Dr.DANIEL [facility] , who verbally acknowledged these results.   Electronically Signed   By: Kennith Center M.D.   On: 12/11/2012 12:46   Ct Abdomen Pelvis W Contrast  12/11/2012   CLINICAL DATA:  Assess perforation  EXAM: CT ABDOMEN AND PELVIS WITH CONTRAST  TECHNIQUE: Multidetector CT imaging of the abdomen and pelvis was performed using the standard protocol following bolus administration of intravenous contrast.  CONTRAST:  OMNIPAQUE IOHEXOL 300 MG/ML  SOLN  COMPARISON:  12/05/2012  FINDINGS: Small bilateral pleural effusions are identified. There is atelectasis versus consolidative infiltrates within the lung bases. An ill-defined area of airspace disease projects in the base of the right middle lobe.  A moderate to large amount of pneumoperitoneum is identified along the nondependent portion of the abdomen. Punctate foci of air project within the gallbladder fossa. An NG tube is seen with tip projecting region of the distal stomach. Loculated focus of air projects adjacent to the splenic flexure of the colon. Image number 27 series 2.  A 1.9 cm irregularly bordered low attenuating nodule is appreciated within the left lobe of the liver image number 17 series 2. Mild intrahepatic biliary ductal dilatation appreciated. The spleen, adrenals, pancreas, kidneys are unremarkable.  Multiple dilated loops of small bowel are appreciated throughout the mid abdomen and pelvis containing air, nondilute and dilute contrast. Air is appreciated within the nondilated loops of colon as well as fluid within loops of large bowel. The areas  of wall thickening involving the sigmoid colon extending into the rectal sigmoid and anal regions. There is inflammation within the surrounding mesenteric fat. . Within the pelvis adjacent to the distal sigmoid colon a small ill-defined low attenuating nodule is appreciated measuring 2 x 2.2 cm in AP by transverse dimensions image 86 series 2.  There is no evidence of drainable loculated fluid collections nor significant free fluid within the abdomen or pelvis.  There is no evidence of abdominal aortic aneurysm. The celiac, SMA, IMA, portal vein are opacified.  IMPRESSION: 1. Moderate to large amount of pneumoperitoneum, consistent with ruptured hollow viscus until proven otherwise prickly in the absence of known iatrogenic intervention. . A focal loculated region of extraluminal air projects adjacent to the splenic flexure. The bowel in this area is not appear inflamed. This area cannot be excluded as the source. 2. Findings consistent with an ileus versus a partial small bowel obstruction 3. Low attenuating nodule left lobe of the liver concerning for a small liver mass 4. Soft tissue nodule in the pelvis adjacent to the sigmoid colon is may represent a lymph node the weight 9 nodal soft tissue nodules of diagnostic consideration 5. Findings again consistent with colitis involving the sigmoid colon with extension into the rectal anal region  6. Small bilateral effusions as well as atelectasis versus infiltrate within the lung bases and airspace disease, pneumonitis, in the right middle lobe. 7. Dr. Jenne Campus the ICU attending was informed of these findings at the time of the interpretation on 12/11/2012 at 5:19 p.m.   Electronically Signed   By: Salome Holmes M.D.   On: 12/11/2012 17:21   Dg Abd Portable 2v  12/10/2012   CLINICAL DATA:  Distended abdomen, suspected ileus versus small bowel obstruction.  EXAM: PORTABLE ABDOMEN - 2 VIEW  COMPARISON:  09 December 2012.  FINDINGS: There is persistent gaseous  distention of small and large bowel loops. Air-fluid levels are present on the left side down decubitus films. The volume of gas has not significantly changed since yesterday's study. There is no significant gas in the rectum. The nasogastric tube tip lies in the region of the distal stomach or proximal duodenum. There are stable radiodensities in the right mid abdomen. The observed bony structures appear normal.  IMPRESSION: The findings are consistent with a generalized ileus. Both small and large bowel are involved. Overall the volume of gas present may have increased slightly since yesterday's study.   Electronically Signed   By: David  [place]   On: 12/10/2012 10:14    Assessment / Plan: 1.  Cdiff colitis with pneumoperitoneum  Reviewed opinions of Dr. Derrell Lolling and Dr. Maisie Fus from past 24 hours  Reviewed CT scans from yesterday morning and evening - appears that free air volume has increased  WBC slight up at 15.2  Vital signs remain stable  Patient with abdominal tenderness to my exam this AM  Will discuss with Dr. Derrell Lolling - concerned patient is showing early signs of clinical deterioration and may require laparotomy.  Options are to proceed to surgery today versus repeat abdominal CT scan to evaluate quantity of free air versus continued treatment with bowel rest and broad antibiotic coverage.  On Zosyn, Vanco (po and IV), Flagyl, and micafungin currently  Velora Heckler, MD, [facility] Office: [phone]  12/12/2012

## 2012-12-12 NOTE — Progress Notes (Signed)
PULMONARY  / CRITICAL CARE MEDICINE  Name: Victor Little MRN: [id number] DOB: 08/29/1945    ADMISSION DATE:  12/05/2012 CONSULTATION DATE:  12/07/12  REFERRING MD :  Penny Pia, MD PRIMARY SERVICE: PCCM  CHIEF COMPLAINT:  Acute respiratory failure.   BRIEF PATIENT DESCRIPTION:  67 years old male resident of a nursing home with PMH relevant for mental retardation, deafness, HTN. Admid cdiff, aspiration, vent.  SIGNIFICANT EVENTS / STUDIES:  11/22:  Intubation 11/24:  Remains vented, weaning 11/26   ct head>>> no acute 11/26  Ct chest aspiration infil rt, small effusions, free air 11/26:  CT abdomen with free air in abdomen, no pressors  LINES / TUBES: Lef IJ CVC 12/07/12>>> ETT 11/22 >>> 1126  CULTURES: 11/20:  Blood culture >>> NGTD 11/20:  MRSA PCR >>> Negative 11/21:  Urine culture >>> Negative, Final 11/20:  C Diff PCR - Positive  ANTIBIOTICS: Zosyn:    11/20 >>> IV Flagyl:    11/20 >>> Rectal vancomycin:   11/20 >>> 11/26 Oral vanc   11/24 >>> Vancomycin IV 11/25 >>> 11/26 myco>>>  SUBJECTIVE/OVERNIGHT/INTERVAL HX  Continue to have low grade fever.  No interval changes in mentation and behavior.  Continue to have minimal interaction when exam.  Resist eye opening when trying to retract eyelids.  Minimal movement and activity.  Tube feed was not initiated due to concern of pneumoperitoneum on CT scan yesterday.  VITAL SIGNS: Temp:  [99.7 F (37.6 C)-100.9 F (38.3 C)] 99.9 F (37.7 C) (11/27 0800) Pulse Rate:  [80-108] 98 (11/27 0800) Resp:  [13-26] 17 (11/27 0800) BP: (125-166)/(66-102) 148/82 mmHg (11/27 0800) SpO2:  [94 %-98 %] 95 % (11/27 0800) FiO2 (%):  [40 %] 40 % (11/27 0800) Weight:  [86.9 kg (191 lb 9.3 oz)] 86.9 kg (191 lb 9.3 oz) (11/27 0100) HEMODYNAMICS:   VENTILATOR SETTINGS: Vent Mode:  [-] PRVC FiO2 (%):  [40 %] 40 % Set Rate:  [16 bmp] 16 bmp Vt Set:  [620 mL] 620 mL PEEP:  [5 cmH20] 5 cmH20 Pressure Support:  [5 cmH20] 5  cmH20 Plateau Pressure:  [13 cmH20-16 cmH20] 15 cmH20 INTAKE / OUTPUT: Intake/Output     11/26 0701 - 11/27 0700 11/27 0701 - 11/28 0700   I.V. (mL/kg) 450 (5.2)    NG/GT 30    IV Piggyback 1400    Total Intake(mL/kg) 1880 (21.6)    Urine (mL/kg/hr) 4845 (2.3)    Emesis/NG output     Stool 3 (0)    Total Output 4848     Net -2968          Stool Occurrence 3 x      PHYSICAL EXAMINATION: General: Vented, not follow commands in light of deafness Neuro: Vented and sedated, RASS at 0 to -1, continue to resist eyelids retraction HEENT: NCAT, eye close mostly, no discharge    ET tube in mouth PULM:  Coarse breath sound bilaterally, no wheeze CV: RRR, s1 and s2 heard, no murmur/rub/gallop GI: Soft, slight distention, (+) BS Extremities: generalized swelling, no pitting edema, equal pulses, warm to touch  LABS: PULMONARY  Recent Labs Lab 12/07/12 1800 12/10/12 0432  PHART 7.369 7.411  PCO2ART 38.8 31.4*  PO2ART 197.0* 132.0*  HCO3 21.8 19.2*  TCO2 19.3 17.2  O2SAT 99.3 98.5    CBC  Recent Labs Lab 12/10/12 0554 12/11/12 0532 12/12/12 0400  HGB 12.1* 12.0* 12.2*  HCT 35.9* 35.2* 36.3*  WBC 16.5* 13.9* 15.2*  PLT 235 258 327  COAGULATION No results found for this basename: INR,  in the last 168 hours  CARDIAC  No results found for this basename: TROPONINI,  in the last 168 hours  Recent Labs Lab 12/07/12 0335  PROBNP 680.8*     CHEMISTRY  Recent Labs Lab 12/08/12 0630 12/09/12 0530 12/10/12 0554 12/11/12 0532 12/12/12 0400  NA 141 143 142 143 141  K 4.1 4.0 3.7 3.6 3.1*  CL 110 115* 111 111 108  CO2 22 21 20 20 21   GLUCOSE 115* 95 99 107* 99  BUN 20 15 13 17 19   CREATININE 1.14 1.15 1.03 0.99 0.99  CALCIUM 7.0* 7.2* 7.5* 7.5* 7.4*  MG  --  1.9 2.0 2.0 1.8  PHOS  --  2.1* 3.2 3.1 3.0   Estimated Creatinine Clearance: 80.6 ml/min (by C-G formula based on Cr of 0.99).   LIVER  Recent Labs Lab 12/05/12 1728 12/06/12 0330  12/08/12 0630 12/10/12 0554  AST 36 31 24 25   ALT 18 15 12 13   ALKPHOS 70 65 62 66  BILITOT 0.8 0.6 1.1 0.7  PROT 5.7* 5.3* 4.9* 5.4*  ALBUMIN 2.8* 2.5* 2.0* 2.2*     INFECTIOUS  Recent Labs Lab 12/05/12 0843 12/07/12 0335 12/07/12 1000  LATICACIDVEN  --   --  1.3  PROCALCITON 5.28 1.61  --      ENDOCRINE CBG (last 3)  No results found for this basename: GLUCAP,  in the last 72 hours   IMAGING x48h  Ct Head Wo Contrast  12/11/2012   CLINICAL DATA:  Mental status changes  EXAM: CT HEAD WITHOUT CONTRAST  TECHNIQUE: Contiguous axial images were obtained from the base of the skull through the vertex without intravenous contrast.  COMPARISON:  Brain MRI 01/15/2008  FINDINGS: There is no evidence of acute hemorrhage. Chronic changes are again appreciated within the left cerebral hemisphere as well as within the posterior fossa unchanged when correlated with prior MRI. No new intra-axial nor extra-axial fluid collection identified. Ventricles cisterns are patent. There is a component of atrophy within the right cerebral hemisphere, mild. Mild areas of low attenuation project within the subcortical, deep, and periventricular white matter regions. The visualized paranasal sinuses and mastoid air cells are patent. There is no evidence of a depressed skull fracture.  IMPRESSION: Chronic an involutional changes without evidence of acute abnormalities.   Electronically Signed   By: Salome Holmes M.D.   On: 12/11/2012 11:56   Ct Chest Wo Contrast  12/11/2012   CLINICAL DATA:  Infiltrates on chest x-ray  EXAM: CT CHEST WITHOUT CONTRAST  TECHNIQUE: Multidetector CT imaging of the chest was performed following the standard protocol without IV contrast.  COMPARISON:  Chest x-ray from 12/10/2012.  FINDINGS: Endotracheal tube tip is 5.9 cm above the base of the carina. The NG tube tip has not been included on the film but is at least in the very distal stomach and possibly transpyloric. Left IJ  central line tip is in the left innominate vein.  There is no axillary lymphadenopathy. No mediastinal lymphadenopathy. No bulky left hilar lymphadenopathy. Airspace disease in the right lung tracks centrally into the right hilum which could obscure hilar lymphadenopathy. There is a small to moderate right pleural effusion with a small left pleural effusion.  Lung windows demonstrate central airspace disease in the right upper lobe with central alveolar opacity in the right middle and lower lobes as well also with a central predominance. The left lung is relatively clear was only some  patchy minimal central airspace disease the left lower lobe. There is dependent collapse/ consolidation in both lower lobes, adjacent to the fluid, right greater than left.  Images which include the upper abdomen show intraperitoneal free air  Bone windows reveal no worrisome lytic or sclerotic osseous lesions.  IMPRESSION: 1. Intraperitoneal free air. 2. Central airspace disease in the right upper middle and lower lobes with relative sparing of the left lung. Imaging features could be compatible with aspiration and/or asymmetric infection. 3. Bilateral pleural effusions, small to moderate in character, right greater than left.  Critical Value/emergent results were called by telephone at the time of interpretation on 12/11/2012 at 12:45 PM to Dr.Elliot Simoneaux [facility] , who verbally acknowledged these results.   Electronically Signed   By: Kennith Center M.D.   On: 12/11/2012 12:46   Ct Abdomen Pelvis W Contrast  12/11/2012   CLINICAL DATA:  Assess perforation  EXAM: CT ABDOMEN AND PELVIS WITH CONTRAST  TECHNIQUE: Multidetector CT imaging of the abdomen and pelvis was performed using the standard protocol following bolus administration of intravenous contrast.  CONTRAST:  OMNIPAQUE IOHEXOL 300 MG/ML  SOLN  COMPARISON:  12/05/2012  FINDINGS: Small bilateral pleural effusions are identified. There is atelectasis versus consolidative  infiltrates within the lung bases. An ill-defined area of airspace disease projects in the base of the right middle lobe.  A moderate to large amount of pneumoperitoneum is identified along the nondependent portion of the abdomen. Punctate foci of air project within the gallbladder fossa. An NG tube is seen with tip projecting region of the distal stomach. Loculated focus of air projects adjacent to the splenic flexure of the colon. Image number 27 series 2.  A 1.9 cm irregularly bordered low attenuating nodule is appreciated within the left lobe of the liver image number 17 series 2. Mild intrahepatic biliary ductal dilatation appreciated. The spleen, adrenals, pancreas, kidneys are unremarkable.  Multiple dilated loops of small bowel are appreciated throughout the mid abdomen and pelvis containing air, nondilute and dilute contrast. Air is appreciated within the nondilated loops of colon as well as fluid within loops of large bowel. The areas of wall thickening involving the sigmoid colon extending into the rectal sigmoid and anal regions. There is inflammation within the surrounding mesenteric fat. . Within the pelvis adjacent to the distal sigmoid colon a small ill-defined low attenuating nodule is appreciated measuring 2 x 2.2 cm in AP by transverse dimensions image 86 series 2.  There is no evidence of drainable loculated fluid collections nor significant free fluid within the abdomen or pelvis.  There is no evidence of abdominal aortic aneurysm. The celiac, SMA, IMA, portal vein are opacified.  IMPRESSION: 1. Moderate to large amount of pneumoperitoneum, consistent with ruptured hollow viscus until proven otherwise prickly in the absence of known iatrogenic intervention. . A focal loculated region of extraluminal air projects adjacent to the splenic flexure. The bowel in this area is not appear inflamed. This area cannot be excluded as the source. 2. Findings consistent with an ileus versus a partial small  bowel obstruction 3. Low attenuating nodule left lobe of the liver concerning for a small liver mass 4. Soft tissue nodule in the pelvis adjacent to the sigmoid colon is may represent a lymph node the weight 9 nodal soft tissue nodules of diagnostic consideration 5. Findings again consistent with colitis involving the sigmoid colon with extension into the rectal anal region 6. Small bilateral effusions as well as atelectasis versus infiltrate within the lung  bases and airspace disease, pneumonitis, in the right middle lobe. 7. Dr. Jenne Campus the ICU attending was informed of these findings at the time of the interpretation on 12/11/2012 at 5:19 p.m.   Electronically Signed   By: Salome Holmes M.D.   On: 12/11/2012 17:21   Dg Abd Portable 2v  12/10/2012   CLINICAL DATA:  Distended abdomen, suspected ileus versus small bowel obstruction.  EXAM: PORTABLE ABDOMEN - 2 VIEW  COMPARISON:  09 December 2012.  FINDINGS: There is persistent gaseous distention of small and large bowel loops. Air-fluid levels are present on the left side down decubitus films. The volume of gas has not significantly changed since yesterday's study. There is no significant gas in the rectum. The nasogastric tube tip lies in the region of the distal stomach or proximal duodenum. There are stable radiodensities in the right mid abdomen. The observed bony structures appear normal.  IMPRESSION: The findings are consistent with a generalized ileus. Both small and large bowel are involved. Overall the volume of gas present may have increased slightly since yesterday's study.   Electronically Signed   By: David  [place]   On: 12/10/2012 10:14   ASSESSMENT / PLAN:  PULMONARY A: Acute hypoxemic respiratory failure Aspiration PNA  P:   - Will keep vent incase surgical intervention is needed and unresolved encephalopathy -wean cpap5 ps 5, goal 2 hrs then can do ps 10 for additional 4 hrs if able -will repeat pcxr in am  - Continue  Zosyn  CARDIOVASCULAR A:  Hypertension - Adequate control  P:  - KVO IFV - Norvasc restarted, may need to hold all orals -add prn IV metoprolol  RENAL A:   Acute Renal Failure - Resolved Lactic Acidosis - Resolved Volume Overload Hypokalemia  P:   - Lasix started, dc further - Potassium low and was replaced -replace mag  GASTROINTESTINAL A:   C. Diff colitis SBO vs ileus - Resolving Pneumoperitoneum on CT 11/26  --> Surgery reconsulted, have evaluated patient  P:   - PPI change to H2 antogonist in C. Diff setting - Tube feeding held due to CT findings - Rectal Vancomycin discontinued - Continue Flagyl and Oral Vancomycin, however may need to dc all orals - Discussed with surgery  --> He was impressed with increase peritoneal sign, WBC, and more air  --> Will discussed with Dr. Derrell Lolling regarding utility of surgical intervention  --> Appreciated input and intervention in this difficult case -I am concerned about his nutritional status, may consider TPN and new changes, no other options now  HEMATOLOGIC A:   Leukocytosis - Worsened today  P:  - Secondary to C. Diff infection and now questionable intraabdominal - Will follow CBC, PRBC for hgb < 7  INFECTIOUS A:   Aspiration pneumonitis/PNA C. Diff Colitis Pneumoperitoneum - ?Peritonitis>  P:   - IV Flagyl, Oral Vancomycin for C. Diff - Vancomycin and Zosyn for PNA, Vancomycin - Fever resolved and WBC is fluctuating -maintain empiric antifungal  ENDOCRINE A:   At risk hyperglycemia  P: - CBG  NEUROLOGIC A:   Encephalopathy - GI driven process abdo likely  --> Mental retardation  --> Deafness  --> Sedation  --> Normal CT Head  P:   - Will optimize sedation to RASS 0 to -1 - Propofol, WUA mandatory - Fentanyl - CT head negative for CNS progress   The patient is critically ill with multiple organ systems failure and requires high complexity decision making for assessment and support, frequent  evaluation and titration of therapies, application of advanced monitoring technologies and extensive interpretation of multiple databases.   Critical Care Time devoted to patient care services described in this note is  35  Minutes.  Mcarthur Rossetti. Tyson Alias, MD, FACP Pgr: [phone] [facility]

## 2012-12-12 NOTE — Progress Notes (Signed)
PARENTERAL NUTRITION CONSULT NOTE - INITIAL  Pharmacy Consult for TNA Indication: NPO  No Known Allergies  Patient Measurements: Height: 6' (182.9 cm) Weight: 191 lb 9.3 oz (86.9 kg) IBW/kg (Calculated) : 77.6 Adjusted Body Weight: 81 kg Weight PTA: 92 kg  Vital Signs: Temp: 99.9 F (37.7 C) (11/27 1000) Temp src: Core (Comment) (11/27 0800) BP: 139/70 mmHg (11/27 1000) Pulse Rate: 76 (11/27 1000) Intake/Output from previous day: 11/26 0701 - 11/27 0700 In: 1880 [I.V.:450; NG/GT:30; IV Piggyback:1400] Out: 7829 [FAOZH:0865; Stool:3] Intake/Output from this shift: Total I/O In: 292.5 [I.V.:80; IV Piggyback:212.5] Out: 185 [Urine:185]  Labs:  Recent Labs  12/10/12 0554 12/11/12 0532 12/12/12 0400  WBC 16.5* 13.9* 15.2*  HGB 12.1* 12.0* 12.2*  HCT 35.9* 35.2* 36.3*  PLT 235 258 327     Recent Labs  12/10/12 0554 12/11/12 0532 12/12/12 0400  NA 142 143 141  K 3.7 3.6 3.1*  CL 111 111 108  CO2 20 20 21   GLUCOSE 99 107* 99  BUN 13 17 19   CREATININE 1.03 0.99 0.99  CALCIUM 7.5* 7.5* 7.4*  MG 2.0 2.0 1.8  PHOS 3.2 3.1 3.0  PROT 5.4*  --   --   ALBUMIN 2.2*  --   --   AST 25  --   --   ALT 13  --   --   ALKPHOS 66  --   --   BILITOT 0.7  --   --    Estimated Creatinine Clearance: 80.6 ml/min (by C-G formula based on Cr of 0.99).    Recent Labs  12/12/12 0830  GLUCAP 92   Medical History: Past Medical History  Diagnosis Date  . Mental retardation   . Organic brain syndrome   . Hypertension   . Deaf    Medications:  Scheduled:  . antiseptic oral rinse  1 application Mouth Rinse QID  . chlorhexidine  15 mL Mouth/Throat BID  . famotidine (PEPCID) IV  20 mg Intravenous Q24H  . magnesium sulfate 1 - 4 g bolus IVPB  2 g Intravenous Once  . metronidazole  500 mg Intravenous Q8H  . micafungin (MYCAMINE) IV  100 mg Intravenous Q24H  . piperacillin-tazobactam (ZOSYN)  IV  3.375 g Intravenous Q8H  . potassium chloride  10 mEq Intravenous Q1 Hr x 4   . sodium chloride  3 mL Intravenous Q12H  . vancomycin  1,250 mg Intravenous Q12H  . vancomycin  500 mg Per Tube Q6H   Infusions:  . propofol Stopped (12/10/12 1010)   Insulin Requirements in the past 24 hours:  None, no hx of DM Will add sensitive scale q 4 hr with initiation of TNA  Current Nutrition:  None, NPO since admit 11/20  Assessment: 67 yo with hx HTN, mental retardation, deafness who presented to ER from group home 11/20 s/p 12 hours of vomiting and diarrhea and initial BP of 81/50. Complaint of RLQ pain, found positive for CDiff. Pneumoperitoneum on CT, probable ruptured viscus, ileus vs partial SBO, colitis. Plan per CCS: observe x 24 hr, re-evaluate for need for laparotomy, unless clinical picture worsens. Tube feed not begun with probable GI perf, begin TNA via TL central line placed 11/22.  Lytes: K low at 3.1, for KCl 10 mEq/50 ml x 4, Magnesium bolus 2gm  LFT's  Triglyerides  Pre-albumin  Nutritional Goals/ 24 hr: per RD Kcal: 2070  Protein: 110-125 grams  Fluid: 2.2-2.5 L/day  -Aim for goal rate Clinimix E 5/15 of 90 ml/hr, 20%  lipids daily at 48ml/hr to deliver 108 gm Protein, 2013 kCal per 24 hr  Plan:   At 1800 today: begin Clinimix E 5/15 at 40 ml/hr  Lipids 20% at 10 ml/hr daily  TNA labs Mon, Thursday  Follow up labs tomorrow  MVI & trace elements daily  Add Novolog scale sensitive q4 hr  Otho Bellows PharmD Pager [phone] 12/12/2012, 12:18 PM

## 2012-12-12 NOTE — Progress Notes (Signed)
CARE MANAGEMENT NOTE 12/12/2012  Patient:  Victor Little, Victor Little   Account Number:  [id number]  Date Initiated:  12/05/2012  Documentation initiated by:  Greyson Riccardi  Subjective/Objective Assessment:   pt with mental challenges and now hypotensive, positive c-diff,     Action/Plan:   tbd/from [facility]   Anticipated DC Date:  12/15/2012   Anticipated DC Plan:  GROUP HOME  In-house referral  Clinical Social Worker      DC Planning Services  NA      [facility] Choice  NA   Choice offered to / List presented to:  NA   DME arranged  NA      DME agency  NA     HH arranged  NA      HH agency  NA   Status of service:  In process, will continue to follow Medicare Important Message given?  NA - LOS <3 / Initial given by admissions (If response is "NO", the following Medicare IM given date fields will be blank) Date Medicare IM given:   Date Additional Medicare IM given:    Discharge Disposition:    Per UR Regulation:  Reviewed for med. necessity/level of care/duration of stay  If discussed at Long Length of Stay Meetings, dates discussed:   12/10/2012    Comments:  [id number] Earlene Plater RN,BSN,CCM: Case management [phone] Chart reviewed and updated.  Next chart review due on [id number].  VENT DAY 5-weaning started.  possible new need for surgical intervention due to air in the abd cavity. Needs for discharge at time of review:  none   12/09/2012 Colleen Can BSN RN CCM [phone] Conc review completed. Pt is intubated and on ventilator. Discharge plans are for return to grp home. CSW following.   [id number] Earlene Plater, RN,BSN,CCM: Case management [phone] Chart reviewed and updated.  Next chart review due on [id number]. Needs for discharge at time of review:  None

## 2012-12-13 ENCOUNTER — Inpatient Hospital Stay (HOSPITAL_COMMUNITY): Payer: PRIVATE HEALTH INSURANCE

## 2012-12-13 LAB — COMPREHENSIVE METABOLIC PANEL
ALT: 11 U/L (ref 0–53)
BUN: 20 mg/dL (ref 6–23)
CO2: 23 mEq/L (ref 19–32)
Calcium: 7.4 mg/dL — ABNORMAL LOW (ref 8.4–10.5)
Chloride: 111 mEq/L (ref 96–112)
Creatinine, Ser: 1 mg/dL (ref 0.50–1.35)
GFR calc Af Amer: 89 mL/min — ABNORMAL LOW (ref >90)
GFR calc non Af Amer: 76 mL/min — ABNORMAL LOW (ref >90)
Glucose, Bld: 134 mg/dL — ABNORMAL HIGH (ref 70–99)
Sodium: 142 mEq/L (ref 135–145)
Total Bilirubin: 0.5 mg/dL (ref 0.3–1.2)

## 2012-12-13 LAB — CBC
HCT: 33.1 % — ABNORMAL LOW (ref 39.0–52.0)
MCHC: 34.1 g/dL (ref 30.0–36.0)
MCV: 84.2 fL (ref 78.0–100.0)
Platelets: 281 10*3/uL (ref 150–400)
RDW: 14.2 % (ref 11.5–15.5)
WBC: 15.4 10*3/uL — ABNORMAL HIGH (ref 4.0–10.5)

## 2012-12-13 LAB — GLUCOSE, CAPILLARY
Glucose-Capillary: 110 mg/dL — ABNORMAL HIGH (ref 70–99)
Glucose-Capillary: 118 mg/dL — ABNORMAL HIGH (ref 70–99)
Glucose-Capillary: 124 mg/dL — ABNORMAL HIGH (ref 70–99)
Glucose-Capillary: 126 mg/dL — ABNORMAL HIGH (ref 70–99)
Glucose-Capillary: 136 mg/dL — ABNORMAL HIGH (ref 70–99)

## 2012-12-13 LAB — TRIGLYCERIDES: Triglycerides: 102 mg/dL (ref <150)

## 2012-12-13 LAB — DIFFERENTIAL
Basophils Absolute: 0 10*3/uL (ref 0.0–0.1)
Eosinophils Absolute: 0.2 10*3/uL (ref 0.0–0.7)
Lymphs Abs: 1.2 10*3/uL (ref 0.7–4.0)
Monocytes Absolute: 1.4 10*3/uL — ABNORMAL HIGH (ref 0.1–1.0)
Monocytes Relative: 9 % (ref 3–12)
Neutro Abs: 12.6 10*3/uL — ABNORMAL HIGH (ref 1.7–7.7)

## 2012-12-13 MED ORDER — FAT EMULSION 20 % IV EMUL
250.0000 mL | INTRAVENOUS | Status: AC
  Administered 2012-12-13: 250 mL via INTRAVENOUS
  Filled 2012-12-13: qty 250

## 2012-12-13 MED ORDER — TRACE MINERALS CR-CU-F-FE-I-MN-MO-SE-ZN IV SOLN
INTRAVENOUS | Status: AC
  Administered 2012-12-13: 17:00:00 via INTRAVENOUS
  Filled 2012-12-13: qty 2000

## 2012-12-13 MED ORDER — FUROSEMIDE 10 MG/ML IJ SOLN
40.0000 mg | Freq: Two times a day (BID) | INTRAMUSCULAR | Status: AC
  Administered 2012-12-13 – 2012-12-14 (×4): 40 mg via INTRAVENOUS
  Filled 2012-12-13 (×5): qty 4

## 2012-12-13 MED ORDER — POTASSIUM CHLORIDE 10 MEQ/100ML IV SOLN
10.0000 meq | INTRAVENOUS | Status: AC
  Administered 2012-12-13 (×4): 10 meq via INTRAVENOUS
  Filled 2012-12-13 (×4): qty 100

## 2012-12-13 MED ORDER — VANCOMYCIN HCL IN DEXTROSE 1-5 GM/200ML-% IV SOLN
1000.0000 mg | Freq: Two times a day (BID) | INTRAVENOUS | Status: DC
  Administered 2012-12-13 – 2012-12-16 (×6): 1000 mg via INTRAVENOUS
  Filled 2012-12-13 (×7): qty 200

## 2012-12-13 MED ORDER — POTASSIUM CHLORIDE 20 MEQ/15ML (10%) PO LIQD
20.0000 meq | ORAL | Status: AC
  Administered 2012-12-13 (×2): 20 meq
  Filled 2012-12-13 (×3): qty 15

## 2012-12-13 NOTE — Progress Notes (Signed)
CSW following as patient is from: [facility], patient is currently still on the vent & receiving TPN. Will follow-up once patient becomes more medically stable. CSW has completed FL2 & will continue to follow and assist with return.    Unice Bailey, LCSW [facility] Clinical Social Worker cell #: [phone]

## 2012-12-13 NOTE — Progress Notes (Signed)
ANTIBIOTIC CONSULT NOTE - FOLLOW UP  Pharmacy Consult for Vancomycin  Indication: pneumonia  No Known Allergies  Patient Measurements: Height: 6' (182.9 cm) Weight: 192 lb 14.4 oz (87.5 kg) IBW/kg (Calculated) : 77.6 Adjusted Body Weight:   Vital Signs: Temp: 99.5 F (37.5 C) (11/28 0431) Temp src: Core (Comment) (11/28 0431) BP: 159/81 mmHg (11/28 0431) Pulse Rate: 82 (11/28 0431) Intake/Output from previous day: 11/27 0701 - 11/28 0700 In: 2550.5 [I.V.:700; NG/GT:90; IV Piggyback:1312.5; TPN:448] Out: 2130 [Urine:1130; Emesis/NG output:1000] Intake/Output from this shift: Total I/O In: 1245 [I.V.:280; NG/GT:90; IV Piggyback:475; TPN:400] Out: 695 [Urine:395; Emesis/NG output:300]  Labs:  Recent Labs  12/10/12 0554 12/11/12 0532 12/12/12 0400  WBC 16.5* 13.9* 15.2*  HGB 12.1* 12.0* 12.2*  PLT 235 258 327  CREATININE 1.03 0.99 0.99   Estimated Creatinine Clearance: 80.6 ml/min (by C-G formula based on Cr of 0.99).  Recent Labs  12/12/12 2300  VANCOTROUGH 24.6*     Microbiology: Recent Results (from the past 720 hour(s))  CULTURE, BLOOD (ROUTINE X 2)     Status: None   Collection Time    12/05/12  8:43 AM      Result Value Range Status   Specimen Description BLOOD RIGHT HAND   Final   Special Requests BOTTLES DRAWN AEROBIC AND ANAEROBIC EA   Final   Culture  Setup Time     Final   Value: 12/05/2012 13:28     Performed at [facility]   Culture     Final   Value: NO GROWTH 5 DAYS     Performed at [facility]   Report Status 12/11/2012 FINAL   Final  CULTURE, BLOOD (ROUTINE X 2)     Status: None   Collection Time    12/05/12  8:50 AM      Result Value Range Status   Specimen Description BLOOD LEFT WRIST   Final   Special Requests BOTTLES DRAWN AEROBIC AND ANAEROBIC 5CC EA   Final   Culture  Setup Time     Final   Value: 12/05/2012 13:28     Performed at [facility]   Culture     Final   Value: NO GROWTH 5 DAYS   Performed at [facility]   Report Status 12/11/2012 FINAL   Final  URINE CULTURE     Status: None   Collection Time    12/05/12 10:58 AM      Result Value Range Status   Specimen Description URINE, CATHETERIZED   Final   Special Requests NONE   Final   Culture  Setup Time     Final   Value: 12/05/2012 15:33     Performed at [facility] Count     Final   Value: NO GROWTH     Performed at [facility]   Culture     Final   Value: NO GROWTH     Performed at [facility]   Report Status 12/06/2012 FINAL   Final  CLOSTRIDIUM DIFFICILE BY PCR     Status: Abnormal   Collection Time    12/05/12 12:38 PM      Result Value Range Status   C difficile by pcr POSITIVE (*) NEGATIVE Final   Comment: CRITICAL RESULT CALLED TO, READ BACK BY AND VERIFIED WITH:     Garey Ham RN 15:15 12/05/12 ([email or web address])     Performed at [facility]  MRSA PCR SCREENING  Status: None   Collection Time    12/05/12  1:26 PM      Result Value Range Status   MRSA by PCR NEGATIVE  NEGATIVE Final   Comment:            The GeneXpert MRSA Assay (FDA     approved for NASAL specimens     only), is one component of a     comprehensive MRSA colonization     surveillance program. It is not     intended to diagnose MRSA     infection nor to guide or     monitor treatment for     MRSA infections.  CULTURE, BLOOD (ROUTINE X 2)     Status: None   Collection Time    12/10/12 10:40 AM      Result Value Range Status   Specimen Description BLOOD LEFT ARM   Final   Special Requests BOTTLES DRAWN AEROBIC AND ANAEROBIC 2CC   Final   Culture  Setup Time     Final   Value: 12/10/2012 12:57     Performed at [facility]   Culture     Final   Value:        BLOOD CULTURE RECEIVED NO GROWTH TO DATE CULTURE WILL BE HELD FOR 5 DAYS BEFORE ISSUING A FINAL NEGATIVE REPORT     Performed at [facility]   Report Status PENDING   Incomplete  CULTURE, BLOOD (ROUTINE X  2)     Status: None   Collection Time    12/10/12 10:40 AM      Result Value Range Status   Specimen Description BLOOD RIGHT ARM   Final   Special Requests BOTTLES DRAWN AEROBIC AND ANAEROBIC 5CC   Final   Culture  Setup Time     Final   Value: 12/10/2012 12:57     Performed at [facility]   Culture     Final   Value:        BLOOD CULTURE RECEIVED NO GROWTH TO DATE CULTURE WILL BE HELD FOR 5 DAYS BEFORE ISSUING A FINAL NEGATIVE REPORT     Performed at [facility]   Report Status PENDING   Incomplete    Anti-infectives   Start     Dose/Rate Route Frequency Ordered Stop   12/13/12 1200  vancomycin (VANCOCIN) IVPB 1000 mg/200 mL premix     1,000 mg 200 mL/hr over 60 Minutes Intravenous Every 12 hours 12/13/12 0531     12/11/12 1700  micafungin (MYCAMINE) 100 mg in sodium chloride 0.9 % 100 mL IVPB     100 mg 100 mL/hr over 1 Hours Intravenous Every 24 hours 12/11/12 1639     12/11/12 1645  micafungin (MYCAMINE) 100 mg in sodium chloride 0.9 % 100 mL IVPB  Status:  Discontinued     100 mg 100 mL/hr over 1 Hours Intravenous Daily 12/11/12 1639 12/11/12 1643   12/10/12 1200  vancomycin (VANCOCIN) 1,250 mg in sodium chloride 0.9 % 250 mL IVPB  Status:  Discontinued     1,250 mg 166.7 mL/hr over 90 Minutes Intravenous Every 12 hours 12/10/12 1027 12/13/12 0530   12/09/12 1200  vancomycin (VANCOCIN) 50 mg/mL oral solution 500 mg     500 mg Per Tube 4 times per day 12/09/12 1142     12/06/12 0600  vancomycin (VANCOCIN) 1,250 mg in sodium chloride 0.9 % 250 mL IVPB  Status:  Discontinued     1,250 mg  166.7 mL/hr over 90 Minutes Intravenous Every 24 hours 12/05/12 1006 12/05/12 1628   12/05/12 1800  vancomycin (VANCOCIN) 500 mg in sodium chloride irrigation 0.9 % 100 mL ENEMA  Status:  Discontinued     500 mg Rectal 4 times per day 12/05/12 1424 12/11/12 1037   12/05/12 1600  metroNIDAZOLE (FLAGYL) IVPB 500 mg     500 mg 100 mL/hr over 60 Minutes Intravenous Every 8 hours  12/05/12 1424 12/19/12 1559   12/05/12 1430  vancomycin (VANCOCIN) 50 mg/mL oral solution 500 mg  Status:  Discontinued     500 mg Per Tube 4 times per day 12/05/12 1424 12/05/12 1425   12/05/12 1015  piperacillin-tazobactam (ZOSYN) IVPB 3.375 g     3.375 g 12.5 mL/hr over 240 Minutes Intravenous Every 8 hours 12/05/12 1006     12/05/12 0845  vancomycin (VANCOCIN) IVPB 1000 mg/200 mL premix     1,000 mg 200 mL/hr over 60 Minutes Intravenous STAT 12/05/12 0830 12/05/12 1017   12/05/12 0830  piperacillin-tazobactam (ZOSYN) IVPB 3.375 g     3.375 g 100 mL/hr over 30 Minutes Intravenous STAT 12/05/12 0806 12/05/12 1017   12/05/12 0815  piperacillin-tazobactam (ZOSYN) IVPB 3.375 g  Status:  Discontinued     3.375 g 12.5 mL/hr over 240 Minutes Intravenous  Once 12/05/12 0804 12/05/12 0805   12/05/12 0815  vancomycin (VANCOCIN) IVPB 1000 mg/200 mL premix  Status:  Discontinued     1,000 mg 200 mL/hr over 60 Minutes Intravenous  Once 12/05/12 0813 12/05/12 0813      Assessment: Patient with high vancomycin level.  Dose hung before level returned, about 50  Left.  Goal of Therapy:  Vancomycin trough level 15-20 mcg/ml  Plan:  Measure antibiotic drug levels at steady state Follow up culture results Change to Vancomycin 1gm iv q12hr  Victor Little 12/13/2012,5:34 AM

## 2012-12-13 NOTE — Progress Notes (Signed)
PULMONARY  / CRITICAL CARE MEDICINE  Name: Victor Little MRN: [id number] DOB: 07/28/1945    ADMISSION DATE:  12/05/2012 CONSULTATION DATE:  12/07/12  REFERRING MD :  Penny Pia, MD PRIMARY SERVICE: PCCM  CHIEF COMPLAINT:  Acute respiratory failure.   BRIEF PATIENT DESCRIPTION:  67 years old male resident of a nursing home with PMH relevant for mental retardation, deafness, HTN. Admid cdiff, aspiration, vent.  SIGNIFICANT EVENTS / STUDIES:  11/22:  Intubation 11/24:  Remains vented, weaning 11/26   ct head>>> no acute 11/26  CT chest aspiration infil rt, small effusions, free air 11/26:  CT abdomen with free air in abdomen, no pressors 11/28:  CXR without interval changes, free air  LINES / TUBES: Lef IJ CVC 12/07/12>>> ETT 11/22 >>> 1126  CULTURES: 11/20:  Blood culture >>> NGTD 11/20:  MRSA PCR >>> Negative 11/21:  Urine culture >>> Negative, Final 11/20:  C Diff PCR - Positive  ANTIBIOTICS: Zosyn:    11/20 >>> IV Flagyl:    11/20 >>> Rectal vancomycin:   11/20 >>> 11/26 Vancomycin PO 11/24 >>> Vancomycin IV 11/25 >>> Micafungin  11/26 >>>  SUBJECTIVE/OVERNIGHT/INTERVAL HX  No changes on physical exam at bedside.  Remains on vent with minimal vent support for oxygenation.  Minimal interaction.  Arousable but not meaningful interaction.  VITAL SIGNS: Temp:  [99.5 F (37.5 C)-100 F (37.8 C)] 99.7 F (37.6 C) (11/28 0600) Pulse Rate:  [71-100] 79 (11/28 0600) Resp:  [16-20] 17 (11/28 0600) BP: (139-165)/(70-89) 165/81 mmHg (11/28 0600) SpO2:  [95 %-99 %] 98 % (11/28 0600) FiO2 (%):  [40 %] 40 % (11/28 0600) Weight:  [87.5 kg (192 lb 14.4 oz)] 87.5 kg (192 lb 14.4 oz) (11/28 0100) HEMODYNAMICS:   VENTILATOR SETTINGS: Vent Mode:  [-] PRVC FiO2 (%):  [40 %] 40 % Set Rate:  [16 bmp] 16 bmp Vt Set:  [620 mL] 620 mL PEEP:  [5 cmH20] 5 cmH20 Plateau Pressure:  [13 cmH20-18 cmH20] 15 cmH20 INTAKE / OUTPUT: Intake/Output     11/27 0701 - 11/28 0700 11/28  0701 - 11/29 0700   I.V. (mL/kg) 820 (9.4)    NG/GT 150    IV Piggyback 1325    TPN 648    Total Intake(mL/kg) 2943 (33.6)    Urine (mL/kg/hr) 1380 (0.7)    Emesis/NG output 1600 (0.8)    Stool     Total Output 2980     Net -37          Stool Occurrence 5 x      PHYSICAL EXAMINATION: General: Vented, not follow commands in light of deafness Neuro: Vented and sedated, RASS at 0 to -1, continue to resist eyelids retraction, responsive to calling and painful stimuli HEENT: NCAT, eye close mostly, no discharge    ET tube in mouth PULM:  Coarse breath sound bilaterally, no wheeze CV: RRR, s1 and s2 heard, no murmur/rub/gallop GI: Soft, slight distention, (+) BS, slight reaction from patient to palpation Extremities: generalized swelling, no pitting edema, equal pulses, warm to touch  LABS: PULMONARY  Recent Labs Lab 12/07/12 1800 12/10/12 0432  PHART 7.369 7.411  PCO2ART 38.8 31.4*  PO2ART 197.0* 132.0*  HCO3 21.8 19.2*  TCO2 19.3 17.2  O2SAT 99.3 98.5    CBC  Recent Labs Lab 12/11/12 0532 12/12/12 0400 12/13/12 0530  HGB 12.0* 12.2* 11.3*  HCT 35.2* 36.3* 33.1*  WBC 13.9* 15.2* 15.4*  PLT 258 327 281    COAGULATION No results found for  this basename: INR,  in the last 168 hours  CARDIAC  No results found for this basename: TROPONINI,  in the last 168 hours  Recent Labs Lab 12/07/12 0335  PROBNP 680.8*     CHEMISTRY  Recent Labs Lab 12/09/12 0530 12/10/12 0554 12/11/12 0532 12/12/12 0400 12/13/12 0530  NA 143 142 143 141 142  K 4.0 3.7 3.6 3.1* 3.4*  CL 115* 111 111 108 111  CO2 21 20 20 21 23   GLUCOSE 95 99 107* 99 134*  BUN 15 13 17 19 20   CREATININE 1.15 1.03 0.99 0.99 1.00  CALCIUM 7.2* 7.5* 7.5* 7.4* 7.4*  MG 1.9 2.0 2.0 1.8 2.1  PHOS 2.1* 3.2 3.1 3.0 2.9   Estimated Creatinine Clearance: 79.8 ml/min (by C-G formula based on Cr of 1).   LIVER  Recent Labs Lab 12/08/12 0630 12/10/12 0554 12/13/12 0530  AST 24 25 17   ALT  12 13 11   ALKPHOS 62 66 59  BILITOT 1.1 0.7 0.5  PROT 4.9* 5.4* 5.3*  ALBUMIN 2.0* 2.2* 1.9*     INFECTIOUS  Recent Labs Lab 12/07/12 0335 12/07/12 1000  LATICACIDVEN  --  1.3  PROCALCITON 1.61  --      ENDOCRINE CBG (last 3)   Recent Labs  12/12/12 2003 12/12/12 2339 12/13/12 0423  GLUCAP 107* 118* 126*     IMAGING x48h  Ct Head Wo Contrast  12/11/2012   CLINICAL DATA:  Mental status changes  EXAM: CT HEAD WITHOUT CONTRAST  TECHNIQUE: Contiguous axial images were obtained from the base of the skull through the vertex without intravenous contrast.  COMPARISON:  Brain MRI 01/15/2008  FINDINGS: There is no evidence of acute hemorrhage. Chronic changes are again appreciated within the left cerebral hemisphere as well as within the posterior fossa unchanged when correlated with prior MRI. No new intra-axial nor extra-axial fluid collection identified. Ventricles cisterns are patent. There is a component of atrophy within the right cerebral hemisphere, mild. Mild areas of low attenuation project within the subcortical, deep, and periventricular white matter regions. The visualized paranasal sinuses and mastoid air cells are patent. There is no evidence of a depressed skull fracture.  IMPRESSION: Chronic an involutional changes without evidence of acute abnormalities.   Electronically Signed   By: Salome Holmes M.D.   On: 12/11/2012 11:56   Ct Chest Wo Contrast  12/11/2012   CLINICAL DATA:  Infiltrates on chest x-ray  EXAM: CT CHEST WITHOUT CONTRAST  TECHNIQUE: Multidetector CT imaging of the chest was performed following the standard protocol without IV contrast.  COMPARISON:  Chest x-ray from 12/10/2012.  FINDINGS: Endotracheal tube tip is 5.9 cm above the base of the carina. The NG tube tip has not been included on the film but is at least in the very distal stomach and possibly transpyloric. Left IJ central line tip is in the left innominate vein.  There is no axillary  lymphadenopathy. No mediastinal lymphadenopathy. No bulky left hilar lymphadenopathy. Airspace disease in the right lung tracks centrally into the right hilum which could obscure hilar lymphadenopathy. There is a small to moderate right pleural effusion with a small left pleural effusion.  Lung windows demonstrate central airspace disease in the right upper lobe with central alveolar opacity in the right middle and lower lobes as well also with a central predominance. The left lung is relatively clear was only some patchy minimal central airspace disease the left lower lobe. There is dependent collapse/ consolidation in both lower lobes, adjacent  to the fluid, right greater than left.  Images which include the upper abdomen show intraperitoneal free air  Bone windows reveal no worrisome lytic or sclerotic osseous lesions.  IMPRESSION: 1. Intraperitoneal free air. 2. Central airspace disease in the right upper middle and lower lobes with relative sparing of the left lung. Imaging features could be compatible with aspiration and/or asymmetric infection. 3. Bilateral pleural effusions, small to moderate in character, right greater than left.  Critical Value/emergent results were called by telephone at the time of interpretation on 12/11/2012 at 12:45 PM to Dr.Amoree Newlon [facility] , who verbally acknowledged these results.   Electronically Signed   By: Kennith Center M.D.   On: 12/11/2012 12:46   Ct Abdomen Pelvis W Contrast  12/11/2012   CLINICAL DATA:  Assess perforation  EXAM: CT ABDOMEN AND PELVIS WITH CONTRAST  TECHNIQUE: Multidetector CT imaging of the abdomen and pelvis was performed using the standard protocol following bolus administration of intravenous contrast.  CONTRAST:  OMNIPAQUE IOHEXOL 300 MG/ML  SOLN  COMPARISON:  12/05/2012  FINDINGS: Small bilateral pleural effusions are identified. There is atelectasis versus consolidative infiltrates within the lung bases. An ill-defined area of airspace  disease projects in the base of the right middle lobe.  A moderate to large amount of pneumoperitoneum is identified along the nondependent portion of the abdomen. Punctate foci of air project within the gallbladder fossa. An NG tube is seen with tip projecting region of the distal stomach. Loculated focus of air projects adjacent to the splenic flexure of the colon. Image number 27 series 2.  A 1.9 cm irregularly bordered low attenuating nodule is appreciated within the left lobe of the liver image number 17 series 2. Mild intrahepatic biliary ductal dilatation appreciated. The spleen, adrenals, pancreas, kidneys are unremarkable.  Multiple dilated loops of small bowel are appreciated throughout the mid abdomen and pelvis containing air, nondilute and dilute contrast. Air is appreciated within the nondilated loops of colon as well as fluid within loops of large bowel. The areas of wall thickening involving the sigmoid colon extending into the rectal sigmoid and anal regions. There is inflammation within the surrounding mesenteric fat. . Within the pelvis adjacent to the distal sigmoid colon a small ill-defined low attenuating nodule is appreciated measuring 2 x 2.2 cm in AP by transverse dimensions image 86 series 2.  There is no evidence of drainable loculated fluid collections nor significant free fluid within the abdomen or pelvis.  There is no evidence of abdominal aortic aneurysm. The celiac, SMA, IMA, portal vein are opacified.  IMPRESSION: 1. Moderate to large amount of pneumoperitoneum, consistent with ruptured hollow viscus until proven otherwise prickly in the absence of known iatrogenic intervention. . A focal loculated region of extraluminal air projects adjacent to the splenic flexure. The bowel in this area is not appear inflamed. This area cannot be excluded as the source. 2. Findings consistent with an ileus versus a partial small bowel obstruction 3. Low attenuating nodule left lobe of the liver  concerning for a small liver mass 4. Soft tissue nodule in the pelvis adjacent to the sigmoid colon is may represent a lymph node the weight 9 nodal soft tissue nodules of diagnostic consideration 5. Findings again consistent with colitis involving the sigmoid colon with extension into the rectal anal region 6. Small bilateral effusions as well as atelectasis versus infiltrate within the lung bases and airspace disease, pneumonitis, in the right middle lobe. 7. Dr. Jenne Campus the ICU attending was informed of  these findings at the time of the interpretation on 12/11/2012 at 5:19 p.m.   Electronically Signed   By: Salome Holmes M.D.   On: 12/11/2012 17:21   Dg Chest Port 1 View  12/13/2012   CLINICAL DATA:  Check endotracheal tube position  EXAM: PORTABLE CHEST - 1 VIEW  COMPARISON:  12/10/2012  FINDINGS: Endotracheal tube is again identified 6.6 cm above the carina in satisfactory position. A left jugular central line is noted with the tip in the proximal superior vena cava. Nasogastric catheter is seen within the stomach. Cardiac shadow is stable. Persistent bibasilar changes are noted right greater than left no new focal abnormality is noted.  IMPRESSION: No significant change from this prior exam. The endotracheal tube and left jugular line are in satisfactory position.   Electronically Signed   By: Alcide Clever M.D.   On: 12/13/2012 07:09   ASSESSMENT / PLAN:  PULMONARY A: Acute hypoxemic respiratory failure Aspiration PNA  P:   - Will keep on vent in case of surgery and unresolved encephalopathy - Wean cpap5 ps 5-10, goal 4 hrs - CXR without interval changes -improved slight rt base on pcxr  CARDIOVASCULAR A:  Hypertension - Adequate control  P:  - KVO IFV though net positive still - prn IV Metoprolol - Consider Lasix for negative balance  RENAL A:   Acute Renal Failure - Resolved Lactic Acidosis - Resolved Volume Overload Hypokalemia  P:   - Lasix started, dc further -  Potassium low, will replace  GASTROINTESTINAL A:   C. Diff colitis SBO vs ileus - Resolving Pneumoperitoneum on CT 11/26  --> Surgery reconsulted, have evaluated patient  P:   - PPI change to H2 antogonist in C. Diff setting - Tube feeding held due to CT findings - Continue Flagyl and Oral Vancomycin, however may need to dc all orals - Per surgical note:  --> Patient abdomen seem to be improved  --> Continue to hold surgical intervention, medical management and observation  - TPN initiated  HEMATOLOGIC A:   Leukocytosis - Stable  P:  - Secondary to C. Diff infection and now questionable intraabdominal  - Will follow CBC, PRBC for hgb < 7  INFECTIOUS A:   Aspiration pneumonitis/PNA C. Diff Colitis Pneumoperitoneum - ?Peritonitis>  P:   - IV Flagyl, Oral Vancomycin for C. Diff - Vancomycin and Zosyn for PNA, Vancomycin - Fever resolved and WBC remains high and stable - Maintain empiric antifungal  ENDOCRINE A:   At risk hyperglycemia  P: - CBG  NEUROLOGIC A:   Encephalopathy - GI driven process abdo likely  --> Mental retardation  --> Deafness  --> Sedation  --> Normal CT Head  P:   - Will optimize sedation to RASS 0 to -1 - Propofol, WUA mandatory - Fentanyl   The patient is critically ill with multiple organ systems failure and requires high complexity decision making for assessment and support, frequent evaluation and titration of therapies, application of advanced monitoring technologies and extensive interpretation of multiple databases.   Critical Care Time devoted to patient care services described in this note is  35  Minutes.   Mcarthur Rossetti. Tyson Alias, MD, FACP Pgr: [phone] [facility]

## 2012-12-13 NOTE — Progress Notes (Signed)
NUTRITION FOLLOW UP/NEW TPN  Intervention:   - TPN per pharmacy - Will continue to monitor   Nutrition Dx:   Inadequate oral intake related to altered GI function as evidenced by npo status and vent; ongoing  Goal:   TF tolerance and to meet >90% of estimated nutritional needs - not met r/t TF not being initiated  New goal: TPN to meet >90% of estimated nutritional needs  Monitor:   Weights, labs, TPN, vent status   Assessment:   67 years old male resident of a nursing home with PMH relevant for mental retardation, deafness, HTN. Admid cdiff, aspiration, vent.  11/25 - Per RN pt is being weaned off of propofol; unable to wean pt off vent yesterday but will re-attempt to wean again today. Per RN pt receiving Vanc enemas QID with minimal stool output (smear BM today) and NG tube has put out 1200 ml since admission. Per MD surgery note, bowel sounds are normal, x-ray is consistent with diffuse ileus, no evidence of obstruction.   11/26 - Extubated considered if pt becomes more awake. Found to have C. Difficile with diarrhea like stool. RD consulted for TF initiation/management. Weight up 20 pounds since admit. Per conversation with RN, TF not started due to CT showing free air in abdomen with concern for pneumoperitoneum.   11/28 - TPN started yesterday r/t pneumoperitoneum on CT, probable ruptured viscus, ileus vs partial SBO, colitis with probable GI perforation per pharmD notes.   - Potassium slightly low, getting IV replacement - PALB low  - CBGs < 150 mg/dL   Patient is currently intubated on ventilator support.  MV: 10.2 L/min Temp:Temp (24hrs), Avg:99.8 F (37.7 C), Min:99.5 F (37.5 C), Max:100 F (37.8 C)  Propofol: not running    Height: Ht Readings from Last 1 Encounters:  12/07/12 6' (1.829 m)    Weight Status:   Wt Readings from Last 1 Encounters:  12/13/12 192 lb 14.4 oz (87.5 kg)  Admit wt:        182 lb 5 oz (82.6 kg)   Net I/Os: +2.8L   Re-estimated  needs:  Kcal: 2046 Protein: 110-125 grams Fluid: >2 L/day  Skin: Non-pitting RUE, LUE edema, +1 RLE, LLE edema  Diet Order: NPO   Intake/Output Summary (Last 24 hours) at 12/13/12 1040 Last data filed at 12/13/12 0700  Gross per 24 hour  Intake 2650.5 ml  Output   2795 ml  Net -144.5 ml    Last BM: 11/27   Labs:   Recent Labs Lab 12/11/12 0532 12/12/12 0400 12/13/12 0530  NA 143 141 142  K 3.6 3.1* 3.4*  CL 111 108 111  CO2 20 21 23   BUN 17 19 20   CREATININE 0.99 0.99 1.00  CALCIUM 7.5* 7.4* 7.4*  MG 2.0 1.8 2.1  PHOS 3.1 3.0 2.9  GLUCOSE 107* 99 134*    CBG (last 3)   Recent Labs  12/12/12 2339 12/13/12 0423 12/13/12 0833  GLUCAP 118* 126* 128*    Scheduled Meds: . antiseptic oral rinse  1 application Mouth Rinse QID  . chlorhexidine  15 mL Mouth/Throat BID  . famotidine (PEPCID) IV  20 mg Intravenous Q24H  . furosemide  40 mg Intravenous Q12H  . insulin aspart  0-9 Units Subcutaneous Q4H  . metronidazole  500 mg Intravenous Q8H  . micafungin (MYCAMINE) IV  100 mg Intravenous Q24H  . piperacillin-tazobactam (ZOSYN)  IV  3.375 g Intravenous Q8H  . potassium chloride  10 mEq Intravenous Q1  Hr x 4  . potassium chloride  20 mEq Per Tube Q4H  . sodium chloride  3 mL Intravenous Q12H  . vancomycin  500 mg Per Tube Q6H  . vancomycin  1,000 mg Intravenous Q12H    Continuous Infusions: . Marland KitchenTPN (CLINIMIX-E) Adult 40 mL/hr at 12/12/12 1812   And  . fat emulsion 250 mL (12/12/12 1812)  . propofol Stopped (12/10/12 1010)    Levon Hedger MS, RD, LDN [phone] Pager [phone] After Hours Pager

## 2012-12-13 NOTE — Progress Notes (Signed)
PARENTERAL NUTRITION CONSULT NOTE - INITIAL  Pharmacy Consult for TNA Indication: NPO  No Known Allergies  Patient Measurements: Height: 6' (182.9 cm) Weight: 192 lb 14.4 oz (87.5 kg) IBW/kg (Calculated) : 77.6 Adjusted Body Weight: 81 kg Weight PTA: 92 kg  Vital Signs: Temp: 99.9 F (37.7 C) (11/28 0800) Temp src: Core (Comment) (11/28 0431) BP: 149/76 mmHg (11/28 0902) Pulse Rate: 78 (11/28 0902) Intake/Output from previous day: 11/27 0701 - 11/28 0700 In: 2943 [I.V.:820; NG/GT:150; IV Piggyback:1325; TPN:648] Out: 2980 [Urine:1380; Emesis/NG output:1600] Intake/Output from this shift:    Labs:  Recent Labs  12/11/12 0532 12/12/12 0400 12/13/12 0530  WBC 13.9* 15.2* 15.4*  HGB 12.0* 12.2* 11.3*  HCT 35.2* 36.3* 33.1*  PLT 258 327 281     Recent Labs  12/11/12 0532 12/12/12 0400 12/13/12 0530  NA 143 141 142  K 3.6 3.1* 3.4*  CL 111 108 111  CO2 20 21 23   GLUCOSE 107* 99 134*  BUN 17 19 20   CREATININE 0.99 0.99 1.00  CALCIUM 7.5* 7.4* 7.4*  MG 2.0 1.8 2.1  PHOS 3.1 3.0 2.9  PROT  --   --  5.3*  ALBUMIN  --   --  1.9*  AST  --   --  17  ALT  --   --  11  ALKPHOS  --   --  59  BILITOT  --   --  0.5  PREALBUMIN  --   --  7.8*  TRIG  --   --  102   Estimated Creatinine Clearance: 79.8 ml/min (by C-G formula based on Cr of 1).    Recent Labs  12/12/12 2339 12/13/12 0423 12/13/12 0833  GLUCAP 118* 126* 128*   Medical History: Past Medical History  Diagnosis Date  . Mental retardation   . Organic brain syndrome   . Hypertension   . Deaf    Medications:  Scheduled:  . antiseptic oral rinse  1 application Mouth Rinse QID  . chlorhexidine  15 mL Mouth/Throat BID  . famotidine (PEPCID) IV  20 mg Intravenous Q24H  . insulin aspart  0-9 Units Subcutaneous Q4H  . metronidazole  500 mg Intravenous Q8H  . micafungin (MYCAMINE) IV  100 mg Intravenous Q24H  . piperacillin-tazobactam (ZOSYN)  IV  3.375 g Intravenous Q8H  . potassium chloride   20 mEq Per Tube Q4H  . sodium chloride  3 mL Intravenous Q12H  . vancomycin  500 mg Per Tube Q6H  . vancomycin  1,000 mg Intravenous Q12H   Infusions:  . Marland KitchenTPN (CLINIMIX-E) Adult 40 mL/hr at 12/12/12 1812   And  . fat emulsion 250 mL (12/12/12 1812)  . propofol Stopped (12/10/12 1010)   Insulin Requirements in the past 24 hours:  No hx of DM, 2 units novolog  Current Nutrition:  None, NPO since admit 11/20  Assessment: 67 yo with hx HTN, mental retardation, deafness who presented to ER from group home 11/20 s/p 12 hours of vomiting and diarrhea and initial BP of 81/50. Complaint of RLQ pain, found positive for CDiff. Pneumoperitoneum on CT, probable ruptured viscus, ileus vs partial SBO, colitis. Plan per CCS: observe x 24 hr, re-evaluate for need for laparotomy, unless clinical picture worsens. Tube feed not begun with probable GI perf, begin TNA via TL central line placed 11/22.   11/28: Remains on broad spectrum abx/antifungals for ? Perforation and CDiff. Diarrhea improved per surgery notes.  CXR today w/o interval changes, still shows free air.  Holding off on surgery unless clinically deteriorates.   Lytes: K improved @ 3.4.  Mg and other lytes now WNL. Corr Ca ok.  Potassium replacement ordered already with KCl x 2 and KCl IV x 4 today (80 mEq total)  LFT's WNL  Triglycerides: 11/28 = 102  Pre-albumin: 11/28 = 7.8  Propofol OFF 11/25  No IV fluids running  Nutritional Goals/ 24 hr: per RD Kcal: 2070  Protein: 110-125 grams  Fluid: 2.2-2.5 L/day  -Aim for goal rate Clinimix E 5/15 of 90 ml/hr, 20% lipids daily at 42ml/hr to deliver 108 gm Protein, 2013 kCal per 24 hr  Plan:  At 1800 today:   Increase Clinimix E 5/15 to 60 ml/hr  Lipids 20% at 10 ml/hr daily  TNA labs Mon, Thursday  MVI & trace elements daily  Continue Novolog scale sensitive q4 hr  Haynes Hoehn, PharmD 12/13/2012, 11:06 AM  Pager: [phone]

## 2012-12-13 NOTE — Progress Notes (Signed)
Patient ID: Victor Little, male   DOB: 11/10/1945, 67 y.o.   MRN: [id number]  General Surgery - [facility] - Progress Note  Subjective: Patient remains in ICU on vent.  Appears to be resting comfortably.  Diarrhea improved.  Objective: Vital signs in last 24 hours: Temp:  [99.5 F (37.5 C)-100 F (37.8 C)] 99.7 F (37.6 C) (11/28 0600) Pulse Rate:  [71-100] 79 (11/28 0600) Resp:  [16-20] 17 (11/28 0600) BP: (139-165)/(70-89) 165/81 mmHg (11/28 0600) SpO2:  [95 %-99 %] 98 % (11/28 0600) FiO2 (%):  [40 %] 40 % (11/28 0600) Weight:  [192 lb 14.4 oz (87.5 kg)] 192 lb 14.4 oz (87.5 kg) (11/28 0100) Last BM Date: 12/12/12  Intake/Output from previous day: 11/27 0701 - 11/28 0700 In: 2943 [I.V.:820; NG/GT:150; IV Piggyback:1325; TPN:648] Out: 2980 [Urine:1380; Emesis/NG output:1600]  Exam: HEENT - clear, not icteric Neck - soft Chest - coarse bilaterally Cor - RRR, rate in 70's Abd - soft, mild distension; BS present; mild to moderate tenderness (?) lower abdomen, right greater than left quadrant; no mass palpable Ext - trace edema at ankles  Lab Results:   Recent Labs  12/12/12 0400 12/13/12 0530  WBC 15.2* 15.4*  HGB 12.2* 11.3*  HCT 36.3* 33.1*  PLT 327 281     Recent Labs  12/12/12 0400 12/13/12 0530  NA 141 142  K 3.1* 3.4*  CL 108 111  CO2 21 23  GLUCOSE 99 134*  BUN 19 20  CREATININE 0.99 1.00  CALCIUM 7.4* 7.4*    Studies/Results: Ct Head Wo Contrast  12/11/2012   CLINICAL DATA:  Mental status changes  EXAM: CT HEAD WITHOUT CONTRAST  TECHNIQUE: Contiguous axial images were obtained from the base of the skull through the vertex without intravenous contrast.  COMPARISON:  Brain MRI 01/15/2008  FINDINGS: There is no evidence of acute hemorrhage. Chronic changes are again appreciated within the left cerebral hemisphere as well as within the posterior fossa unchanged when correlated with prior MRI. No new intra-axial nor extra-axial  fluid collection identified. Ventricles cisterns are patent. There is a component of atrophy within the right cerebral hemisphere, mild. Mild areas of low attenuation project within the subcortical, deep, and periventricular white matter regions. The visualized paranasal sinuses and mastoid air cells are patent. There is no evidence of a depressed skull fracture.  IMPRESSION: Chronic an involutional changes without evidence of acute abnormalities.   Electronically Signed   By: Salome Holmes M.D.   On: 12/11/2012 11:56   Ct Chest Wo Contrast  12/11/2012   CLINICAL DATA:  Infiltrates on chest x-ray  EXAM: CT CHEST WITHOUT CONTRAST  TECHNIQUE: Multidetector CT imaging of the chest was performed following the standard protocol without IV contrast.  COMPARISON:  Chest x-ray from 12/10/2012.  FINDINGS: Endotracheal tube tip is 5.9 cm above the base of the carina. The NG tube tip has not been included on the film but is at least in the very distal stomach and possibly transpyloric. Left IJ central line tip is in the left innominate vein.  There is no axillary lymphadenopathy. No mediastinal lymphadenopathy. No bulky left hilar lymphadenopathy. Airspace disease in the right lung tracks centrally into the right hilum which could obscure hilar lymphadenopathy. There is a small to moderate right pleural effusion with a small left pleural effusion.  Lung windows demonstrate central airspace disease in the right upper lobe with central alveolar opacity in the right middle and lower lobes as well also with  a central predominance. The left lung is relatively clear was only some patchy minimal central airspace disease the left lower lobe. There is dependent collapse/ consolidation in both lower lobes, adjacent to the fluid, right greater than left.  Images which include the upper abdomen show intraperitoneal free air  Bone windows reveal no worrisome lytic or sclerotic osseous lesions.  IMPRESSION: 1. Intraperitoneal free air.  2. Central airspace disease in the right upper middle and lower lobes with relative sparing of the left lung. Imaging features could be compatible with aspiration and/or asymmetric infection. 3. Bilateral pleural effusions, small to moderate in character, right greater than left.  Critical Value/emergent results were called by telephone at the time of interpretation on 12/11/2012 at 12:45 PM to Dr[facility] , who verbally acknowledged these results.   Electronically Signed   By: Kennith Center M.D.   On: 12/11/2012 12:46   Ct Abdomen Pelvis W Contrast  12/11/2012   CLINICAL DATA:  Assess perforation  EXAM: CT ABDOMEN AND PELVIS WITH CONTRAST  TECHNIQUE: Multidetector CT imaging of the abdomen and pelvis was performed using the standard protocol following bolus administration of intravenous contrast.  CONTRAST:  OMNIPAQUE IOHEXOL 300 MG/ML  SOLN  COMPARISON:  12/05/2012  FINDINGS: Small bilateral pleural effusions are identified. There is atelectasis versus consolidative infiltrates within the lung bases. An ill-defined area of airspace disease projects in the base of the right middle lobe.  A moderate to large amount of pneumoperitoneum is identified along the nondependent portion of the abdomen. Punctate foci of air project within the gallbladder fossa. An NG tube is seen with tip projecting region of the distal stomach. Loculated focus of air projects adjacent to the splenic flexure of the colon. Image number 27 series 2.  A 1.9 cm irregularly bordered low attenuating nodule is appreciated within the left lobe of the liver image number 17 series 2. Mild intrahepatic biliary ductal dilatation appreciated. The spleen, adrenals, pancreas, kidneys are unremarkable.  Multiple dilated loops of small bowel are appreciated throughout the mid abdomen and pelvis containing air, nondilute and dilute contrast. Air is appreciated within the nondilated loops of colon as well as fluid within loops of large bowel.  The areas of wall thickening involving the sigmoid colon extending into the rectal sigmoid and anal regions. There is inflammation within the surrounding mesenteric fat. . Within the pelvis adjacent to the distal sigmoid colon a small ill-defined low attenuating nodule is appreciated measuring 2 x 2.2 cm in AP by transverse dimensions image 86 series 2.  There is no evidence of drainable loculated fluid collections nor significant free fluid within the abdomen or pelvis.  There is no evidence of abdominal aortic aneurysm. The celiac, SMA, IMA, portal vein are opacified.  IMPRESSION: 1. Moderate to large amount of pneumoperitoneum, consistent with ruptured hollow viscus until proven otherwise prickly in the absence of known iatrogenic intervention. . A focal loculated region of extraluminal air projects adjacent to the splenic flexure. The bowel in this area is not appear inflamed. This area cannot be excluded as the source. 2. Findings consistent with an ileus versus a partial small bowel obstruction 3. Low attenuating nodule left lobe of the liver concerning for a small liver mass 4. Soft tissue nodule in the pelvis adjacent to the sigmoid colon is may represent a lymph node the weight 9 nodal soft tissue nodules of diagnostic consideration 5. Findings again consistent with colitis involving the sigmoid colon with extension into the rectal anal region 6.  Small bilateral effusions as well as atelectasis versus infiltrate within the lung bases and airspace disease, pneumonitis, in the right middle lobe. 7. Dr. Jenne Campus the ICU attending was informed of these findings at the time of the interpretation on 12/11/2012 at 5:19 p.m.   Electronically Signed   By: Salome Holmes M.D.   On: 12/11/2012 17:21   Dg Chest Port 1 View  12/13/2012   CLINICAL DATA:  Check endotracheal tube position  EXAM: PORTABLE CHEST - 1 VIEW  COMPARISON:  12/10/2012  FINDINGS: Endotracheal tube is again identified 6.6 cm above the carina in  satisfactory position. A left jugular central line is noted with the tip in the proximal superior vena cava. Nasogastric catheter is seen within the stomach. Cardiac shadow is stable. Persistent bibasilar changes are noted right greater than left no new focal abnormality is noted.  IMPRESSION: No significant change from this prior exam. The endotracheal tube and left jugular line are in satisfactory position.   Electronically Signed   By: Alcide Clever M.D.   On: 12/13/2012 07:09    Assessment / Plan: 1.  Cdiff colitis with pneumoperitoneum  NPO  Vanco IV and PO for Cdiff  Zosyn, Flagyl, and Micafungin for broad coverage - ? Perforation  Discussed with nursing staff - will continue to monitor closely and hold off on laparotomy unless clinically deteriorates  Velora Heckler, MD, [facility] Office: [phone]  12/13/2012

## 2012-12-13 NOTE — Progress Notes (Signed)
[facility] ADULT ICU REPLACEMENT PROTOCOL FOR AM LAB REPLACEMENT ONLY  The patient does apply for the [facility] Adult ICU Electrolyte Replacment Protocol based on the criteria listed below:   1. Is GFR >/= 40 ml/min? yes  Patient's GFR today is 89 2. Is urine output >/= 0.5 ml/kg/hr for the last 6 hours? yes Patient's UOP is .30ml/kg/hr 3. Is BUN < 60 mg/dL? yes  Patient's BUN today is 20 4. Abnormal electrolyte(s): K 3.4 5. Ordered repletion with: per protocol 6. If a panic level lab has been reported, has the CCM MD in charge been notified? yes.   Physician:  Rollene Rotunda 12/13/2012 7:04 AM

## 2012-12-14 DIAGNOSIS — A419 Sepsis, unspecified organism: Secondary | ICD-10-CM

## 2012-12-14 LAB — GLUCOSE, CAPILLARY
Glucose-Capillary: 89 mg/dL (ref 70–99)
Glucose-Capillary: 140 mg/dL — ABNORMAL HIGH (ref 70–99)
Glucose-Capillary: 127 mg/dL — ABNORMAL HIGH (ref 70–99)
Glucose-Capillary: 129 mg/dL — ABNORMAL HIGH (ref 70–99)

## 2012-12-14 LAB — BASIC METABOLIC PANEL
BUN: 20 mg/dL (ref 6–23)
CO2: 27 mEq/L (ref 19–32)
Calcium: 7.6 mg/dL — ABNORMAL LOW (ref 8.4–10.5)
Chloride: 100 mEq/L (ref 96–112)
Creatinine, Ser: 1.11 mg/dL (ref 0.50–1.35)
GFR calc Af Amer: 78 mL/min — ABNORMAL LOW (ref >90)
GFR calc non Af Amer: 67 mL/min — ABNORMAL LOW (ref >90)
Glucose, Bld: 119 mg/dL — ABNORMAL HIGH (ref 70–99)
Potassium: 3.3 mEq/L — ABNORMAL LOW (ref 3.5–5.1)
Sodium: 139 mEq/L (ref 135–145)

## 2012-12-14 MED ORDER — TRACE MINERALS CR-CU-F-FE-I-MN-MO-SE-ZN IV SOLN
INTRAVENOUS | Status: AC
  Administered 2012-12-14: 18:00:00 via INTRAVENOUS
  Filled 2012-12-14: qty 2160

## 2012-12-14 MED ORDER — FAT EMULSION 20 % IV EMUL
250.0000 mL | INTRAVENOUS | Status: AC
  Administered 2012-12-14: 250 mL via INTRAVENOUS
  Filled 2012-12-14: qty 250

## 2012-12-14 MED ORDER — POTASSIUM CHLORIDE 10 MEQ/50ML IV SOLN
10.0000 meq | INTRAVENOUS | Status: AC
  Administered 2012-12-14 (×4): 10 meq via INTRAVENOUS
  Filled 2012-12-14 (×4): qty 50

## 2012-12-14 MED ORDER — POTASSIUM CHLORIDE 10 MEQ/100ML IV SOLN: 10.0000 meq | INTRAVENOUS | Status: DC

## 2012-12-14 NOTE — Progress Notes (Signed)
PT failed SBT due to No PT Effort- worked PS up to 25cm with no change- RT placed PT back on FS- RN aware.

## 2012-12-14 NOTE — Progress Notes (Signed)
PULMONARY  / CRITICAL CARE MEDICINE  Name: Victor Little MRN: [id number] DOB: 1945-12-09    ADMISSION DATE:  12/05/2012 CONSULTATION DATE:  12/07/12  REFERRING MD :  Penny Pia, MD PRIMARY SERVICE: PCCM  CHIEF COMPLAINT:  Acute respiratory failure.   BRIEF PATIENT DESCRIPTION:  67 years old male resident of a group home with PMH relevant for mental retardation, deafness, HTN. Admid cdiff, aspiration, vent.  SIGNIFICANT EVENTS / STUDIES:  11/22:  Intubation 11/24:  Remains vented, weaning 11/26   ct head>>> no acute 11/26  CT chest aspiration infil rt, small effusions, free air 11/26:  CT abdomen with free air in abdomen, no pressors 11/28:  CXR without interval changes, free air  LINES / TUBES: Lef IJ CVC 12/07/12>>> ETT 11/22 >>> 1126  CULTURES: 11/20:  Blood culture >>> NGTD 11/20:  MRSA PCR >>> Negative 11/21:  Urine culture >>> Negative, Final 11/20:  C Diff PCR - Positive  ANTIBIOTICS: Zosyn:    11/20 >>> IV Flagyl:    11/20 >>> Rectal vancomycin:   11/20 >>> 11/26 Vancomycin PO 11/24 >>> Vancomycin IV 11/25 >>> Micafungin  11/26 >>>  SUBJECTIVE/OVERNIGHT/INTERVAL HX  Afebrile Remains on vent with minimal vent support for oxygenation.   Deaf -spont opens eyes Minimal interaction.  Arousable but not meaningful interaction. Good UO with lasix  VITAL SIGNS: Temp:  [99.9 F (37.7 C)-100.2 F (37.9 C)] 100.2 F (37.9 C) (11/29 0800) Pulse Rate:  [69-104] 75 (11/29 0800) Resp:  [16-21] 16 (11/29 0800) BP: (133-158)/(73-84) 138/75 mmHg (11/29 0800) SpO2:  [94 %-98 %] 98 % (11/29 0800) FiO2 (%):  [40 %] 40 % (11/29 0800) Weight:  [83.9 kg (184 lb 15.5 oz)] 83.9 kg (184 lb 15.5 oz) (11/29 0400) HEMODYNAMICS:   VENTILATOR SETTINGS: Vent Mode:  [-] PRVC FiO2 (%):  [40 %] 40 % Set Rate:  [16 bmp] 16 bmp Vt Set:  [620 mL] 620 mL PEEP:  [5 cmH20] 5 cmH20 Pressure Support:  [8 cmH20] 8 cmH20 Plateau Pressure:  [13 cmH20-14 cmH20] 13 cmH20 INTAKE /  OUTPUT: Intake/Output     11/28 0701 - 11/29 0700 11/29 0701 - 11/30 0700   I.V. (mL/kg) 800 (9.5) 40 (0.5)   NG/GT     IV Piggyback 1412.5 100   TPN 1530 140   Total Intake(mL/kg) 3742.5 (44.6) 280 (3.3)   Urine (mL/kg/hr) 6550 (3.3) 135 (0.6)   Emesis/NG output 700 (0.3)    Total Output 7250 135   Net -3507.5 +145          PHYSICAL EXAMINATION: General: Vented, not follow commands in light of deafness Neuro: Vented , RASS at 0 to -1, continue to resist eyelids retraction, responsive to calling and painful stimuli HEENT: NCAT, eye close mostly, no discharge    ET tube in mouth PULM:  Coarse breath sound bilaterally, no wheeze CV: RRR, s1 and s2 heard, no murmur/rub/gallop GI: Soft,non tender, (+) BS, Extremities: generalized swelling, no pitting edema, equal pulses, warm to touch  LABS: PULMONARY  Recent Labs Lab 12/07/12 1800 12/10/12 0432  PHART 7.369 7.411  PCO2ART 38.8 31.4*  PO2ART 197.0* 132.0*  HCO3 21.8 19.2*  TCO2 19.3 17.2  O2SAT 99.3 98.5    CBC  Recent Labs Lab 12/11/12 0532 12/12/12 0400 12/13/12 0530  HGB 12.0* 12.2* 11.3*  HCT 35.2* 36.3* 33.1*  WBC 13.9* 15.2* 15.4*  PLT 258 327 281    COAGULATION No results found for this basename: INR,  in the last 168 hours  CARDIAC  No results found for this basename: TROPONINI,  in the last 168 hours No results found for this basename: PROBNP,  in the last 168 hours   CHEMISTRY  Recent Labs Lab 12/09/12 0530 12/10/12 0554 12/11/12 0532 12/12/12 0400 12/13/12 0530 12/14/12 0520  NA 143 142 143 141 142 139  K 4.0 3.7 3.6 3.1* 3.4* 3.3*  CL 115* 111 111 108 111 100  CO2 21 20 20 21 23 27   GLUCOSE 95 99 107* 99 134* 119*  BUN 15 13 17 19 20 20   CREATININE 1.15 1.03 0.99 0.99 1.00 1.11  CALCIUM 7.2* 7.5* 7.5* 7.4* 7.4* 7.6*  MG 1.9 2.0 2.0 1.8 2.1  --   PHOS 2.1* 3.2 3.1 3.0 2.9  --    Estimated Creatinine Clearance: 71.9 ml/min (by C-G formula based on Cr of  1.11).   LIVER  Recent Labs Lab 12/08/12 0630 12/10/12 0554 12/13/12 0530  AST 24 25 17   ALT 12 13 11   ALKPHOS 62 66 59  BILITOT 1.1 0.7 0.5  PROT 4.9* 5.4* 5.3*  ALBUMIN 2.0* 2.2* 1.9*     INFECTIOUS  Recent Labs Lab 12/07/12 1000  LATICACIDVEN 1.3     ENDOCRINE CBG (last 3)   Recent Labs  12/13/12 2324 12/14/12 0431 12/14/12 0757  GLUCAP 125* 89 133*     IMAGING x48h  Dg Chest Port 1 View  12/13/2012   CLINICAL DATA:  Check endotracheal tube position  EXAM: PORTABLE CHEST - 1 VIEW  COMPARISON:  12/10/2012  FINDINGS: Endotracheal tube is again identified 6.6 cm above the carina in satisfactory position. A left jugular central line is noted with the tip in the proximal superior vena cava. Nasogastric catheter is seen within the stomach. Cardiac shadow is stable. Persistent bibasilar changes are noted right greater than left no new focal abnormality is noted.  IMPRESSION: No significant change from this prior exam. The endotracheal tube and left jugular line are in satisfactory position.   Electronically Signed   By: Alcide Clever M.D.   On: 12/13/2012 07:09   ASSESSMENT / PLAN:  PULMONARY A: Acute hypoxemic respiratory failure Aspiration PNA  P:   - WSBts with goal extubation when awake -CXR -improved slight rt base on pcxr  CARDIOVASCULAR A:  Hypertension - Adequate control  P:  - KVO IFV though net positive still - prn IV Metoprolol - Ct Lasix for negative balance  RENAL A:   Acute Renal Failure - Resolved Lactic Acidosis - Resolved Volume Overload Hypokalemia  P:    - Potassium low, will replace  GASTROINTESTINAL A:   C. Diff colitis SBO vs ileus - Resolving Pneumoperitoneum on CT 11/26  --> Surgery following  P:   - PPI change to H2 antogonist in C. Diff setting - Tube feeding held  - Continue Flagyl and Oral Vancomycin, however may need to dc all orals -  Ct TPN  HEMATOLOGIC A:   Leukocytosis - Stable  Secondary to C.  Diff infection and now questionable intraabdominal  P:  -- Will follow CBC, PRBC for hgb < 7  INFECTIOUS A:   Aspiration pneumonitis/PNA C. Diff Colitis Pneumoperitoneum -  - Fever resolved and WBC remains high and stable P:   - IV Flagyl, Oral Vancomycin for C. Diff - Vancomycin and Zosyn for PNA, Vancomycin  - Maintain empiric antifungal  ENDOCRINE A:   At risk hyperglycemia  P: - CBG  NEUROLOGIC A:   Encephalopathy - GI driven process abdo likely  -->  Mental retardation  --> Deafness  --> Sedation  --> Normal CT Head  P:   - Will optimize sedation to RASS 0 to -1 - - Fentanyl prn   The patient is critically ill with multiple organ systems failure and requires high complexity decision making for assessment and support, frequent evaluation and titration of therapies, application of advanced monitoring technologies and extensive interpretation of multiple databases.   Critical Care Time devoted to patient care services described in this note is  35  Minutes.   Cyril Mourning MD. Tonny Bollman. [facility] Pager [phone] If no response call [phone]

## 2012-12-14 NOTE — Evaluation (Signed)
Occupational Therapy Evaluation Patient Details Name: Victor Little MRN: [id number] DOB: 1946-01-05 Today's Date: 12/14/2012 Time: 7829-5621 OT Time Calculation (min): 36 min  OT Assessment / Plan / Recommendation History of present illness 67 y.o male With past history of organic brain syndrome, hypertension and is deaf who lives in a group home presented to the emergency room after multiple episodes of diarrhea. In the emergency room, he was noted to have markedly elevated white blood cell count, acute renal failure, hypotension also signs consistent with septic shock. Cultures were sent for C. difficile. Aspirated and developed respiratory failure requiring intubation 12/07/12.   Clinical Impression   Pt admitted with above.  OT was ordered for Lt. UE.  Bil. UEs assessed.  Pt with no active movement noted Bil. UEs or LE's.  Pt with moderate flexor spasticity bil shoulders and elbows, but able to achieve full PROM fairly easily.  Wrists and hands appear flaccid.  All 4 extrems ranged passively.  HR remained in the 90s with no change in response to stimuli.   Pt resided in group home PTA with no caregiver directly involved in care while hospitalized. Although pt demonstrates full PROM, he does demonstrate increased flexor spasticity of bil. UEs and his at high risk for contractures.  He will benefit from skilled OT for ROM to prevent loss of ROM/contracture, and/or injury to joints.  As a contracture, or loss of ROM would likely prove devastating to this pt with h/o deafness and MR.   Disposition to be determined as medical status improves.     OT Assessment  Patient needs continued OT Services    Follow Up Recommendations  Other (comment) (To be determined as medical status improves)    Barriers to Discharge Decreased caregiver support    Equipment Recommendations  Other (comment) (TBD)    Recommendations for Other Services    Frequency  Min 2X/week    Precautions / Restrictions  Precautions Precautions: Other (comment) Precaution Comments: vent - EET; mulitple lines and tubes   Pertinent Vitals/Pain     ADL  ADL Comments: Pt does not follow commands, keeps eyes closed, does not attempt to interact with environment.  PROM performed bil. UEs and bil. LEs - PROM appears WFL all 4 extrems with increased flexor spasticity noted UEs    OT Diagnosis: Cognitive deficits;Paresis  OT Problem List: Decreased range of motion;Impaired UE functional use OT Treatment Interventions: Therapeutic exercise;Patient/family education   OT Goals(Current goals can be found in the care plan section) Acute Rehab OT Goals OT Goal Formulation: Patient unable to participate in goal setting Time For Goal Achievement: 12/28/12 Potential to Achieve Goals: Good ADL Goals Additional ADL Goal #1: Pt will maintain full PROM of bil. UEs as needed for participation in BADLs Additional ADL Goal #2: Caregiver will be independent with PROM  Visit Information  Last OT Received On: 12/14/12 History of Present Illness: 67 y.o male With past history of organic brain syndrome, hypertension and is deaf who lives in a group home presented to the emergency room after multiple episodes of diarrhea. In the emergency room, he was noted to have markedly elevated white blood cell count, acute renal failure, hypotension also signs consistent with septic shock. Cultures were sent for C. difficile. Aspirated and developed respiratory failure requiring intubation 12/07/12.       Prior Functioning     Home Living Family/patient expects to be discharged to:: Group home Prior Function Level of Independence: Needs assistance Comments: Pt resided in group  home.  Per chart review, he was able to interact with staff.  Unsure of his baseline as no caregiver present Communication Communication: Deaf;Other (comment) (ETT tube)         Vision/Perception     Cognition  Cognition Arousal/Alertness:  Lethargic Behavior During Therapy: Flat affect Overall Cognitive Status: Impaired/Different from baseline General Comments: Per RN, pt has not been off sedation at least 24 hours with no signs of purposeful movement  or  attempts to interact with environment except to clench down when attempting to suction    Extremity/Trunk Assessment Upper Extremity Assessment Upper Extremity Assessment: RUE deficits/detail;LUE deficits/detail RUE Deficits / Details: No active movement noted.  Full PROM achieved.  He has mod flexor spasticity elbow and shoulder, but able to move through spasticity fairly easily with slow gentle stretch.  Hand and wrists appear flaccid LUE Deficits / Details: No active movement noted.  Full PROM achieved.  He has mod flexor spasticity elbow and shoulder, but able to move through spasticity fairly easily with slow gentle stretch.  Hand and wrists appear flaccid     Mobility       Exercise     Balance     End of Session OT - End of Session Activity Tolerance: Patient tolerated treatment well Patient left: in bed;with restraints reapplied Nurse Communication: Other (comment) (status of eval)  GO     Jayni Prescher M 12/14/2012, 5:11 PM

## 2012-12-14 NOTE — Progress Notes (Signed)
PARENTERAL NUTRITION CONSULT NOTE - INITIAL  Pharmacy Consult for TNA Indication: NPO  No Known Allergies  Patient Measurements: Height: 6' (182.9 cm) Weight: 184 lb 15.5 oz (83.9 kg) IBW/kg (Calculated) : 77.6 Adjusted Body Weight: 81 kg Weight PTA: 92 kg  Vital Signs: Temp: 100.2 F (37.9 C) (11/29 0800) Temp src: Core (Comment) (11/29 0800) BP: 138/75 mmHg (11/29 0800) Pulse Rate: 75 (11/29 0800) Intake/Output from previous day: 11/28 0701 - 11/29 0700 In: 3742.5 [I.V.:800; IV Piggyback:1412.5; TPN:1530] Out: 7250 [Urine:6550; Emesis/NG output:700] Intake/Output from this shift: Total I/O In: 280 [I.V.:40; IV Piggyback:100; TPN:140] Out: 135 [Urine:135]  Labs:  Recent Labs  12/12/12 0400 12/13/12 0530  WBC 15.2* 15.4*  HGB 12.2* 11.3*  HCT 36.3* 33.1*  PLT 327 281     Recent Labs  12/12/12 0400 12/13/12 0530 12/14/12 0520  NA 141 142 139  K 3.1* 3.4* 3.3*  CL 108 111 100  CO2 21 23 27   GLUCOSE 99 134* 119*  BUN 19 20 20   CREATININE 0.99 1.00 1.11  CALCIUM 7.4* 7.4* 7.6*  MG 1.8 2.1  --   PHOS 3.0 2.9  --   PROT  --  5.3*  --   ALBUMIN  --  1.9*  --   AST  --  17  --   ALT  --  11  --   ALKPHOS  --  59  --   BILITOT  --  0.5  --   PREALBUMIN  --  7.8*  --   TRIG  --  102  --    Estimated Creatinine Clearance: 71.9 ml/min (by C-G formula based on Cr of 1.11).    Recent Labs  12/13/12 2324 12/14/12 0431 12/14/12 0757  GLUCAP 125* 89 133*   Medical History: Past Medical History  Diagnosis Date  . Mental retardation   . Organic brain syndrome   . Hypertension   . Deaf    Medications:  Scheduled:  . antiseptic oral rinse  1 application Mouth Rinse QID  . chlorhexidine  15 mL Mouth/Throat BID  . famotidine (PEPCID) IV  20 mg Intravenous Q24H  . furosemide  40 mg Intravenous Q12H  . insulin aspart  0-9 Units Subcutaneous Q4H  . metronidazole  500 mg Intravenous Q8H  . micafungin (MYCAMINE) IV  100 mg Intravenous Q24H  .  piperacillin-tazobactam (ZOSYN)  IV  3.375 g Intravenous Q8H  . potassium chloride  10 mEq Intravenous Q1 Hr x 4  . vancomycin  500 mg Per Tube Q6H  . vancomycin  1,000 mg Intravenous Q12H   Infusions:  . Marland KitchenTPN (CLINIMIX-E) Adult 60 mL/hr at 12/13/12 1713   And  . fat emulsion 250 mL (12/13/12 1713)   Insulin Requirements in the past 24 hours:  No hx of DM, 3 units novolog  Current Nutrition:  None, NPO since admit 11/20  Assessment: 67 yo with hx HTN, mental retardation, deafness who presented to ER from group home 11/20 s/p 12 hours of vomiting and diarrhea and initial BP of 81/50. Complaint of RLQ pain, found positive for CDiff. Pneumoperitoneum on CT, probable ruptured viscus, ileus vs partial SBO, colitis. Plan per CCS: observe x 24 hr, re-evaluate for need for laparotomy, unless clinical picture worsens. Tube feed not begun with probable GI perf, begin TNA via TL central line placed 11/22.   11/29: Remains on broad spectrum abx/antifungals for ? Perforation and CDiff. Diarrhea improved per surgery notes.  CXR today w/o interval changes, still shows free air.  Holding off on surgery unless clinically deteriorates. For CT abd 11/30  Lytes: K slightly low at 3.3- Kruns x 4 ordered per CCM. Other lytes now WNL. Corr Ca 9.28.    NGT with continued output, suspect this is why the K continues to be low despite repletion  LFT's WNL  Triglycerides: 11/28 = 102  Pre-albumin: 11/28 = 7.8  Propofol OFF 11/25  No IV fluids running  Nutritional Goals/ 24 hr: per RD Kcal: 2070  Protein: 110-125 grams  Fluid: 2.2-2.5 L/day  Goal rate Clinimix E 5/15 of 90 ml/hr, 20% lipids daily at 36ml/hr to deliver 108 gm Protein, 2013 kCal per 24 hr  Plan:  At 1800 today:   Increase Clinimix E 5/15 to 90 ml/hr (goal rate)  Follow-up AM bmet  Lipids 20% at 10 ml/hr daily  TNA labs Mon, Thursday  MVI & trace elements daily  Continue Novolog scale sensitive q4 hr   Thank you for the  consult.  Tomi Bamberger, PharmD, BCPS Clinical Pharmacist Pager: [phone] Pharmacy: [phone] 12/14/2012 10:14 AM

## 2012-12-14 NOTE — Progress Notes (Signed)
Patient ID: Victor Little, male   DOB: [birth year], 67 y.o.   MRN: [id number]  General Surgery - [facility] - Progress Note  Subjective: Patient in ICU on vent.  No progress with weaning.  Two loose BM's overnight - slowing per nurse.  Objective: Vital signs in last 24 hours: Temp:  [99.9 F (37.7 C)-100.2 F (37.9 C)] 100.2 F (37.9 C) (11/29 0800) Pulse Rate:  [73-104] 75 (11/29 0800) Resp:  [16-21] 16 (11/29 0800) BP: (133-158)/(73-84) 138/75 mmHg (11/29 0800) SpO2:  [94 %-98 %] 98 % (11/29 0800) FiO2 (%):  [40 %] 40 % (11/29 0951) Weight:  [184 lb 15.5 oz (83.9 kg)] 184 lb 15.5 oz (83.9 kg) (11/29 0400) Last BM Date: 12/14/12  Intake/Output from previous day: 11/28 0701 - 11/29 0700 In: 3742.5 [I.V.:800; IV Piggyback:1412.5; TPN:1530] Out: 7250 [Urine:6550; Emesis/NG output:700]  Exam: HEENT - clear, not icteric Neck - soft Chest - coarse bilaterally Cor - RRR, no murmur Abd - softer, no distension; BS present; mild (?) tenderness suprapubic region without guarding Ext - no significant edema  Lab Results:   Recent Labs  12/12/12 0400 12/13/12 0530  WBC 15.2* 15.4*  HGB 12.2* 11.3*  HCT 36.3* 33.1*  PLT 327 281     Recent Labs  12/13/12 0530 12/14/12 0520  NA 142 139  K 3.4* 3.3*  CL 111 100  CO2 23 27  GLUCOSE 134* 119*  BUN 20 20  CREATININE 1.00 1.11  CALCIUM 7.4* 7.6*    Studies/Results: Dg Chest Port 1 View  12/13/2012   CLINICAL DATA:  Check endotracheal tube position  EXAM: PORTABLE CHEST - 1 VIEW  COMPARISON:  12/10/2012  FINDINGS: Endotracheal tube is again identified 6.6 cm above the carina in satisfactory position. A left jugular central line is noted with the tip in the proximal superior vena cava. Nasogastric catheter is seen within the stomach. Cardiac shadow is stable. Persistent bibasilar changes are noted right greater than left no new focal abnormality is noted.  IMPRESSION: No significant change from this prior  exam. The endotracheal tube and left jugular line are in satisfactory position.   Electronically Signed   By: Alcide Clever M.D.   On: 12/13/2012 07:09    Assessment / Plan: 1.  Cdiff colitis with free intraperitoneal air  Clinical exam slightly improved  Remains stable but low grade fever this morning  Will re-evaluate in AM 11/30 - if no clear progress, will repeat CT abdomen tomorrow to evaluate quantity of free air remaining in abdomen and assess lower abdomen for acute inflammatory process  Will follow closely  Per CCM  Velora Heckler, MD, [facility] Office: [phone]  12/14/2012

## 2012-12-15 ENCOUNTER — Inpatient Hospital Stay (HOSPITAL_COMMUNITY): Payer: PRIVATE HEALTH INSURANCE

## 2012-12-15 ENCOUNTER — Encounter (HOSPITAL_COMMUNITY): Payer: Self-pay | Admitting: Radiology

## 2012-12-15 LAB — GLUCOSE, CAPILLARY
Glucose-Capillary: 104 mg/dL — ABNORMAL HIGH (ref 70–99)
Glucose-Capillary: 124 mg/dL — ABNORMAL HIGH (ref 70–99)
Glucose-Capillary: 126 mg/dL — ABNORMAL HIGH (ref 70–99)

## 2012-12-15 LAB — VANCOMYCIN, TROUGH: Vancomycin Tr: 18 ug/mL (ref 10.0–20.0)

## 2012-12-15 LAB — CBC
Hemoglobin: 12.8 g/dL — ABNORMAL LOW (ref 13.0–17.0)
MCH: 28.9 pg (ref 26.0–34.0)
MCHC: 34.5 g/dL (ref 30.0–36.0)
RBC: 4.43 MIL/uL (ref 4.22–5.81)
RDW: 14 % (ref 11.5–15.5)

## 2012-12-15 LAB — BASIC METABOLIC PANEL
BUN: 26 mg/dL — ABNORMAL HIGH (ref 6–23)
Calcium: 7.7 mg/dL — ABNORMAL LOW (ref 8.4–10.5)
Creatinine, Ser: 1.14 mg/dL (ref 0.50–1.35)
GFR calc non Af Amer: 65 mL/min — ABNORMAL LOW (ref >90)
Glucose, Bld: 123 mg/dL — ABNORMAL HIGH (ref 70–99)
Potassium: 3.3 mEq/L — ABNORMAL LOW (ref 3.5–5.1)

## 2012-12-15 MED ORDER — TRACE MINERALS CR-CU-F-FE-I-MN-MO-SE-ZN IV SOLN
INTRAVENOUS | Status: AC
  Administered 2012-12-15: 17:00:00 via INTRAVENOUS
  Filled 2012-12-15: qty 2160

## 2012-12-15 MED ORDER — POTASSIUM CHLORIDE 10 MEQ/50ML IV SOLN
10.0000 meq | INTRAVENOUS | Status: AC
  Administered 2012-12-15 (×2): 10 meq via INTRAVENOUS
  Filled 2012-12-15 (×2): qty 50

## 2012-12-15 MED ORDER — POTASSIUM CHLORIDE 10 MEQ/50ML IV SOLN
10.0000 meq | INTRAVENOUS | Status: AC
  Administered 2012-12-15 (×4): 10 meq via INTRAVENOUS
  Filled 2012-12-15 (×4): qty 50

## 2012-12-15 MED ORDER — FAT EMULSION 20 % IV EMUL
250.0000 mL | INTRAVENOUS | Status: AC
  Administered 2012-12-15: 250 mL via INTRAVENOUS
  Filled 2012-12-15: qty 250

## 2012-12-15 MED ORDER — IOHEXOL 300 MG/ML  SOLN
100.0000 mL | Freq: Once | INTRAMUSCULAR | Status: AC | PRN
  Administered 2012-12-15: 100 mL via INTRAVENOUS

## 2012-12-15 MED ORDER — IOHEXOL 300 MG/ML  SOLN
50.0000 mL | Freq: Once | INTRAMUSCULAR | Status: AC | PRN
  Administered 2012-12-15: 50 mL via ORAL

## 2012-12-15 NOTE — Progress Notes (Signed)
2 RT's present for ETT holder change- uneventful. RN aware. 

## 2012-12-15 NOTE — Progress Notes (Signed)
PT placed on FS at 100% 02 for CT transport.

## 2012-12-15 NOTE — Progress Notes (Signed)
PARENTERAL NUTRITION CONSULT NOTE - Follow-up  Pharmacy Consult for TNA Indication: NPO  No Known Allergies  Patient Measurements: Height: 6' (182.9 cm) Weight: 171 lb 11.8 oz (77.9 kg) IBW/kg (Calculated) : 77.6 Adjusted Body Weight: 81 kg Weight PTA: 92 kg  Vital Signs: Temp: 100.2 F (37.9 C) (11/30 0800) BP: 100/61 mmHg (11/30 0800) Pulse Rate: 74 (11/30 0800) Intake/Output from previous day: 11/29 0701 - 11/30 0700 In: 4047.5 [I.V.:640; IV Piggyback:1237.5; TPN:2170] Out: 4780 [Urine:3030; Emesis/NG output:1750] Intake/Output from this shift: Total I/O In: 400 [IV Piggyback:200; TPN:200] Out: 340 [Urine:190; Emesis/NG output:150]  Labs:  Recent Labs  12/13/12 0530 12/15/12 0520  WBC 15.4* 16.3*  HGB 11.3* 12.8*  HCT 33.1* 37.1*  PLT 281 356     Recent Labs  12/13/12 0530 12/14/12 0520 12/15/12 0520  NA 142 139 138  K 3.4* 3.3* 3.3*  CL 111 100 98  CO2 23 27 27   GLUCOSE 134* 119* 123*  BUN 20 20 26*  CREATININE 1.00 1.11 1.14  CALCIUM 7.4* 7.6* 7.7*  MG 2.1  --   --   PHOS 2.9  --   --   PROT 5.3*  --   --   ALBUMIN 1.9*  --   --   AST 17  --   --   ALT 11  --   --   ALKPHOS 59  --   --   BILITOT 0.5  --   --   PREALBUMIN 7.8*  --   --   TRIG 102  --   --    Estimated Creatinine Clearance: 70 ml/min (by C-G formula based on Cr of 1.14).    Recent Labs  12/14/12 2026 12/15/12 0022 12/15/12 0755  GLUCAP 129* 124* 135*   Medical History: Past Medical History  Diagnosis Date  . Mental retardation   . Organic brain syndrome   . Hypertension   . Deaf    Medications:  Scheduled:  . antiseptic oral rinse  1 application Mouth Rinse QID  . chlorhexidine  15 mL Mouth/Throat BID  . famotidine (PEPCID) IV  20 mg Intravenous Q24H  . insulin aspart  0-9 Units Subcutaneous Q4H  . metronidazole  500 mg Intravenous Q8H  . micafungin (MYCAMINE) IV  100 mg Intravenous Q24H  . piperacillin-tazobactam (ZOSYN)  IV  3.375 g Intravenous Q8H  .  vancomycin  500 mg Per Tube Q6H  . vancomycin  1,000 mg Intravenous Q12H   Infusions:  . Marland KitchenTPN (CLINIMIX-E) Adult 90 mL/hr at 12/14/12 1732   And  . fat emulsion 250 mL (12/14/12 1732)   Insulin Requirements in the past 24 hours:  No hx of DM, 4 units Novolog, CBGs 124-135  Current Nutrition:  None, NPO since admit 11/20  Assessment: 67 yo with hx HTN, mental retardation, deafness who presented to ER from group home 11/20 s/p 12 hours of vomiting and diarrhea and initial BP of 81/50. Complaint of RLQ pain, found positive for CDiff. Pneumoperitoneum on CT, probable ruptured viscus, ileus vs partial SBO, colitis. Plan per CCS: observe x 24 hr, re-evaluate for need for laparotomy, unless clinical picture worsens. Tube feed not begun with probable GI perf, begin TNA via TL central line placed 11/22.   11/30: Remains on broad spectrum abx/antifungals for ? Perforation and CDiff. Pt continues in ICU on ventilator and has not been able to wean. No diarrhea per surgery notes.  CT adb and pelvis with contrast scheduled for today.  Lytes: K slightly low at  3.3- Kruns x 2 given this AM. Other lytes remain WNL. Corr Ca 9.38.    NGT with continued output, suspect this is why the K continues to be low despite repletion  Slightly negative fluid balance (-1.4L) since admission  LFT's WNL  Triglycerides: 11/28 = 102  Pre-albumin: 11/28 = 7.8  Propofol OFF 11/25  No IV fluids running  Nutritional Goals/ 24 hr: per RD Kcal: 2070  Protein: 110-125 grams  Fluid: 2.2-2.5 L/day  Goal rate Clinimix E 5/15 of 90 ml/hr, 20% lipids daily at 1ml/hr to deliver 108 gm Protein, 2013 kCal per 24 hr  Plan:  At 1800 today:   Continue Clinimix E 5/15 at 90 ml/hr (goal rate)  Lipids 20% at 10 ml/hr daily  KCl 10 mEq runs x 4 for a total of 60 mEq today  in addition to KCl in TNA  TNA labs Mon, Thursday  MVI & trace elements daily  Continue Novolog scale sensitive q4 hr   Thank you for the  consult.  Tomi Bamberger, PharmD, BCPS Clinical Pharmacist Pager: [phone] Pharmacy: [phone] 12/15/2012 10:23 AM

## 2012-12-15 NOTE — Progress Notes (Signed)
Patient ID: Victor Little, male   DOB: 12-05-1945, 67 y.o.   MRN: [id number]  General Surgery - [facility] - Progress Note  Subjective: Patient essentially unchanged in ICU on vent.  Minimal interaction.  No diarrheal stools overnight per nurse.  Not weaning.  Low grade fevers.  Objective: Vital signs in last 24 hours: Temp:  [99.9 F (37.7 C)-100.8 F (38.2 C)] 99.9 F (37.7 C) (11/30 0600) Pulse Rate:  [64-101] 71 (11/30 0600) Resp:  [16-29] 20 (11/30 0600) BP: (106-146)/(65-103) 108/65 mmHg (11/30 0600) SpO2:  [91 %-99 %] 97 % (11/30 0600) FiO2 (%):  [30 %-40 %] 30 % (11/30 0600) Weight:  [171 lb 11.8 oz (77.9 kg)] 171 lb 11.8 oz (77.9 kg) (11/30 0500) Last BM Date: 12/14/12  Intake/Output from previous day: 11/29 0701 - 11/30 0700 In: 3947.5 [I.V.:640; IV Piggyback:1237.5; TPN:2070] Out: 4780 [Urine:3030; Emesis/NG output:1750]  Exam: HEENT - clear, not icteric Neck - soft Chest - coarse bilaterally Cor - RRR, rate 70's Abd - soft with minimal distension; questionable "mass" suprapubic, mild (?) tenderness lower abdomen Ext - no significant edema  Lab Results:   Recent Labs  12/13/12 0530 12/15/12 0520  WBC 15.4* 16.3*  HGB 11.3* 12.8*  HCT 33.1* 37.1*  PLT 281 356     Recent Labs  12/14/12 0520 12/15/12 0520  NA 139 138  K 3.3* 3.3*  CL 100 98  CO2 27 27  GLUCOSE 119* 123*  BUN 20 26*  CREATININE 1.11 1.14  CALCIUM 7.6* 7.7*    Studies/Results: Dg Chest Port 1 View  12/15/2012   CLINICAL DATA:  Evaluate pneumonia, air within the abdomen  EXAM: PORTABLE CHEST - 1 VIEW  COMPARISON:  12/13/2012; 12/10/2012; chest CT -12/11/2012  FINDINGS: Grossly unchanged cardiac silhouette and mediastinal contours given patient rotation. Stable positioning of support apparatus. Overall improved aeration of the lungs with persistent ill-defined heterogeneous opacities within the right mid and lower lung. There is a minimal amount of fluid within  the right minor fissure. No definite evidence of edema appear No definite pleural effusion or pneumothorax. Unchanged bones.  The amount of pneumoperitoneum within the imaged upper abdomen is unchanged to minimally decreased in the interval.  IMPRESSION: 1.  Stable positioning of support apparatus.  No pneumothorax. 2. Improved aeration of the lungs with persistent right mid lung opacities, likely residual atelectasis. 3. Persistent findings of pneumoperitoneum within the imaged upper abdomen, unchanged to minimally decreased in the interval.   Electronically Signed   By: Simonne Come M.D.   On: 12/15/2012 08:19    Assessment / Plan: Cdiff Colitis with pneumoperitoneum  Diarrhea improved with oral and IV Vanco  Clinical exam stable  WBC rising slightly  Low grade fever  Not progressing with wean, depressed mental status  Will proceed with CT scan of abdomen and pelvis today.  Will evaluate pneumoperitoneum and rule out perforation with oral contrast.  Will evaluate lower abdominal inflammatory process and rule out abscess.  Will follow closely with you.  Velora Heckler, MD, [facility] Office: [phone]  12/15/2012

## 2012-12-15 NOTE — Progress Notes (Signed)
ANTIBIOTIC CONSULT NOTE - Follow up  Pharmacy Consult for Zosyn, Vanc IV Indication: rule out sepsis, aspiration PNA  No Known Allergies  Patient Measurements: Height: 6' (182.9 cm) Weight: 171 lb 11.8 oz (77.9 kg) IBW/kg (Calculated) : 77.6  Vital Signs: Temp: 99.5 F (37.5 C) (11/30 1204) BP: 129/78 mmHg (11/30 1204) Pulse Rate: 65 (11/30 1204) Intake/Output from previous day: 11/29 0701 - 11/30 0700 In: 4047.5 [I.V.:640; IV Piggyback:1237.5; TPN:2170] Out: 4780 [Urine:3030; Emesis/NG output:1750] Intake/Output from this shift: Total I/O In: 750 [IV Piggyback:250; TPN:500] Out: 495 [Urine:345; Emesis/NG output:150]  Labs:  Recent Labs  12/13/12 0530 12/14/12 0520 12/15/12 0520  WBC 15.4*  --  16.3*  HGB 11.3*  --  12.8*  PLT 281  --  356  CREATININE 1.00 1.11 1.14   Estimated Creatinine Clearance: 70 ml/min (by C-G formula based on Cr of 1.14).  Recent Labs  12/12/12 2300 12/15/12 1200  VANCOTROUGH 24.6* 18.0      Assessment: 67 yo with hx HTN, mental retardation, deafness admitted 11/20 from group home with 12 hours of vomiting and diarrhea and initial BP of 81/50. Patient also with elevated lactic acid and procalcitonin on admission. Pharmacy was consulted to dose broad spectrum abx's for possible sepsis/aspiration pna. Pt also C.diff positive.   Today is D11 Zosyn Extended Infusion / D6 Vancomycin 1g IV q 12 h / D5 Micafungin 100mg  IV q 24 h for sepsis/asp PNA/pneumoperitoneum/?intraobd infx. Empiric Vancomycin/Micafungin re-added for fever 11/25, leukocytosis. D11 Vanc/Flagyl for Cdiff  Abd CT performed today shows decreased volume of pneumoperitoneum, development of small focal abscess, decreasing distal colonic and rectal wall thickening suggesting improved C. Diff colitis, and no change in mass or fluid collection in left aspect of proximal rectum  Antiinfectives: 11/20 >> Vanc IV >> 11/20 11/20 >> Vanc PR >> 11/26 11/20 >> Flagyl (Cdiff) >> 11/24  >> Vanc VT (Cdiff) >> 11/20 >> Zosyn (PNA) >> 11/25 >> Vanc IV (PNA) >> 11/26 >> Micafungin (PNA)>>   Tmax: 100.8 WBCs: increased slightly to 16.3 Renal: SCr 1.11, essentially stable, CrCl 70CG, 66N, UOP great at 1.9/kg/h PCT: 5.28 -> 1.61(11/22)  Microbiology: 11/20 blood: NG F 11/20 urine: NG F  11/20 stool: collected 11/20 CDiff PCR: (+) 11/20 MRSA PCR: (-) 11/25 blood: ngtd  Levels/Doses VT 11/27 at 2330 = 24.6 on 1250 Q12h VT 11/30 at 1200 = 18.0 on 1g q12h, cont current dosing   Goal of Therapy:  Eradication of infection Vanc trough 15-77mcg/ml  Plan:  - continue vancomycin 1g IV q12h - continue Zosyn 3.375g IV q8h, each dose infused over 4 hours - repeat vancomycin trough if indicated - follow-up clinical course, culture results, renal function - follow-up antibiotic de-escalation and length of therapy - follow-up any plans for surgery  Thank you for the consult.  Tomi Bamberger, PharmD, BCPS Clinical Pharmacist Pager: [phone] Pharmacy: [phone] 12/15/2012 1:22 PM

## 2012-12-15 NOTE — Progress Notes (Signed)
PT transported back to [facility] 1231 from CT- uneventful. PTis back on original vent settings.

## 2012-12-15 NOTE — Progress Notes (Signed)
PULMONARY  / CRITICAL CARE MEDICINE  Name: Victor Little MRN: [id number] DOB: July 04, 1945    ADMISSION DATE:  12/05/2012 CONSULTATION DATE:  12/07/12  REFERRING MD :  Penny Pia, MD PRIMARY SERVICE: PCCM  CHIEF COMPLAINT:  Acute respiratory failure.   BRIEF PATIENT DESCRIPTION:  67 years old male resident of a group home with PMH relevant for mental retardation, deafness, HTN. Admid cdiff, aspiration, vent.  SIGNIFICANT EVENTS / STUDIES:  11/22:  Intubation 11/24:  Remains vented, weaning 11/26   ct head>>> no acute 11/26  CT chest aspiration infil rt, small effusions, free air 11/26:  CT abdomen with free air in abdomen, no pressors 11/28:  CXR without interval changes, free air  LINES / TUBES: Lef IJ CVC 12/07/12>>> ETT 11/22 >>> 1126  CULTURES: 11/20:  Blood culture >>> NGTD 11/20:  MRSA PCR >>> Negative 11/21:  Urine culture >>> Negative, Final 11/20:  C Diff PCR - Positive  ANTIBIOTICS: Zosyn:    11/20 >>> IV Flagyl:    11/20 >>> Rectal vancomycin:   11/20 >>> 11/26 Vancomycin PO 11/24 >>> Vancomycin IV 11/25 >>> Micafungin  11/26 >>>  SUBJECTIVE/OVERNIGHT/INTERVAL HX  Afebrile Remains on vent  Deaf -doe not spont opens eyes Good UO with lasix  VITAL SIGNS: Temp:  [99.9 F (37.7 C)-100.8 F (38.2 C)] 100.2 F (37.9 C) (11/30 0800) Pulse Rate:  [64-101] 74 (11/30 0800) Resp:  [16-29] 16 (11/30 0800) BP: (100-146)/(61-103) 100/61 mmHg (11/30 0800) SpO2:  [91 %-99 %] 96 % (11/30 0800) FiO2 (%):  [30 %-40 %] 30 % (11/30 1058) Weight:  [77.9 kg (171 lb 11.8 oz)] 77.9 kg (171 lb 11.8 oz) (11/30 0500) HEMODYNAMICS:   VENTILATOR SETTINGS: Vent Mode:  [-] CPAP;PSV FiO2 (%):  [30 %-40 %] 30 % Set Rate:  [16 bmp] 16 bmp Vt Set:  [20 mL-620 mL] 620 mL PEEP:  [5 cmH20] 5 cmH20 Pressure Support:  [10 cmH20] 10 cmH20 Plateau Pressure:  [11 cmH20-19 cmH20] 11 cmH20 INTAKE / OUTPUT: Intake/Output     11/29 0701 - 11/30 0700 11/30 0701 - 12/01 0700    I.V. (mL/kg) 640 (8.2)    IV Piggyback 1237.5 200   TPN 2170 200   Total Intake(mL/kg) 4047.5 (52) 400 (5.1)   Urine (mL/kg/hr) 3030 (1.6) 190 (0.6)   Emesis/NG output 1750 (0.9) 150 (0.4)   Total Output 4780 340   Net -732.5 +60        Stool Occurrence 2 x      PHYSICAL EXAMINATION: General: Vented, deafness Neuro: Vented , RASS at 0 to -1, continue to resist eyelids retraction,localises to painful stimuli HEENT: NCAT, eye close mostly, no discharge    ET tube in mouth PULM:  Coarse breath sound bilaterally, no wheeze CV: RRR, s1 and s2 heard, no murmur/rub/gallop GI: Soft,non tender, (+) BS, Extremities: generalized swelling, no pitting edema, equal pulses, warm to touch  LABS: PULMONARY  Recent Labs Lab 12/10/12 0432  PHART 7.411  PCO2ART 31.4*  PO2ART 132.0*  HCO3 19.2*  TCO2 17.2  O2SAT 98.5    CBC  Recent Labs Lab 12/12/12 0400 12/13/12 0530 12/15/12 0520  HGB 12.2* 11.3* 12.8*  HCT 36.3* 33.1* 37.1*  WBC 15.2* 15.4* 16.3*  PLT 327 281 356    COAGULATION No results found for this basename: INR,  in the last 168 hours  CARDIAC  No results found for this basename: TROPONINI,  in the last 168 hours No results found for this basename: PROBNP,  in the  last 168 hours   CHEMISTRY  Recent Labs Lab 12/09/12 0530 12/10/12 0554 12/11/12 0532 12/12/12 0400 12/13/12 0530 12/14/12 0520 12/15/12 0520  NA 143 142 143 141 142 139 138  K 4.0 3.7 3.6 3.1* 3.4* 3.3* 3.3*  CL 115* 111 111 108 111 100 98  CO2 21 20 20 21 23 27 27   GLUCOSE 95 99 107* 99 134* 119* 123*  BUN 15 13 17 19 20 20  26*  CREATININE 1.15 1.03 0.99 0.99 1.00 1.11 1.14  CALCIUM 7.2* 7.5* 7.5* 7.4* 7.4* 7.6* 7.7*  MG 1.9 2.0 2.0 1.8 2.1  --   --   PHOS 2.1* 3.2 3.1 3.0 2.9  --   --    Estimated Creatinine Clearance: 70 ml/min (by C-G formula based on Cr of 1.14).   LIVER  Recent Labs Lab 12/10/12 0554 12/13/12 0530  AST 25 17  ALT 13 11  ALKPHOS 66 59  BILITOT 0.7 0.5   PROT 5.4* 5.3*  ALBUMIN 2.2* 1.9*     INFECTIOUS No results found for this basename: LATICACIDVEN, PROCALCITON,  in the last 168 hours   ENDOCRINE CBG (last 3)   Recent Labs  12/14/12 2026 12/15/12 0022 12/15/12 0755  GLUCAP 129* 124* 135*     IMAGING x48h  Dg Chest Port 1 View  12/15/2012   CLINICAL DATA:  Evaluate pneumonia, air within the abdomen  EXAM: PORTABLE CHEST - 1 VIEW  COMPARISON:  12/13/2012; 12/10/2012; chest CT -12/11/2012  FINDINGS: Grossly unchanged cardiac silhouette and mediastinal contours given patient rotation. Stable positioning of support apparatus. Overall improved aeration of the lungs with persistent ill-defined heterogeneous opacities within the right mid and lower lung. There is a minimal amount of fluid within the right minor fissure. No definite evidence of edema appear No definite pleural effusion or pneumothorax. Unchanged bones.  The amount of pneumoperitoneum within the imaged upper abdomen is unchanged to minimally decreased in the interval.  IMPRESSION: 1.  Stable positioning of support apparatus.  No pneumothorax. 2. Improved aeration of the lungs with persistent right mid lung opacities, likely residual atelectasis. 3. Persistent findings of pneumoperitoneum within the imaged upper abdomen, unchanged to minimally decreased in the interval.   Electronically Signed   By: Simonne Come M.D.   On: 12/15/2012 08:19   ASSESSMENT / PLAN:  PULMONARY A: Acute hypoxemic respiratory failure Aspiration PNA  P:   - SBts -tolerates Ps 5/5  -some cheyne stokes' respn -with goal extubation when awake -CXR -improved slight rt base on pcxr  CARDIOVASCULAR A:  Hypertension - Adequate control  P:  - KVO IFV though net positive still - prn IV Metoprolol - Ct Lasix for negative balance  RENAL A:   Acute Renal Failure - Resolved Lactic Acidosis - Resolved Volume Overload Hypokalemia  P:    - Potassium low, will replace  GASTROINTESTINAL A:    C. Diff colitis SBO vs ileus - Resolving Pneumoperitoneum on CT 11/26  --> Surgery following  P:   - PPI change to H2 antogonist in C. Diff setting - Tube feeding held  - Continue Flagyl and Oral Vancomycin, however may need to dc all orals -  Ct TPN -CT abdomen planned  HEMATOLOGIC A:   Leukocytosis - Stable  Secondary to C. Diff infection and now questionable intraabdominal abcess Fever P:  -- Will follow CBC, PRBC for hgb < 7  INFECTIOUS A:   Aspiration pneumonitis/PNA C. Diff Colitis Pneumoperitoneum -  - Fever resolved and WBC remains high  and stable P:   - IV Flagyl, Oral Vancomycin for C. Diff - Vancomycin and Zosyn for PNA, Vancomycin  - Maintain empiric antifungal  ENDOCRINE A:   At risk hyperglycemia  P: - CBG  NEUROLOGIC A:   Encephalopathy - GI driven process abdo likely  --> Mental retardation  --> Deafness  --> Sedation  --> Normal CT Head  P:   - Will optimize sedation to RASS 0 - - Fentanyl prn   The patient is critically ill with multiple organ systems failure and requires high complexity decision making for assessment and support, frequent evaluation and titration of therapies, application of advanced monitoring technologies and extensive interpretation of multiple databases.   Critical Care Time devoted to patient care services described in this note is  32  Minutes.   Cyril Mourning MD. Tonny Bollman. [facility] Pager [phone] If no response call [phone]

## 2012-12-16 LAB — CULTURE, BLOOD (ROUTINE X 2)
Culture: NO GROWTH
Culture: NO GROWTH

## 2012-12-16 LAB — TRIGLYCERIDES: Triglycerides: 95 mg/dL (ref <150)

## 2012-12-16 LAB — COMPREHENSIVE METABOLIC PANEL
ALT: 14 U/L (ref 0–53)
Alkaline Phosphatase: 71 U/L (ref 39–117)
BUN: 27 mg/dL — ABNORMAL HIGH (ref 6–23)
CO2: 25 mEq/L (ref 19–32)
Creatinine, Ser: 0.98 mg/dL (ref 0.50–1.35)
GFR calc Af Amer: >90 mL/min (ref >90)
GFR calc non Af Amer: 84 mL/min — ABNORMAL LOW (ref >90)
Glucose, Bld: 115 mg/dL — ABNORMAL HIGH (ref 70–99)
Potassium: 4.4 mEq/L (ref 3.5–5.1)
Sodium: 131 mEq/L — ABNORMAL LOW (ref 135–145)
Total Protein: 6.4 g/dL (ref 6.0–8.3)

## 2012-12-16 LAB — DIFFERENTIAL
Eosinophils Absolute: 0.5 10*3/uL (ref 0.0–0.7)
Eosinophils Relative: 3 % (ref 0–5)
Lymphs Abs: 1.7 10*3/uL (ref 0.7–4.0)
Monocytes Absolute: 1.3 10*3/uL — ABNORMAL HIGH (ref 0.1–1.0)
Neutrophils Relative %: 79 % — ABNORMAL HIGH (ref 43–77)

## 2012-12-16 LAB — PREALBUMIN: Prealbumin: 17.5 mg/dL — ABNORMAL LOW (ref 17.0–34.0)

## 2012-12-16 LAB — GLUCOSE, CAPILLARY
Glucose-Capillary: 116 mg/dL — ABNORMAL HIGH (ref 70–99)
Glucose-Capillary: 117 mg/dL — ABNORMAL HIGH (ref 70–99)
Glucose-Capillary: 119 mg/dL — ABNORMAL HIGH (ref 70–99)
Glucose-Capillary: 115 mg/dL — ABNORMAL HIGH (ref 70–99)

## 2012-12-16 LAB — CBC
MCH: 28.9 pg (ref 26.0–34.0)
MCHC: 34.8 g/dL (ref 30.0–36.0)
MCV: 83.1 fL (ref 78.0–100.0)
Platelets: 405 10*3/uL — ABNORMAL HIGH (ref 150–400)
RBC: 4.39 MIL/uL (ref 4.22–5.81)

## 2012-12-16 MED ORDER — FUROSEMIDE 10 MG/ML IJ SOLN
40.0000 mg | Freq: Once | INTRAMUSCULAR | Status: AC
  Administered 2012-12-16: 40 mg via INTRAVENOUS
  Filled 2012-12-16: qty 4

## 2012-12-16 MED ORDER — TRACE MINERALS CR-CU-F-FE-I-MN-MO-SE-ZN IV SOLN
INTRAVENOUS | Status: AC
  Administered 2012-12-16: 18:00:00 via INTRAVENOUS
  Filled 2012-12-16: qty 2280

## 2012-12-16 MED ORDER — FAT EMULSION 20 % IV EMUL
250.0000 mL | INTRAVENOUS | Status: AC
  Filled 2012-12-16: qty 250

## 2012-12-16 NOTE — Progress Notes (Signed)
Patient ID: Victor Little, male   DOB: [birth year], 67 y.o.   MRN: [id number] [facility] Progress Note:   * No surgery found *  Subjective: Mental status is sedated on vent Objective: Vital signs in last 24 hours: Temp:  [98.8 F (37.1 C)-99.9 F (37.7 C)] 99.3 F (37.4 C) (12/01 1100) Pulse Rate:  [55-85] 69 (12/01 1100) Resp:  [12-28] 22 (12/01 1100) BP: (114-153)/(68-90) 139/80 mmHg (12/01 1000) SpO2:  [95 %-100 %] 97 % (12/01 1100) FiO2 (%):  [30 %] 30 % (12/01 0835) Weight:  [175 lb 11.3 oz (79.7 kg)] 175 lb 11.3 oz (79.7 kg) (12/01 0500)  Intake/Output from previous day: 11/30 0701 - 12/01 0700 In: 4170 [I.V.:520; IV Piggyback:1250; TPN:2400] Out: 2850 [Urine:1700; Emesis/NG output:1150] Intake/Output this shift: Total I/O In: 590 [I.V.:120; NG/GT:50; IV Piggyback:20; TPN:400] Out: 835 [Urine:435; Emesis/NG output:400]  Physical Exam: Work of breathing is vent controlled.  Pulse 80.  Abdomen is soft and not distended  Lab Results:  Results for orders placed during the hospital encounter of 12/05/12 (from the past 48 hour(s))  GLUCOSE, CAPILLARY     Status: Abnormal   Collection Time    12/14/12 12:29 PM      Result Value Range   Glucose-Capillary 140 (*) 70 - 99 mg/dL   Comment 1 Documented in Chart     Comment 2 Notify RN    GLUCOSE, CAPILLARY     Status: Abnormal   Collection Time    12/14/12  3:49 PM      Result Value Range   Glucose-Capillary 127 (*) 70 - 99 mg/dL   Comment 1 Documented in Chart     Comment 2 Notify RN    GLUCOSE, CAPILLARY     Status: Abnormal   Collection Time    12/14/12  8:26 PM      Result Value Range   Glucose-Capillary 129 (*) 70 - 99 mg/dL  GLUCOSE, CAPILLARY     Status: Abnormal   Collection Time    12/15/12 12:22 AM      Result Value Range   Glucose-Capillary 124 (*) 70 - 99 mg/dL   Comment 1 Notify RN    GLUCOSE, CAPILLARY     Status: Abnormal   Collection Time    12/15/12  3:57 AM      Result Value Range   Glucose-Capillary 126 (*) 70 - 99 mg/dL   Comment 1 Notify RN    BASIC METABOLIC PANEL     Status: Abnormal   Collection Time    12/15/12  5:20 AM      Result Value Range   Sodium 138  135 - 145 mEq/L   Potassium 3.3 (*) 3.5 - 5.1 mEq/L   Chloride 98  96 - 112 mEq/L   CO2 27  19 - 32 mEq/L   Glucose, Bld 123 (*) 70 - 99 mg/dL   BUN 26 (*) 6 - 23 mg/dL   Creatinine, Ser 7.25  0.50 - 1.35 mg/dL   Calcium 7.7 (*) 8.4 - 10.5 mg/dL   GFR calc non Af Amer 65 (*) >90 mL/min   GFR calc Af Amer 76 (*) >90 mL/min   Comment: (NOTE)     The eGFR has been calculated using the CKD EPI equation.     This calculation has not been validated in all clinical situations.     eGFR's persistently <90 mL/min signify possible Chronic Kidney     Disease.  CBC     Status:  Abnormal   Collection Time    12/15/12  5:20 AM      Result Value Range   WBC 16.3 (*) 4.0 - 10.5 K/uL   RBC 4.43  4.22 - 5.81 MIL/uL   Hemoglobin 12.8 (*) 13.0 - 17.0 g/dL   HCT 91.4 (*) 78.2 - 95.6 %   MCV 83.7  78.0 - 100.0 fL   MCH 28.9  26.0 - 34.0 pg   MCHC 34.5  30.0 - 36.0 g/dL   RDW 21.3  08.6 - 57.8 %   Platelets 356  150 - 400 K/uL  GLUCOSE, CAPILLARY     Status: Abnormal   Collection Time    12/15/12  7:55 AM      Result Value Range   Glucose-Capillary 135 (*) 70 - 99 mg/dL   Comment 1 Documented in Chart     Comment 2 Notify RN    VANCOMYCIN, TROUGH     Status: None   Collection Time    12/15/12 12:00 PM      Result Value Range   Vancomycin Tr 18.0  10.0 - 20.0 ug/mL  GLUCOSE, CAPILLARY     Status: Abnormal   Collection Time    12/15/12 12:23 PM      Result Value Range   Glucose-Capillary 120 (*) 70 - 99 mg/dL   Comment 1 Documented in Chart     Comment 2 Notify RN    GLUCOSE, CAPILLARY     Status: Abnormal   Collection Time    12/15/12  5:07 PM      Result Value Range   Glucose-Capillary 104 (*) 70 - 99 mg/dL  GLUCOSE, CAPILLARY     Status: Abnormal   Collection Time    12/15/12  7:31 PM      Result  Value Range   Glucose-Capillary 117 (*) 70 - 99 mg/dL   Comment 1 Notify RN    GLUCOSE, CAPILLARY     Status: Abnormal   Collection Time    12/15/12 11:37 PM      Result Value Range   Glucose-Capillary 116 (*) 70 - 99 mg/dL  GLUCOSE, CAPILLARY     Status: Abnormal   Collection Time    12/16/12  3:21 AM      Result Value Range   Glucose-Capillary 119 (*) 70 - 99 mg/dL  COMPREHENSIVE METABOLIC PANEL     Status: Abnormal   Collection Time    12/16/12  6:28 AM      Result Value Range   Sodium 131 (*) 135 - 145 mEq/L   Comment: DELTA CHECK NOTED     REPEATED TO VERIFY   Potassium 4.4  3.5 - 5.1 mEq/L   Comment: DELTA CHECK NOTED     REPEATED TO VERIFY     NO VISIBLE HEMOLYSIS   Chloride 97  96 - 112 mEq/L   CO2 25  19 - 32 mEq/L   Glucose, Bld 115 (*) 70 - 99 mg/dL   BUN 27 (*) 6 - 23 mg/dL   Creatinine, Ser 4.69  0.50 - 1.35 mg/dL   Calcium 7.9 (*) 8.4 - 10.5 mg/dL   Total Protein 6.4  6.0 - 8.3 g/dL   Albumin 2.2 (*) 3.5 - 5.2 g/dL   AST 30  0 - 37 U/L   Comment: NO VISIBLE HEMOLYSIS   ALT 14  0 - 53 U/L   Alkaline Phosphatase 71  39 - 117 U/L   Total Bilirubin 0.5  0.3 - 1.2 mg/dL  GFR calc non Af Amer 84 (*) >90 mL/min   GFR calc Af Amer >90  >90 mL/min   Comment: (NOTE)     The eGFR has been calculated using the CKD EPI equation.     This calculation has not been validated in all clinical situations.     eGFR's persistently <90 mL/min signify possible Chronic Kidney     Disease.  MAGNESIUM     Status: None   Collection Time    12/16/12  6:28 AM      Result Value Range   Magnesium 2.1  1.5 - 2.5 mg/dL  PHOSPHORUS     Status: None   Collection Time    12/16/12  6:28 AM      Result Value Range   Phosphorus 3.4  2.3 - 4.6 mg/dL  CBC     Status: Abnormal   Collection Time    12/16/12  6:28 AM      Result Value Range   WBC 16.6 (*) 4.0 - 10.5 K/uL   RBC 4.39  4.22 - 5.81 MIL/uL   Hemoglobin 12.7 (*) 13.0 - 17.0 g/dL   HCT 16.1 (*) 09.6 - 04.5 %   MCV 83.1   78.0 - 100.0 fL   MCH 28.9  26.0 - 34.0 pg   MCHC 34.8  30.0 - 36.0 g/dL   RDW 40.9  81.1 - 91.4 %   Platelets 405 (*) 150 - 400 K/uL  DIFFERENTIAL     Status: Abnormal   Collection Time    12/16/12  6:28 AM      Result Value Range   Neutrophils Relative % 79 (*) 43 - 77 %   Neutro Abs 13.0 (*) 1.7 - 7.7 K/uL   Lymphocytes Relative 10 (*) 12 - 46 %   Lymphs Abs 1.7  0.7 - 4.0 K/uL   Monocytes Relative 8  3 - 12 %   Monocytes Absolute 1.3 (*) 0.1 - 1.0 K/uL   Eosinophils Relative 3  0 - 5 %   Eosinophils Absolute 0.5  0.0 - 0.7 K/uL   Basophils Relative 0  0 - 1 %   Basophils Absolute 0.1  0.0 - 0.1 K/uL  TRIGLYCERIDES     Status: None   Collection Time    12/16/12  6:28 AM      Result Value Range   Triglycerides 95  <150 mg/dL   Comment: Performed at [facility]  GLUCOSE, CAPILLARY     Status: Abnormal   Collection Time    12/16/12  8:41 AM      Result Value Range   Glucose-Capillary 130 (*) 70 - 99 mg/dL    Radiology/Results: Ct Abdomen Pelvis W Contrast  12/15/2012   CLINICAL DATA:  Followup pneumoperitoneum. Evaluate inflammation the lower abdomen. Resolving C diff colitis.  EXAM: CT ABDOMEN AND PELVIS WITH CONTRAST  TECHNIQUE: Multidetector CT imaging of the abdomen and pelvis was performed using the standard protocol following bolus administration of intravenous contrast.  CONTRAST:  OMNIPAQUE IOHEXOL 300 MG/ML SOLN, 50mL OMNIPAQUE IOHEXOL 300 MG/ML SOLN  COMPARISON:  CT abdomen and pelvis 12/11/2012 and 12/05/2012  FINDINGS: Lung bases: There persistent bilateral of pleural effusions. The left pleural effusion appears slightly smaller on today's examination compared to 08/10/2012. The right pleural effusion is unchanged. Nodular airspace opacities in the right middle lobe and right lower lobe persistent consistent with pneumonia. There is bilateral lower lobe atelectasis.  A small pericardial effusion is present.  Heart size appears  normal.  Abdomen/pelvis: A  1.9 x 1.4 cm ill-defined low-density lesion in the left lobe of the liver is unchanged. Possible subtle 8 mm low-density lesion in the right lobe the liver on image number 29 that was not present are visible on recent prior studies. May be due to differences in timing of the scan relative to contrast administration. 5 mm circumscribed low-density lesion near the dome the liver was also not well visualized on recent prior study.  The volume of pneumoperitoneum appear similar to slightly decreased compared to the study of 12/11/2012.  Oral contrast has progressed to the level of the distal descending colon at the time of the exam. And nasogastric tube terminates in the distal stomach. The stomach is unremarkable.  Distal colonic and rectal wall discuss that distal sigmoid and rectal wall thickening persists but this appear to have decreased since the previous to CT examinations. Any long date area of fluid density with peripheral enhancement along the left side of the rectum is unchanged and measures 1.7 x 1.7 x 2.7 cm. Reactive size lymph nodes are seen adjacent to the rectum. There is also stranding in the presacral fat, unchanged.  Small bowel loops in the left abdomen remain mildly dilated, but appear slightly decreased in caliber compared to the study from 12/05/2012. No discrete small bowel wall thickening is identified. A discrete transition point is not seen.  Slightly cephalad and anterior to the proximal sigmoid colon, on image number 72 is a small rim enhancing fluid collection in the anterior left abdomen measuring approximately 3.7 by 1.6 cm axial dimension. This fluid collection is new and suspicious for a small abscess. No significant volume of ascites is identified.  The spleen, adrenal glands, pancreas, and kidneys are within normal limits. Left retro aortic renal vein noted. Abdominal aorta normal in caliber.  The urinary bladder contains a Foley catheter. There is a moderate amount of air within the  urinary bladder, likely related to Foley catheter presents. Prostate gland contains central calcification and is borderline enlarged.  Left inguinal hernia contains fat only.  Extensive degenerative changes of the spine. No acute or suspicious bony abnormality.  The liver is normal in size.  IMPRESSION: 1. Moderate volume of pneumoperitoneum appears similar to slightly decreased compared to CT of 12/11/2012. 2. Development of small focal abscess in the anterior left lower quadrant of the abdomen, measuring 3.7 x 1.6 cm axial dimension. 3. Decreasing distal colonic and rectal wall thickening suggests improving C diff colitis. Small bowel dilatation is slightly improved. A low grade partial small bowel obstruction is not excluded. 4. No change in oblong mass or fluid collection adjacent to the left aspect of the proximal rectum. This has central fluid density and peripheral enhancement and could reflect a small perirectal abscess or possibly lymphadenopathy with central necrosis. 5. Right middle lobe and right lower lobe nodular airspace opacities most consistent with pneumonia. Bilateral pleural effusions with overlying atelectasis. 6. Small pericardial effusion. 7. Foley catheter in place with associated air in the urinary bladder. 8. Several small low-density lesions in the liver. Suspicious mass lesion, particularly in the left lobe cannot be excluded. When the patient has current illness resolved, consider followup abdominal MRI with and without contrast when the patient can tolerate breath holding.   Electronically Signed   By: Britta Mccreedy M.D.   On: 12/15/2012 12:20   Dg Chest Port 1 View  12/15/2012   CLINICAL DATA:  Evaluate pneumonia, air within the abdomen  EXAM: PORTABLE CHEST -  1 VIEW  COMPARISON:  12/13/2012; 12/10/2012; chest CT -12/11/2012  FINDINGS: Grossly unchanged cardiac silhouette and mediastinal contours given patient rotation. Stable positioning of support apparatus. Overall improved  aeration of the lungs with persistent ill-defined heterogeneous opacities within the right mid and lower lung. There is a minimal amount of fluid within the right minor fissure. No definite evidence of edema appear No definite pleural effusion or pneumothorax. Unchanged bones.  The amount of pneumoperitoneum within the imaged upper abdomen is unchanged to minimally decreased in the interval.  IMPRESSION: 1.  Stable positioning of support apparatus.  No pneumothorax. 2. Improved aeration of the lungs with persistent right mid lung opacities, likely residual atelectasis. 3. Persistent findings of pneumoperitoneum within the imaged upper abdomen, unchanged to minimally decreased in the interval.   Electronically Signed   By: Simonne Come M.D.   On: 12/15/2012 08:19    Anti-infectives: Anti-infectives   Start     Dose/Rate Route Frequency Ordered Stop   12/13/12 1200  vancomycin (VANCOCIN) IVPB 1000 mg/200 mL premix  Status:  Discontinued     1,000 mg 200 mL/hr over 60 Minutes Intravenous Every 12 hours 12/13/12 0531 12/16/12 0938   12/11/12 1700  micafungin (MYCAMINE) 100 mg in sodium chloride 0.9 % 100 mL IVPB     100 mg 100 mL/hr over 1 Hours Intravenous Every 24 hours 12/11/12 1639     12/11/12 1645  micafungin (MYCAMINE) 100 mg in sodium chloride 0.9 % 100 mL IVPB  Status:  Discontinued     100 mg 100 mL/hr over 1 Hours Intravenous Daily 12/11/12 1639 12/11/12 1643   12/10/12 1200  vancomycin (VANCOCIN) 1,250 mg in sodium chloride 0.9 % 250 mL IVPB  Status:  Discontinued     1,250 mg 166.7 mL/hr over 90 Minutes Intravenous Every 12 hours 12/10/12 1027 12/13/12 0530   12/09/12 1200  vancomycin (VANCOCIN) 50 mg/mL oral solution 500 mg     500 mg Per Tube 4 times per day 12/09/12 1142     12/06/12 0600  vancomycin (VANCOCIN) 1,250 mg in sodium chloride 0.9 % 250 mL IVPB  Status:  Discontinued     1,250 mg 166.7 mL/hr over 90 Minutes Intravenous Every 24 hours 12/05/12 1006 12/05/12 1628    12/05/12 1800  vancomycin (VANCOCIN) 500 mg in sodium chloride irrigation 0.9 % 100 mL ENEMA  Status:  Discontinued     500 mg Rectal 4 times per day 12/05/12 1424 12/11/12 1037   12/05/12 1600  metroNIDAZOLE (FLAGYL) IVPB 500 mg     500 mg 100 mL/hr over 60 Minutes Intravenous Every 8 hours 12/05/12 1424 12/19/12 1559   12/05/12 1430  vancomycin (VANCOCIN) 50 mg/mL oral solution 500 mg  Status:  Discontinued     500 mg Per Tube 4 times per day 12/05/12 1424 12/05/12 1425   12/05/12 1015  piperacillin-tazobactam (ZOSYN) IVPB 3.375 g     3.375 g 12.5 mL/hr over 240 Minutes Intravenous Every 8 hours 12/05/12 1006     12/05/12 0845  vancomycin (VANCOCIN) IVPB 1000 mg/200 mL premix     1,000 mg 200 mL/hr over 60 Minutes Intravenous STAT 12/05/12 0830 12/05/12 1017   12/05/12 0830  piperacillin-tazobactam (ZOSYN) IVPB 3.375 g     3.375 g 100 mL/hr over 30 Minutes Intravenous STAT 12/05/12 0806 12/05/12 1017   12/05/12 0815  piperacillin-tazobactam (ZOSYN) IVPB 3.375 g  Status:  Discontinued     3.375 g 12.5 mL/hr over 240 Minutes Intravenous  Once 12/05/12 0804  12/05/12 0805   12/05/12 0815  vancomycin (VANCOCIN) IVPB 1000 mg/200 mL premix  Status:  Discontinued     1,000 mg 200 mL/hr over 60 Minutes Intravenous  Once 12/05/12 0813 12/05/12 0813      Assessment/Plan: Problem List: Patient Active Problem List   Diagnosis Date Noted  . Acute respiratory failure with hypoxia 12/08/2012  . Wheezes 12/07/2012  . Septic shock 12/05/2012  . Diarrhea 12/05/2012  . 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 Deaf 12/05/2012  . ARF (acute renal failure) 12/05/2012  . Secondary hypotension 12/05/2012  . SBO (small bowel obstruction) 12/05/2012  . HTN (hypertension) 12/05/2012  . Neuropathy 12/05/2012  . Clostridium difficile colitis 12/05/2012    Would continue observation management.   * No surgery found *    LOS: 11 days   Matt B. Daphine Deutscher, MD, [facility] [phone] beeper [phone]  12/16/2012 12:04 PM

## 2012-12-16 NOTE — Progress Notes (Signed)
PULMONARY  / CRITICAL CARE MEDICINE  Name: Victor Little MRN: [id number] DOB: February 28, 1945    ADMISSION DATE:  12/05/2012 CONSULTATION DATE:  12/07/12  REFERRING MD :  Penny Pia, MD PRIMARY SERVICE: PCCM  CHIEF COMPLAINT:  Acute respiratory failure.   BRIEF PATIENT DESCRIPTION:  67 years old male resident of a group home with PMH relevant for mental retardation, deafness, HTN. Admid cdiff, aspiration, vent.  SIGNIFICANT EVENTS / STUDIES:  11/22:  Intubation 11/24:  Remains vented, weaning 11/26   ct head>>> no acute 11/26  CT chest aspiration infil rt, small effusions, free air 11/26:  CT abdomen with free air in abdomen, no pressors 11/28:  CXR without interval changes, free air 11/30:  CT abd/pelvis >> b/l effusions, RML/RLL PNA, decreased pneumoperitoneum, 3.7 cm abscess LLQ  LINES / TUBES: Left IJ CVC 12/07/12>>> ETT 11/22 >>> 1126  CULTURES: 11/20:  Blood culture >>> Negative 11/20:  MRSA PCR >>> Negative 11/21:  Urine culture >>> Negative, Final 11/20:  C Diff PCR - Positive  ANTIBIOTICS: Zosyn:    11/20 >>> IV Flagyl:    11/20 >>> Rectal vancomycin:   11/20 >>> 11/26 Vancomycin PO 11/24 >>> Vancomycin IV 11/25 >>> 12/01 Micafungin  11/26 >>>  SUBJECTIVE: Tolerating pressure support weaning.  VITAL SIGNS: Temp:  [98.8 F (37.1 C)-99.9 F (37.7 C)] 99.1 F (37.3 C) (12/01 0800) Pulse Rate:  [55-85] 64 (12/01 0800) Resp:  [12-23] 23 (12/01 0800) BP: (114-153)/(68-90) 119/68 mmHg (12/01 0800) SpO2:  [95 %-100 %] 95 % (12/01 0800) FiO2 (%):  [30 %] 30 % (12/01 0835) Weight:  [175 lb 11.3 oz (79.7 kg)] 175 lb 11.3 oz (79.7 kg) (12/01 0500) VENTILATOR SETTINGS: Vent Mode:  [-] PRVC FiO2 (%):  [30 %] 30 % Set Rate:  [16 bmp] 16 bmp Vt Set:  [620 mL] 620 mL PEEP:  [5 cmH20] 5 cmH20 Pressure Support:  [10 cmH20] 10 cmH20 Plateau Pressure:  [13 cmH20-16 cmH20] 13 cmH20 INTAKE / OUTPUT: Intake/Output     11/30 0701 - 12/01 0700 12/01 0701 - 12/02 0700    I.V. (mL/kg) 520 (6.5) 40 (0.5)   IV Piggyback 1250    TPN 2400 100   Total Intake(mL/kg) 4170 (52.3) 140 (1.8)   Urine (mL/kg/hr) 1700 (0.9) 60 (0.3)   Emesis/NG output 1150 (0.6)    Total Output 2850 60   Net +1320 +80        Stool Occurrence 4 x      PHYSICAL EXAMINATION: General: no distress Neuro: moves extremities HEENT: ETT, OG tube in place PULM:  Scattered rhonchi, no wheeze CV:  Regular, no murmur GI: soft, mild tenderness, no guarding Extremities: 1+ edema  LABS: PULMONARY  Recent Labs Lab 12/10/12 0432  PHART 7.411  PCO2ART 31.4*  PO2ART 132.0*  HCO3 19.2*  TCO2 17.2  O2SAT 98.5    CBC  Recent Labs Lab 12/13/12 0530 12/15/12 0520 12/16/12 0628  HGB 11.3* 12.8* 12.7*  HCT 33.1* 37.1* 36.5*  WBC 15.4* 16.3* 16.6*  PLT 281 356 405*    CHEMISTRY  Recent Labs Lab 12/10/12 0554 12/11/12 0532 12/12/12 0400 12/13/12 0530 12/14/12 0520 12/15/12 0520 12/16/12 0628  NA 142 143 141 142 139 138 131*  K 3.7 3.6 3.1* 3.4* 3.3* 3.3* 4.4  CL 111 111 108 111 100 98 97  CO2 20 20 21 23 27 27 25   GLUCOSE 99 107* 99 134* 119* 123* 115*  BUN 13 17 19 20 20  26* 27*  CREATININE 1.03  0.99 0.99 1.00 1.11 1.14 0.98  CALCIUM 7.5* 7.5* 7.4* 7.4* 7.6* 7.7* 7.9*  MG 2.0 2.0 1.8 2.1  --   --  2.1  PHOS 3.2 3.1 3.0 2.9  --   --  3.4    LIVER  Recent Labs Lab 12/10/12 0554 12/13/12 0530 12/16/12 0628  AST 25 17 30   ALT 13 11 14   ALKPHOS 66 59 71  BILITOT 0.7 0.5 0.5  PROT 5.4* 5.3* 6.4  ALBUMIN 2.2* 1.9* 2.2*    ENDOCRINE CBG (last 3)   Recent Labs  12/15/12 2337 12/16/12 0321 12/16/12 0841  GLUCAP 116* 119* 130*     IMAGING x48h  Ct Abdomen Pelvis W Contrast  12/15/2012   CLINICAL DATA:  Followup pneumoperitoneum. Evaluate inflammation the lower abdomen. Resolving C diff colitis.  EXAM: CT ABDOMEN AND PELVIS WITH CONTRAST  TECHNIQUE: Multidetector CT imaging of the abdomen and pelvis was performed using the standard protocol  following bolus administration of intravenous contrast.  CONTRAST:  OMNIPAQUE IOHEXOL 300 MG/ML SOLN, 50mL OMNIPAQUE IOHEXOL 300 MG/ML SOLN  COMPARISON:  CT abdomen and pelvis 12/11/2012 and 12/05/2012  FINDINGS: Lung bases: There persistent bilateral of pleural effusions. The left pleural effusion appears slightly smaller on today's examination compared to 08/10/2012. The right pleural effusion is unchanged. Nodular airspace opacities in the right middle lobe and right lower lobe persistent consistent with pneumonia. There is bilateral lower lobe atelectasis.  A small pericardial effusion is present.  Heart size appears normal.  Abdomen/pelvis: A 1.9 x 1.4 cm ill-defined low-density lesion in the left lobe of the liver is unchanged. Possible subtle 8 mm low-density lesion in the right lobe the liver on image number 29 that was not present are visible on recent prior studies. May be due to differences in timing of the scan relative to contrast administration. 5 mm circumscribed low-density lesion near the dome the liver was also not well visualized on recent prior study.  The volume of pneumoperitoneum appear similar to slightly decreased compared to the study of 12/11/2012.  Oral contrast has progressed to the level of the distal descending colon at the time of the exam. And nasogastric tube terminates in the distal stomach. The stomach is unremarkable.  Distal colonic and rectal wall discuss that distal sigmoid and rectal wall thickening persists but this appear to have decreased since the previous to CT examinations. Any long date area of fluid density with peripheral enhancement along the left side of the rectum is unchanged and measures 1.7 x 1.7 x 2.7 cm. Reactive size lymph nodes are seen adjacent to the rectum. There is also stranding in the presacral fat, unchanged.  Small bowel loops in the left abdomen remain mildly dilated, but appear slightly decreased in caliber compared to the study from  12/05/2012. No discrete small bowel wall thickening is identified. A discrete transition point is not seen.  Slightly cephalad and anterior to the proximal sigmoid colon, on image number 72 is a small rim enhancing fluid collection in the anterior left abdomen measuring approximately 3.7 by 1.6 cm axial dimension. This fluid collection is new and suspicious for a small abscess. No significant volume of ascites is identified.  The spleen, adrenal glands, pancreas, and kidneys are within normal limits. Left retro aortic renal vein noted. Abdominal aorta normal in caliber.  The urinary bladder contains a Foley catheter. There is a moderate amount of air within the urinary bladder, likely related to Foley catheter presents. Prostate gland contains central calcification and  is borderline enlarged.  Left inguinal hernia contains fat only.  Extensive degenerative changes of the spine. No acute or suspicious bony abnormality.  The liver is normal in size.  IMPRESSION: 1. Moderate volume of pneumoperitoneum appears similar to slightly decreased compared to CT of 12/11/2012. 2. Development of small focal abscess in the anterior left lower quadrant of the abdomen, measuring 3.7 x 1.6 cm axial dimension. 3. Decreasing distal colonic and rectal wall thickening suggests improving C diff colitis. Small bowel dilatation is slightly improved. A low grade partial small bowel obstruction is not excluded. 4. No change in oblong mass or fluid collection adjacent to the left aspect of the proximal rectum. This has central fluid density and peripheral enhancement and could reflect a small perirectal abscess or possibly lymphadenopathy with central necrosis. 5. Right middle lobe and right lower lobe nodular airspace opacities most consistent with pneumonia. Bilateral pleural effusions with overlying atelectasis. 6. Small pericardial effusion. 7. Foley catheter in place with associated air in the urinary bladder. 8. Several small  low-density lesions in the liver. Suspicious mass lesion, particularly in the left lobe cannot be excluded. When the patient has current illness resolved, consider followup abdominal MRI with and without contrast when the patient can tolerate breath holding.   Electronically Signed   By: Britta Mccreedy M.D.   On: 12/15/2012 12:20   Dg Chest Port 1 View  12/15/2012   CLINICAL DATA:  Evaluate pneumonia, air within the abdomen  EXAM: PORTABLE CHEST - 1 VIEW  COMPARISON:  12/13/2012; 12/10/2012; chest CT -12/11/2012  FINDINGS: Grossly unchanged cardiac silhouette and mediastinal contours given patient rotation. Stable positioning of support apparatus. Overall improved aeration of the lungs with persistent ill-defined heterogeneous opacities within the right mid and lower lung. There is a minimal amount of fluid within the right minor fissure. No definite evidence of edema appear No definite pleural effusion or pneumothorax. Unchanged bones.  The amount of pneumoperitoneum within the imaged upper abdomen is unchanged to minimally decreased in the interval.  IMPRESSION: 1.  Stable positioning of support apparatus.  No pneumothorax. 2. Improved aeration of the lungs with persistent right mid lung opacities, likely residual atelectasis. 3. Persistent findings of pneumoperitoneum within the imaged upper abdomen, unchanged to minimally decreased in the interval.   Electronically Signed   By: Simonne Come M.D.   On: 12/15/2012 08:19   ASSESSMENT / PLAN:  PULMONARY A: Acute hypoxemic respiratory failure Aspiration PNA  P:   -pressure support wean as tolerated >> might be ready for extubation soon -f/u CXR  CARDIOVASCULAR A:  Hypertension  P:  -prn IV Metoprolol for SBP > 160  RENAL A:   Acute Renal Failure - Resolved Lactic Acidosis - Resolved Volume Overload Hypokalemia  P:   -lasix 40 mg IV x one 12/01 -f/u and replace electrolytes as needed  GASTROINTESTINAL A:   C. Diff colitis SBO vs  ileus - Resolving Pneumoperitoneum on CT 11/26  --> Surgery following  P:   -Pepcid for SUP -Continue TPN for nutrition >> transition to enteral feeding when okay with surgery  HEMATOLOGIC A:   Leukocytosis - Stable  Secondary to C. Diff infection and now questionable intraabdominal abcess P:  -f/u CBC  INFECTIOUS A:   Aspiration pneumonitis/PNA C. Diff Colitis P:   -Day 12 flagyl, enteral vancomycin for C diff colitis -Day 12 zosyn for aspiration pneumonia and peritonitis -Day 5 micafungin for peritonitis -will d/c IV vancomycin  ENDOCRINE A:   Hyperglycemia  P: -SSI  NEUROLOGIC A:   Encephalopathy - 2nd to peritonitis, sepsis, renal failure, and respiratory failure. Hx of MR, deaf, mute. P:   -prn fentanyl for pain control -minimize sedation to assist with vent weaning.  CC time 35 minutes.  Coralyn Helling, MD [facility] Pulmonary/Critical Care 12/16/2012, 9:35 AM Pager:  [phone] After 3pm call: [phone]

## 2012-12-16 NOTE — Progress Notes (Signed)
PARENTERAL NUTRITION CONSULT NOTE - Follow-up  Pharmacy Consult for TNA Indication: NPO  No Known Allergies  Patient Measurements: Height: 6' (182.9 cm) Weight: 175 lb 11.3 oz (79.7 kg) IBW/kg (Calculated) : 77.6 Adjusted Body Weight: 81 kg Weight PTA: 92 kg  Vital Signs: Temp: 99.1 F (37.3 C) (12/01 0600) Temp src: Core (Comment) (12/01 0400) BP: 139/80 mmHg (12/01 0600) Pulse Rate: 85 (12/01 0600) Intake/Output from previous day: 11/30 0701 - 12/01 0700 In: 4030 [I.V.:480; IV Piggyback:1250; TPN:2300] Out: 2400 [Urine:1700; Emesis/NG output:700] Intake/Output from this shift:    Labs:  Recent Labs  12/15/12 0520 12/16/12 0628  WBC 16.3* 16.6*  HGB 12.8* 12.7*  HCT 37.1* 36.5*  PLT 356 405*     Recent Labs  12/14/12 0520 12/15/12 0520  NA 139 138  K 3.3* 3.3*  CL 100 98  CO2 27 27  GLUCOSE 119* 123*  BUN 20 26*  CREATININE 1.11 1.14  CALCIUM 7.6* 7.7*   Estimated Creatinine Clearance: 70 ml/min (by C-G formula based on Cr of 1.14).    Recent Labs  12/15/12 1931 12/15/12 2337 12/16/12 0321  GLUCAP 117* 116* 119*   Medical History: Past Medical History  Diagnosis Date  . Mental retardation   . Organic brain syndrome   . Hypertension   . Deaf    Medications:  Scheduled:  . antiseptic oral rinse  1 application Mouth Rinse QID  . chlorhexidine  15 mL Mouth/Throat BID  . famotidine (PEPCID) IV  20 mg Intravenous Q24H  . insulin aspart  0-9 Units Subcutaneous Q4H  . metronidazole  500 mg Intravenous Q8H  . micafungin (MYCAMINE) IV  100 mg Intravenous Q24H  . piperacillin-tazobactam (ZOSYN)  IV  3.375 g Intravenous Q8H  . vancomycin  500 mg Per Tube Q6H  . vancomycin  1,000 mg Intravenous Q12H   Infusions:  . Marland KitchenTPN (CLINIMIX-E) Adult 90 mL/hr at 12/15/12 1723   And  . fat emulsion 250 mL (12/15/12 1723)   Insulin Requirements in the past 24 hours:  No hx of DM, 3 units Novolog.  Current Nutrition:  Clinimix E at 74ml/hr + Lipids 20%  at 10 ml/hr. NPO.  IVF: None  Assessment: 67 yo with hx HTN, mental retardation, deafness who presented to ER from group home 11/20 s/p 12 hours of vomiting and diarrhea and initial BP of 81/50. Complaint of RLQ pain, found positive for CDiff. Pneumoperitoneum on CT, probable ruptured viscus, ileus vs partial SBO, colitis. Plan per CCS: observe x 24 hr, re-evaluate for need for laparotomy, unless clinical picture worsens. Tube feed not begun d/t suspected GI perf, began TNA via TL central line placed 11/22.   12/1: Remains on broad spectrum abx/antifungals for ? perforation and CDiff. Pt continues in ICU on ventilator and has not been able to wean. No diarrhea per surgery notes.  CT abd and pelvis showed improvement in pneumoperitoneum but now has a small focal abscess.   Glucose: 116-136  Lytes: K improved to wnl. Na low, down from yesterday. Corr Ca 9.34.    LFT's WNL  Triglycerides: 95  Pre-albumin: 11/28 = 7.8  Nutritional Goals/ 24 hr: per RD(11/28) KCal: 2046  Protein: 110-125 grams  Fluid: >2 L/day  Goal rate Clinimix E 5/15 of 35ml/hr + 20% lipids at 61ml/hr to deliver 114g protein and 2099 KCal per 24 hr.  Plan:    With next bag, increase Clinimix E 5/15 to 48ml/hr.  Cont lipids 20% at 10 ml/hr.  TNA to contain standard  multivitamins and trace elements.  Continue SSI sensitive q4h.  TNA lab panels on Mondays & Thursdays.  Bmet in am.  F/u daily.  Charolotte Eke, PharmD, pager [phone]. 12/16/2012,8:48 AM.

## 2012-12-16 NOTE — Procedures (Signed)
Extubation Procedure Note  Patient Details:   Name: Victor Little DOB: 12/17/1945 MRN: [id number]   Airway Documentation:     Evaluation  O2 sats: 97 Complications: none Patient tolerated procedure well. Bilateral Breath Sounds: Rhonchi Suctioning: Airway Pt did not speak  Per MD order, pt extubated and placed on 2L nasal cannula.  Tolerated well and was stable throughout the procedure.  Revonda Humphrey 12/16/2012, 10:02 AM

## 2012-12-16 NOTE — Progress Notes (Signed)
  CARE MANAGEMENT NOTE 12/16/2012  Patient:  Victor Little, Victor Little   Account Number:  [id number]  Date Initiated:  12/05/2012  Documentation initiated by:  DAVIS,RHONDA  Subjective/Objective Assessment:   pt with mental challenges and now hypotensive, positive c-diff,     Action/Plan:   tbd/from [facility]   Anticipated DC Date:  12/19/2012   Anticipated DC Plan:  GROUP HOME  In-house referral  Clinical Social Worker      DC Planning Services  NA      [facility] Choice  NA   Choice offered to / List presented to:  NA   DME arranged  NA      DME agency  NA     HH arranged  NA      HH agency  NA   Status of service:  In process, will continue to follow Medicare Important Message given?  NA - LOS <3 / Initial given by admissions (If response is "NO", the following Medicare IM given date fields will be blank) Date Medicare IM given:   Date Additional Medicare IM given:    Discharge Disposition:    Per UR Regulation:  Reviewed for med. necessity/level of care/duration of stay  If discussed at Long Length of Stay Meetings, dates discussed:   12/10/2012    Comments:  [id number] Earlene Plater RN, BSN, [place] [phone] Chart Reviewed for discharge and hospital needs. Discharge needs at time of review:  None present will follow for needs.  patient extubated to 30% o2 via [place]/this am at 1100. Review of patient progress due on [id number].   [id number] Earlene Plater, RN,BSN,CCM: Case management [phone] Chart reviewed and updated.  Next chart review due on [id number].  VENT DAY 5-weaning started.  possible new need for surgical intervention due to air in the abd cavity. Needs for discharge at time of review:  none   12/09/2012 Colleen Can BSN RN CCM [phone] Conc review completed. Pt is intubated and on ventilator. Discharge plans are for return to grp home. CSW following.   [id number] Earlene Plater, RN,BSN,CCM: Case management [phone] Chart reviewed and  updated.  Next chart review due on [id number]. Needs for discharge at time of review:  None

## 2012-12-16 NOTE — Progress Notes (Signed)
NUTRITION FOLLOW UP  Intervention:   - TPN per pharmacy - Will continue to monitor   Nutrition Dx:   Inadequate oral intake related to altered GI function as evidenced by npo status and vent; ongoing but now only as evidenced by NPO as pt off vent  Goal:   TPN to meet >90% of estimated nutritional needs - met   Monitor:   Weights, labs, TPN  Assessment:   67 years old male resident of a nursing home with PMH relevant for mental retardation, deafness, HTN. Admid cdiff, aspiration, vent.  11/25 - Per RN pt is being weaned off of propofol; unable to wean pt off vent yesterday but will re-attempt to wean again today. Per RN pt receiving Vanc enemas QID with minimal stool output (smear BM today) and NG tube has put out 1200 ml since admission. Per MD surgery note, bowel sounds are normal, x-ray is consistent with diffuse ileus, no evidence of obstruction.   11/26 - Extubated considered if pt becomes more awake. Found to have C. Difficile with diarrhea like stool. RD consulted for TF initiation/management. Weight up 20 pounds since admit. Per conversation with RN, TF not started due to CT showing free air in abdomen with concern for pneumoperitoneum.   11/28 - TPN started yesterday r/t pneumoperitoneum on CT, probable ruptured viscus, ileus vs partial SBO, colitis with probable GI perforation per pharmD notes.   12/1 - Pt extubated. Pt with C. Difficile. Plan per CCS: observe x 24 hr, re-evaluate for need for laparotomy, unless clinical picture worsens. Tube feed not begun d/t suspected GI perf, continue TPN.   Current TPN: Clinimix E 5/15 @ 90 ml/hr and lipids @ 10 ml/hr. Provides 2038 kcal, and 108 grams protein per day. Meets 97% minimum estimated energy needs and 98% minimum estimated protein needs.  Plan is to advance to goal rate TPN with next bag of Clinimix E 5/15 of 33ml/hr + 20% lipids at 22ml/hr to deliver 114g protein and 2099 KCal per 24 hr which will meet 100% of estimated  nutritional needs  - Sodium slightly low - PALB low but improving - Triglycerides WNL - CBGs < 150 mg/dL  CBG (last 3)   Recent Labs  12/16/12 0321 12/16/12 0841 12/16/12 1235  GLUCAP 119* 130* 120*     Height: Ht Readings from Last 1 Encounters:  12/07/12 6' (1.829 m)    Weight Status:   Wt Readings from Last 1 Encounters:  12/16/12 175 lb 11.3 oz (79.7 kg)  Admit wt:        182 lb 5 oz (82.6 kg)   Net I/Os: -1.7L   Re-estimated needs:  Kcal: 2100-2300 Protein: 110-125 grams Fluid: >2 L/day  Skin: Non-pitting RUE, LUE, RLE, LLE edema  Diet Order: NPO   Intake/Output Summary (Last 24 hours) at 12/16/12 1514 Last data filed at 12/16/12 1407  Gross per 24 hour  Intake   3850 ml  Output   4865 ml  Net  -1015 ml    Last BM: 12/1   Labs:   Recent Labs Lab 12/12/12 0400 12/13/12 0530 12/14/12 0520 12/15/12 0520 12/16/12 0628  NA 141 142 139 138 131*  K 3.1* 3.4* 3.3* 3.3* 4.4  CL 108 111 100 98 97  CO2 21 23 27 27 25   BUN 19 20 20  26* 27*  CREATININE 0.99 1.00 1.11 1.14 0.98  CALCIUM 7.4* 7.4* 7.6* 7.7* 7.9*  MG 1.8 2.1  --   --  2.1  PHOS 3.0  2.9  --   --  3.4  GLUCOSE 99 134* 119* 123* 115*    CBG (last 3)   Recent Labs  12/16/12 0321 12/16/12 0841 12/16/12 1235  GLUCAP 119* 130* 120*    Scheduled Meds: . antiseptic oral rinse  1 application Mouth Rinse QID  . chlorhexidine  15 mL Mouth/Throat BID  . famotidine (PEPCID) IV  20 mg Intravenous Q24H  . insulin aspart  0-9 Units Subcutaneous Q4H  . metronidazole  500 mg Intravenous Q8H  . micafungin (MYCAMINE) IV  100 mg Intravenous Q24H  . piperacillin-tazobactam (ZOSYN)  IV  3.375 g Intravenous Q8H  . vancomycin  500 mg Per Tube Q6H    Continuous Infusions: . Marland KitchenTPN (CLINIMIX-E) Adult 90 mL/hr at 12/15/12 1723   And  . fat emulsion 250 mL (12/15/12 1723)  . Marland KitchenTPN (CLINIMIX-E) Adult     And  . fat emulsion      Levon Hedger MS, RD, LDN [phone] Pager [phone] After Hours  Pager

## 2012-12-17 ENCOUNTER — Inpatient Hospital Stay (HOSPITAL_COMMUNITY): Payer: PRIVATE HEALTH INSURANCE

## 2012-12-17 LAB — GLUCOSE, CAPILLARY
Glucose-Capillary: 113 mg/dL — ABNORMAL HIGH (ref 70–99)
Glucose-Capillary: 126 mg/dL — ABNORMAL HIGH (ref 70–99)
Glucose-Capillary: 101 mg/dL — ABNORMAL HIGH (ref 70–99)

## 2012-12-17 LAB — BASIC METABOLIC PANEL
BUN: 27 mg/dL — ABNORMAL HIGH (ref 6–23)
Chloride: 97 mEq/L (ref 96–112)
Glucose, Bld: 114 mg/dL — ABNORMAL HIGH (ref 70–99)
Potassium: 3.7 mEq/L (ref 3.5–5.1)

## 2012-12-17 MED ORDER — INSULIN ASPART 100 UNIT/ML ~~LOC~~ SOLN
0.0000 [IU] | Freq: Three times a day (TID) | SUBCUTANEOUS | Status: DC
  Administered 2012-12-19 – 2012-12-25 (×16): 1 [IU] via SUBCUTANEOUS

## 2012-12-17 MED ORDER — TRACE MINERALS CR-CU-F-FE-I-MN-MO-SE-ZN IV SOLN
INTRAVENOUS | Status: AC
  Administered 2012-12-17: 17:00:00 via INTRAVENOUS
  Filled 2012-12-17: qty 2280

## 2012-12-17 MED ORDER — FAT EMULSION 20 % IV EMUL
250.0000 mL | INTRAVENOUS | Status: AC
  Administered 2012-12-17: 250 mL via INTRAVENOUS
  Filled 2012-12-17: qty 250

## 2012-12-17 MED ORDER — POTASSIUM CHLORIDE 10 MEQ/50ML IV SOLN
10.0000 meq | INTRAVENOUS | Status: AC
  Administered 2012-12-17 (×3): 10 meq via INTRAVENOUS
  Filled 2012-12-17 (×3): qty 50

## 2012-12-17 MED ORDER — HEPARIN SODIUM (PORCINE) 5000 UNIT/ML IJ SOLN
5000.0000 [IU] | Freq: Three times a day (TID) | INTRAMUSCULAR | Status: DC
  Administered 2012-12-17 – 2012-12-22 (×16): 5000 [IU] via SUBCUTANEOUS
  Filled 2012-12-17 (×18): qty 1

## 2012-12-17 MED ORDER — ALBUTEROL SULFATE (5 MG/ML) 0.5% IN NEBU
2.5000 mg | INHALATION_SOLUTION | RESPIRATORY_TRACT | Status: DC | PRN
Start: 2012-12-17 — End: 2012-12-27

## 2012-12-17 NOTE — Progress Notes (Signed)
PARENTERAL NUTRITION CONSULT NOTE - Follow-up  Pharmacy Consult for TNA Indication: NPO  No Known Allergies  Patient Measurements: Height: 6' (182.9 cm) Weight: 173 lb 8 oz (78.7 kg) IBW/kg (Calculated) : 77.6 Adjusted Body Weight: 81 kg Weight PTA: 92 kg  Vital Signs: Temp: 99.1 F (37.3 C) (12/02 0600) Temp src: Core (Comment) (12/02 0400) BP: 144/76 mmHg (12/02 0600) Pulse Rate: 73 (12/02 0600) Intake/Output from previous day: 12/01 0701 - 12/02 0700 In: 3605 [I.V.:120; NG/GT:100; IV Piggyback:920; TPN:2465] Out: 4960 [Urine:3810; Emesis/NG output:1150] Intake/Output from this shift:    Labs:  Recent Labs  12/15/12 0520 12/16/12 0628  WBC 16.3* 16.6*  HGB 12.8* 12.7*  HCT 37.1* 36.5*  PLT 356 405*     Recent Labs  12/15/12 0520 12/16/12 0628 12/17/12 0400  NA 138 131* 132*  K 3.3* 4.4 3.7  CL 98 97 97  CO2 27 25 25   GLUCOSE 123* 115* 114*  BUN 26* 27* 27*  CREATININE 1.14 0.98 1.05  CALCIUM 7.7* 7.9* 7.9*  MG  --  2.1  --   PHOS  --  3.4  --   PROT  --  6.4  --   ALBUMIN  --  2.2*  --   AST  --  30  --   ALT  --  14  --   ALKPHOS  --  71  --   BILITOT  --  0.5  --   PREALBUMIN  --  17.5*  --   TRIG  --  95  --    Estimated Creatinine Clearance: 76 ml/min (by C-G formula based on Cr of 1.05).    Recent Labs  12/16/12 1927 12/16/12 2329 12/17/12 0253  GLUCAP 120* 113* 126*   Medical History: Past Medical History  Diagnosis Date  . Mental retardation   . Organic brain syndrome   . Hypertension   . Deaf    Medications:  Scheduled:  . antiseptic oral rinse  1 application Mouth Rinse QID  . chlorhexidine  15 mL Mouth/Throat BID  . famotidine (PEPCID) IV  20 mg Intravenous Q24H  . insulin aspart  0-9 Units Subcutaneous Q4H  . metronidazole  500 mg Intravenous Q8H  . micafungin (MYCAMINE) IV  100 mg Intravenous Q24H  . piperacillin-tazobactam (ZOSYN)  IV  3.375 g Intravenous Q8H  . vancomycin  500 mg Per Tube Q6H   Infusions:  .  Marland KitchenTPN (CLINIMIX-E) Adult 95 mL/hr at 12/16/12 1815   And  . fat emulsion     Insulin Requirements in the past 24 hours:  No hx of DM, 2 units Novolog.  Current Nutrition:  Clinimix E at 33ml/hr + Lipids 20% at 10 ml/hr. NPO.  IVF: None  Assessment: 67 yo with hx HTN, mental retardation, deafness who presented to ER from group home 11/20 s/p 12 hours of vomiting and diarrhea and initial BP of 81/50. Complaint of RLQ pain, found positive for CDiff. Pneumoperitoneum on CT, probable ruptured viscus, ileus vs partial SBO, colitis. Plan per CCS: observe x 24 hr, re-evaluate for need for laparotomy, unless clinical picture worsens. Tube feed not begun d/t suspected GI perf, began TNA via TL central line placed 11/22.   11/30: Abd CT showed improvement but a new small abscess. 12/1: Extubated. NG output decreased.   Glucose: 113-126  Lytes: K wnl but trended down after no extra KCl given yesterday. Na remains low but stable last 24hrs. Corr Ca 9.34.    LFT's: wnl  Triglycerides: 95  Pre-albumin: increased to 17.5  Nutritional Goals/ 24 hr: per RD(12/1) KCal: 2100-2300 Protein: 110-125 grams  Fluid: >2 L/day  Goal rate Clinimix E 5/15 of 45ml/hr + 20% lipids at 36ml/hr to deliver 114g protein and 2099 KCal per 24 hr.  Plan:    Cont Clinimix E 5/15 at 73ml/hr.  Cont lipids 20% at 10 ml/hr.  TNA to contain standard multivitamins and trace elements.  Continue SSI sensitive but change frequency to q8h to reduce sticks.  TNA lab panels on Mondays & Thursdays.  Give KCl IV today to keep K in normal range and recheck Bmet in am.  Hopefully can start enteral diet soon.  Charolotte Eke, PharmD, pager [phone]. 12/17/2012,7:51 AM.

## 2012-12-17 NOTE — Progress Notes (Signed)
PULMONARY  / CRITICAL CARE MEDICINE  Name: Victor Little MRN: [id number] DOB: June 21, 1945    ADMISSION DATE:  12/05/2012 CONSULTATION DATE:  12/07/12  REFERRING MD :  Penny Pia, MD PRIMARY SERVICE: PCCM  CHIEF COMPLAINT:  Acute respiratory failure.   BRIEF PATIENT DESCRIPTION:  67 yo male from residential group home with C diff colitis, possible SBO vs ileus and sepsis.  Hx of mental retardation, deaf, mute.  SIGNIFICANT EVENTS: 11/20 Admit 11/22 Aspiration, VDRF, CCS consulted, PCCM assumed care 11/26 Pneumoperitoneum >> conservative management 12/01 Extubated  STUDIES:  11/26 CT head >> no acute findings 11/26 CT chest >> Rt infiltrate, small effusions, pneumoperitoneum 11/26 CT abdomen >> pneumoperitoneum 11/30 CT abd/pelvis >> b/l effusions, RML/RLL PNA, decreased pneumoperitoneum, 3.7 cm abscess LLQ  LINES / TUBES: Left IJ CVL 11/22 >> ETT 11/22 >>> 12/01  CULTURES: 11/20 C diff PCR >> positive 11/20 Urine >> negative  11/20 Blood >> negative  11/25 Blood >> negative  ANTIBIOTICS: Flagyl 11/20 >>  Zosyn 11/20 >>  Enteral Vancomycin 11/20 >>  IV Vancomycin 11/24 >> 12/01 Micafungin 11/26 >>   SUBJECTIVE: Respiratory status, hemodynamics stable.  VITAL SIGNS: Temp:  [98.8 F (37.1 C)-99.5 F (37.5 C)] 99.1 F (37.3 C) (12/02 0600) Pulse Rate:  [62-86] 73 (12/02 0600) Resp:  [12-28] 26 (12/02 0600) BP: (96-144)/(57-80) 144/76 mmHg (12/02 0600) SpO2:  [91 %-100 %] 94 % (12/02 0600) FiO2 (%):  [30 %] 30 % (12/01 0835) Weight:  [173 lb 8 oz (78.7 kg)] 173 lb 8 oz (78.7 kg) (12/02 0500) Room air  INTAKE / OUTPUT: Intake/Output     12/01 0701 - 12/02 0700 12/02 0701 - 12/03 0700   I.V. (mL/kg) 120 (1.5)    NG/GT 100    IV Piggyback 920    TPN 2465    Total Intake(mL/kg) 3605 (45.8)    Urine (mL/kg/hr) 3810 (2)    Emesis/NG output 1150 (0.6)    Total Output 4960     Net -1355          Stool Occurrence 1 x      PHYSICAL  EXAMINATION: General: no distress Neuro: moves extremities HEENT: NG tube in place PULM:  Scattered rhonchi, no wheeze CV:  Regular, no murmur GI: soft, mild tenderness, no guarding Extremities: 1+ edema  LABS: CBC Recent Labs     12/15/12  0520  12/16/12  0628  WBC  16.3*  16.6*  HGB  12.8*  12.7*  HCT  37.1*  36.5*  PLT  356  405*   BMET Recent Labs     12/15/12  0520  12/16/12  0628  12/17/12  0400  NA  138  131*  132*  K  3.3*  4.4  3.7  CL  98  97  97  CO2  27  25  25   BUN  26*  27*  27*  CREATININE  1.14  0.98  1.05  GLUCOSE  123*  115*  114*    Electrolytes Recent Labs     12/15/12  0520  12/16/12  0628  12/17/12  0400  CALCIUM  7.7*  7.9*  7.9*  MG   --   2.1   --   PHOS   --   3.4   --    Liver Enzymes Recent Labs     12/16/12  0628  AST  30  ALT  14  ALKPHOS  71  BILITOT  0.5  ALBUMIN  2.2*  Glucose Recent Labs     12/16/12  0841  12/16/12  1235  12/16/12  1656  12/16/12  1927  12/16/12  2329  12/17/12  0253  GLUCAP  130*  120*  115*  120*  113*  126*    Imaging Ct Abdomen Pelvis W Contrast  12/15/2012   CLINICAL DATA:  Followup pneumoperitoneum. Evaluate inflammation the lower abdomen. Resolving C diff colitis.  EXAM: CT ABDOMEN AND PELVIS WITH CONTRAST  TECHNIQUE: Multidetector CT imaging of the abdomen and pelvis was performed using the standard protocol following bolus administration of intravenous contrast.  CONTRAST:  OMNIPAQUE IOHEXOL 300 MG/ML SOLN, 50mL OMNIPAQUE IOHEXOL 300 MG/ML SOLN  COMPARISON:  CT abdomen and pelvis 12/11/2012 and 12/05/2012  FINDINGS: Lung bases: There persistent bilateral of pleural effusions. The left pleural effusion appears slightly smaller on today's examination compared to 08/10/2012. The right pleural effusion is unchanged. Nodular airspace opacities in the right middle lobe and right lower lobe persistent consistent with pneumonia. There is bilateral lower lobe atelectasis.  A small  pericardial effusion is present.  Heart size appears normal.  Abdomen/pelvis: A 1.9 x 1.4 cm ill-defined low-density lesion in the left lobe of the liver is unchanged. Possible subtle 8 mm low-density lesion in the right lobe the liver on image number 29 that was not present are visible on recent prior studies. May be due to differences in timing of the scan relative to contrast administration. 5 mm circumscribed low-density lesion near the dome the liver was also not well visualized on recent prior study.  The volume of pneumoperitoneum appear similar to slightly decreased compared to the study of 12/11/2012.  Oral contrast has progressed to the level of the distal descending colon at the time of the exam. And nasogastric tube terminates in the distal stomach. The stomach is unremarkable.  Distal colonic and rectal wall discuss that distal sigmoid and rectal wall thickening persists but this appear to have decreased since the previous to CT examinations. Any long date area of fluid density with peripheral enhancement along the left side of the rectum is unchanged and measures 1.7 x 1.7 x 2.7 cm. Reactive size lymph nodes are seen adjacent to the rectum. There is also stranding in the presacral fat, unchanged.  Small bowel loops in the left abdomen remain mildly dilated, but appear slightly decreased in caliber compared to the study from 12/05/2012. No discrete small bowel wall thickening is identified. A discrete transition point is not seen.  Slightly cephalad and anterior to the proximal sigmoid colon, on image number 72 is a small rim enhancing fluid collection in the anterior left abdomen measuring approximately 3.7 by 1.6 cm axial dimension. This fluid collection is new and suspicious for a small abscess. No significant volume of ascites is identified.  The spleen, adrenal glands, pancreas, and kidneys are within normal limits. Left retro aortic renal vein noted. Abdominal aorta normal in caliber.  The urinary  bladder contains a Foley catheter. There is a moderate amount of air within the urinary bladder, likely related to Foley catheter presents. Prostate gland contains central calcification and is borderline enlarged.  Left inguinal hernia contains fat only.  Extensive degenerative changes of the spine. No acute or suspicious bony abnormality.  The liver is normal in size.  IMPRESSION: 1. Moderate volume of pneumoperitoneum appears similar to slightly decreased compared to CT of 12/11/2012. 2. Development of small focal abscess in the anterior left lower quadrant of the abdomen, measuring 3.7 x 1.6 cm  axial dimension. 3. Decreasing distal colonic and rectal wall thickening suggests improving C diff colitis. Small bowel dilatation is slightly improved. A low grade partial small bowel obstruction is not excluded. 4. No change in oblong mass or fluid collection adjacent to the left aspect of the proximal rectum. This has central fluid density and peripheral enhancement and could reflect a small perirectal abscess or possibly lymphadenopathy with central necrosis. 5. Right middle lobe and right lower lobe nodular airspace opacities most consistent with pneumonia. Bilateral pleural effusions with overlying atelectasis. 6. Small pericardial effusion. 7. Foley catheter in place with associated air in the urinary bladder. 8. Several small low-density lesions in the liver. Suspicious mass lesion, particularly in the left lobe cannot be excluded. When the patient has current illness resolved, consider followup abdominal MRI with and without contrast when the patient can tolerate breath holding.   Electronically Signed   By: Britta Mccreedy M.D.   On: 12/15/2012 12:20   Dg Chest Port 1 View  12/17/2012   CLINICAL DATA:  Pneumonia, pleural effusion  EXAM: PORTABLE CHEST - 1 VIEW  COMPARISON:  Portable exam 0507 hr compared to 12/15/2012  FINDINGS: Nasogastric tube extends into stomach.  Left jugular central venous catheter tip  projects over left brachiocephalic vein near SVC confluence.  Stable heart size and mediastinal contours.  Right basilar atelectasis.  Persistent accentuation of interstitial markings bilaterally, greater on right.  Decreased pneumoperitoneum since previous exam, noted previously.  No pneumothorax.  IMPRESSION: Decreased pneumoperitoneum.  Right basilar atelectasis.  Suspect mild diffuse bilateral interstitial infiltrates, right greater than left, question edema versus infection.  a   Electronically Signed   By: Ulyses Southward M.D.   On: 12/17/2012 08:13      ASSESSMENT / PLAN:  PULMONARY A: Acute hypoxemic respiratory failure 2nd to aspiration pneumonitis - improved. P:   -oxygen as needed to keep SpO2 > 92% -f/u CXR intermittently -bronchial hygiene  CARDIOVASCULAR A:  Hx of hypertension. P:  -prn IV Metoprolol for SBP > 160 -hold outpt lotensin, lasix, amlodipine for now  RENAL A:   Acute Renal Failure, Lactic Acidosis - Resolved. Volume Overload >> improved. Hypokalemia >> improved. P:   -even fluid balance -f/u and replace electrolytes as needed -d/c foley and place condom cath  GASTROINTESTINAL A:   Diarrhea/colitis 2nd to C diff - improved. Ileus vs. SBO - improved. Pneumoperitoneum on CT abd 11/26 - Abd exam benign. ?abscess on CT abd 11/30. Nutrition. P:   -Pepcid for SUP -TNA for now >> transition to enteral feeds when okay with surgery -conservative management for pneumoperitoneum per CCS  HEMATOLOGIC A:   Leukocytosis 2nd to sepsis. P:  -f/u CBC -SQ heparin for DVT prevention  INFECTIOUS A:   C diff colitis. Aspiration pneumonitis. P:   -Day 13 flagyl, enteral vancomycin for C diff colitis >> plan to d/c after doses on 12/03 -Day 13 zosyn for aspiration pneumonia and peritonitis >> continue for now with concern for abd abscess -Day 6 micafungin for peritonitis >> continue for now with concern for abd abscess -keep CVL in place while getting    ENDOCRINE A:   Hyperglycemia P: -SSI  NEUROLOGIC A:   Acute encephalopathy - 2nd to peritonitis, sepsis, renal failure, and respiratory failure. Hx of MR, deaf, mute. Deconditioning. P:   -prn fentanyl for pain control -mobilize, PT assessment -hold outpt neurotin, risperdal for now  Summary: Change to SDU status.  Transfer to [facility] 12/03 and PCCM sign off.  Coralyn Helling,  MD [facility] Pulmonary/Critical Care 12/17/2012, 8:31 AM Pager:  [phone] After 3pm call: [phone]

## 2012-12-17 NOTE — Evaluation (Signed)
Physical Therapy Evaluation Patient Details Name: Victor Little MRN: [id number] DOB: 10/07/1945 Today's Date: 12/17/2012 Time: 7829-5621 PT Time Calculation (min): 15 min  PT Assessment / Plan / Recommendation History of Present Illness  67 yo male from residential group home with C diff colitis, possible SBO vs ileus and sepsis.  Hx of mental retardation, deaf, mute. 11/22 Aspiration, VDRF, CCS consulted, PCCM assumed care and pt extubated 12/1  Clinical Impression  Pt admitted with above from Group Home. Pt currently with functional limitations due to the deficits listed below (see PT Problem List).  Pt will benefit from skilled PT to increase their independence and safety with mobility to allow discharge to the venue listed below.  Pt presents with generalized weakness and decreased mobility at this time.       PT Assessment  Patient needs continued PT services    Follow Up Recommendations  SNF    Does the patient have the potential to tolerate intense rehabilitation      Barriers to Discharge        Equipment Recommendations  Rolling walker with 5" wheels;Wheelchair (measurements PT)    Recommendations for Other Services     Frequency Min 3X/week    Precautions / Restrictions Precautions Precautions: Fall Precaution Comments: multiple lines/leads, NG tube   Pertinent Vitals/Pain No signs of discomfort VSS      Mobility  Bed Mobility Bed Mobility: Supine to Sit Supine to Sit: 1: +2 Total assist Supine to Sit: Patient Percentage: 10% Details for Bed Mobility Assistance: visual cues and hand motions for mobilizing Transfers Transfers: Sit to Stand;Stand to Sit;Stand Pivot Transfers Sit to Stand: 1: +2 Total assist;From bed Sit to Stand: Patient Percentage: 30% Stand to Sit: To bed;1: +2 Total assist;To chair/3-in-1 Stand to Sit: Patient Percentage: 30% Stand Pivot Transfers: 1: +2 Total assist Stand Pivot Transfers: Patient Percentage: 30% Details for  Transfer Assistance: unable to achieve upright posture upon first attempt as well as knees buckled so returned to sitting and then performed squat pivot to recliner Ambulation/Gait Ambulation/Gait Assistance: Not tested (comment)    Exercises     PT Diagnosis: Difficulty walking;Generalized weakness  PT Problem List: Decreased strength;Decreased activity tolerance;Decreased mobility;Decreased balance;Decreased knowledge of use of DME;Decreased safety awareness PT Treatment Interventions: DME instruction;Gait training;Functional mobility training;Therapeutic activities;Therapeutic exercise;Patient/family education;Balance training     PT Goals(Current goals can be found in the care plan section) Acute Rehab PT Goals PT Goal Formulation: Patient unable to participate in goal setting Time For Goal Achievement: 12/31/12 Potential to Achieve Goals: Good  Visit Information  Last PT Received On: 12/17/12 Assistance Needed: +2 History of Present Illness: 67 yo male from residential group home with C diff colitis, possible SBO vs ileus and sepsis.  Hx of mental retardation, deaf, mute. 11/22 Aspiration, VDRF, CCS consulted, PCCM assumed care and pt extubated 12/1       Prior Functioning  Home Living Family/patient expects to be discharged to:: Group home Prior Function Level of Independence: Independent Comments: per his RN today, pt independent at Group Home and even assisted in care for other residents Communication Communication: Deaf    Cognition  Cognition Arousal/Alertness: Awake/alert Overall Cognitive Status: Impaired/Different from baseline General Comments: per RN, cognition not yet at baseline however improved since off vent    Extremity/Trunk Assessment Lower Extremity Assessment Lower Extremity Assessment: Generalized weakness;RLE deficits/detail;LLE deficits/detail RLE Deficits / Details: PROM intact, pt not following visual cues to assess active, knee buckling upon  standing observed LLE Deficits /  Details: PROM intact, pt not following visual cues to assess active, knee buckling upon standing observed   Balance Balance Balance Assessed: Yes Static Sitting Balance Static Sitting - Balance Support: Bilateral upper extremity supported;Feet supported Static Sitting - Level of Assistance: 4: Min assist Static Sitting - Comment/# of Minutes: sat EOB approx 4 min prior to transfer, able to improve to min/guard  End of Session PT - End of Session Equipment Utilized During Treatment: Oxygen Activity Tolerance: Patient tolerated treatment well Patient left: in chair;with call bell/phone within reach Nurse Communication: Mobility status;Need for lift equipment  GP     Hayzen Lorenson,KATHrine E 12/17/2012, 1:29 PM Zenovia Jarred, PT, DPT 12/17/2012 Pager: [phone]

## 2012-12-17 NOTE — Progress Notes (Signed)
Subjective: He is extubated and sitting up in chair.  He does not respond to any questions.  No grimace or indication of discomfort with exam.  Objective: Vital signs in last 24 hours: Temp:  [98.8 F (37.1 C)-99.7 F (37.6 C)] 99.7 F (37.6 C) (12/02 0800) Pulse Rate:  [65-86] 65 (12/02 0800) Resp:  [12-28] 18 (12/02 0800) BP: (96-144)/(57-79) 116/74 mmHg (12/02 0800) SpO2:  [91 %-100 %] 95 % (12/02 0800) Weight:  [78.7 kg (173 lb 8 oz)] 78.7 kg (173 lb 8 oz) (12/02 0500) Last BM Date: 12/15/12 1150 from NG Down to 1 stool yesterday Tm 99.7 BMP this AM is OK Intake/Output from previous day: 12/01 0701 - 12/02 0700 In: 3605 [I.V.:120; NG/GT:100; IV Piggyback:920; TPN:2465] Out: 4960 [Urine:3810; Emesis/NG output:1150] Intake/Output this shift:    General appearance: alert, cooperative and no distress GI: soft, non-tender; bowel sounds normal; no masses,  no organomegaly  Lab Results:   Recent Labs  12/15/12 0520 12/16/12 0628  WBC 16.3* 16.6*  HGB 12.8* 12.7*  HCT 37.1* 36.5*  PLT 356 405*    BMET  Recent Labs  12/16/12 0628 12/17/12 0400  NA 131* 132*  K 4.4 3.7  CL 97 97  CO2 25 25  GLUCOSE 115* 114*  BUN 27* 27*  CREATININE 0.98 1.05  CALCIUM 7.9* 7.9*   PT/INR No results found for this basename: LABPROT, INR,  in the last 72 hours   Recent Labs Lab 12/13/12 0530 12/16/12 0628  AST 17 30  ALT 11 14  ALKPHOS 59 71  BILITOT 0.5 0.5  PROT 5.3* 6.4  ALBUMIN 1.9* 2.2*     Lipase     Component Value Date/Time   LIPASE 48 12/05/2012 0747     Studies/Results: Ct Abdomen Pelvis W Contrast  12/15/2012   CLINICAL DATA:  Followup pneumoperitoneum. Evaluate inflammation the lower abdomen. Resolving C diff colitis.  EXAM: CT ABDOMEN AND PELVIS WITH CONTRAST  TECHNIQUE: Multidetector CT imaging of the abdomen and pelvis was performed using the standard protocol following bolus administration of intravenous contrast.  CONTRAST:  OMNIPAQUE  IOHEXOL 300 MG/ML SOLN, 50mL OMNIPAQUE IOHEXOL 300 MG/ML SOLN  COMPARISON:  CT abdomen and pelvis 12/11/2012 and 12/05/2012  FINDINGS: Lung bases: There persistent bilateral of pleural effusions. The left pleural effusion appears slightly smaller on today's examination compared to 08/10/2012. The right pleural effusion is unchanged. Nodular airspace opacities in the right middle lobe and right lower lobe persistent consistent with pneumonia. There is bilateral lower lobe atelectasis.  A small pericardial effusion is present.  Heart size appears normal.  Abdomen/pelvis: A 1.9 x 1.4 cm ill-defined low-density lesion in the left lobe of the liver is unchanged. Possible subtle 8 mm low-density lesion in the right lobe the liver on image number 29 that was not present are visible on recent prior studies. May be due to differences in timing of the scan relative to contrast administration. 5 mm circumscribed low-density lesion near the dome the liver was also not well visualized on recent prior study.  The volume of pneumoperitoneum appear similar to slightly decreased compared to the study of 12/11/2012.  Oral contrast has progressed to the level of the distal descending colon at the time of the exam. And nasogastric tube terminates in the distal stomach. The stomach is unremarkable.  Distal colonic and rectal wall discuss that distal sigmoid and rectal wall thickening persists but this appear to have decreased since the previous to CT examinations. Any long date area  of fluid density with peripheral enhancement along the left side of the rectum is unchanged and measures 1.7 x 1.7 x 2.7 cm. Reactive size lymph nodes are seen adjacent to the rectum. There is also stranding in the presacral fat, unchanged.  Small bowel loops in the left abdomen remain mildly dilated, but appear slightly decreased in caliber compared to the study from 12/05/2012. No discrete small bowel wall thickening is identified. A discrete transition  point is not seen.  Slightly cephalad and anterior to the proximal sigmoid colon, on image number 72 is a small rim enhancing fluid collection in the anterior left abdomen measuring approximately 3.7 by 1.6 cm axial dimension. This fluid collection is new and suspicious for a small abscess. No significant volume of ascites is identified.  The spleen, adrenal glands, pancreas, and kidneys are within normal limits. Left retro aortic renal vein noted. Abdominal aorta normal in caliber.  The urinary bladder contains a Foley catheter. There is a moderate amount of air within the urinary bladder, likely related to Foley catheter presents. Prostate gland contains central calcification and is borderline enlarged.  Left inguinal hernia contains fat only.  Extensive degenerative changes of the spine. No acute or suspicious bony abnormality.  The liver is normal in size.  IMPRESSION: 1. Moderate volume of pneumoperitoneum appears similar to slightly decreased compared to CT of 12/11/2012. 2. Development of small focal abscess in the anterior left lower quadrant of the abdomen, measuring 3.7 x 1.6 cm axial dimension. 3. Decreasing distal colonic and rectal wall thickening suggests improving C diff colitis. Small bowel dilatation is slightly improved. A low grade partial small bowel obstruction is not excluded. 4. No change in oblong mass or fluid collection adjacent to the left aspect of the proximal rectum. This has central fluid density and peripheral enhancement and could reflect a small perirectal abscess or possibly lymphadenopathy with central necrosis. 5. Right middle lobe and right lower lobe nodular airspace opacities most consistent with pneumonia. Bilateral pleural effusions with overlying atelectasis. 6. Small pericardial effusion. 7. Foley catheter in place with associated air in the urinary bladder. 8. Several small low-density lesions in the liver. Suspicious mass lesion, particularly in the left lobe cannot be  excluded. When the patient has current illness resolved, consider followup abdominal MRI with and without contrast when the patient can tolerate breath holding.   Electronically Signed   By: Britta Mccreedy M.D.   On: 12/15/2012 12:20   Dg Chest Port 1 View  12/17/2012   CLINICAL DATA:  Pneumonia, pleural effusion  EXAM: PORTABLE CHEST - 1 VIEW  COMPARISON:  Portable exam 0507 hr compared to 12/15/2012  FINDINGS: Nasogastric tube extends into stomach.  Left jugular central venous catheter tip projects over left brachiocephalic vein near SVC confluence.  Stable heart size and mediastinal contours.  Right basilar atelectasis.  Persistent accentuation of interstitial markings bilaterally, greater on right.  Decreased pneumoperitoneum since previous exam, noted previously.  No pneumothorax.  IMPRESSION: Decreased pneumoperitoneum.  Right basilar atelectasis.  Suspect mild diffuse bilateral interstitial infiltrates, right greater than left, question edema versus infection.  a   Electronically Signed   By: Ulyses Southward M.D.   On: 12/17/2012 08:13    Medications: . antiseptic oral rinse  1 application Mouth Rinse QID  . chlorhexidine  15 mL Mouth/Throat BID  . famotidine (PEPCID) IV  20 mg Intravenous Q24H  . heparin subcutaneous  5,000 Units Subcutaneous Q8H  . insulin aspart  0-9 Units Subcutaneous Q8H  .  metronidazole  500 mg Intravenous Q8H  . micafungin (MYCAMINE) IV  100 mg Intravenous Q24H  . piperacillin-tazobactam (ZOSYN)  IV  3.375 g Intravenous Q8H  . potassium chloride  10 mEq Intravenous Q2H  . vancomycin  500 mg Per Tube Q6H   Scheduled Meds: . antiseptic oral rinse  1 application Mouth Rinse QID  . chlorhexidine  15 mL Mouth/Throat BID  . famotidine (PEPCID) IV  20 mg Intravenous Q24H  . heparin subcutaneous  5,000 Units Subcutaneous Q8H  . insulin aspart  0-9 Units Subcutaneous Q8H  . metronidazole  500 mg Intravenous Q8H  . micafungin (MYCAMINE) IV  100 mg Intravenous Q24H  .  piperacillin-tazobactam (ZOSYN)  IV  3.375 g Intravenous Q8H  . potassium chloride  10 mEq Intravenous Q2H  . vancomycin  500 mg Per Tube Q6H   Continuous Infusions: . Marland KitchenTPN (CLINIMIX-E) Adult 95 mL/hr at 12/16/12 1815   And  . fat emulsion    . Marland KitchenTPN (CLINIMIX-E) Adult     And  . fat emulsion     PRN Meds:.acetaminophen, albuterol, fentaNYL, metoprolol, ondansetron (ZOFRAN) IV, sodium chloride   Assessment/Plan Cdiff Colitis with pneumoperitoneum SBO vs ileus Acute respiratory failure/aspiration pneumonia Resolved acute renal failure Encephalopathy with history of mental retardation/deaf/'mute   Plan: i will discuss clamping trials and repeat CT scan with Dr.Martin.  His exam does not show any tenderness at all.  Nothing to make me think he has an abscess or other abdominal pain. He is extubated and up in chair with tube clamped now.        LOS: 12 days    Victor Little 12/17/2012

## 2012-12-18 ENCOUNTER — Inpatient Hospital Stay (HOSPITAL_COMMUNITY): Payer: PRIVATE HEALTH INSURANCE

## 2012-12-18 LAB — BASIC METABOLIC PANEL
BUN: 27 mg/dL — ABNORMAL HIGH (ref 6–23)
Calcium: 7.9 mg/dL — ABNORMAL LOW (ref 8.4–10.5)
Chloride: 99 mEq/L (ref 96–112)
GFR calc Af Amer: 89 mL/min — ABNORMAL LOW (ref >90)
GFR calc non Af Amer: 76 mL/min — ABNORMAL LOW (ref >90)
Glucose, Bld: 130 mg/dL — ABNORMAL HIGH (ref 70–99)
Sodium: 131 mEq/L — ABNORMAL LOW (ref 135–145)

## 2012-12-18 LAB — CBC
HCT: 36.4 % — ABNORMAL LOW (ref 39.0–52.0)
Hemoglobin: 12.4 g/dL — ABNORMAL LOW (ref 13.0–17.0)
MCH: 28.7 pg (ref 26.0–34.0)
MCV: 84.3 fL (ref 78.0–100.0)
Platelets: 529 10*3/uL — ABNORMAL HIGH (ref 150–400)
RDW: 13.8 % (ref 11.5–15.5)

## 2012-12-18 LAB — GLUCOSE, CAPILLARY: Glucose-Capillary: 119 mg/dL — ABNORMAL HIGH (ref 70–99)

## 2012-12-18 MED ORDER — SODIUM CHLORIDE 0.9 % IJ SOLN
10.0000 mL | INTRAMUSCULAR | Status: DC | PRN
  Administered 2012-12-20 – 2012-12-27 (×9): 10 mL

## 2012-12-18 MED ORDER — INFLUENZA VAC SPLIT QUAD 0.25 ML IM SUSP: 0.2500 mL | INTRAMUSCULAR | Status: DC

## 2012-12-18 MED ORDER — TRACE MINERALS CR-CU-F-FE-I-MN-MO-SE-ZN IV SOLN
INTRAVENOUS | Status: AC
  Administered 2012-12-18: 18:00:00 via INTRAVENOUS
  Filled 2012-12-18: qty 2280

## 2012-12-18 MED ORDER — FAT EMULSION 20 % IV EMUL
250.0000 mL | INTRAVENOUS | Status: AC
  Administered 2012-12-18: 250 mL via INTRAVENOUS
  Filled 2012-12-18: qty 250

## 2012-12-18 NOTE — Progress Notes (Signed)
ANTIBIOTIC CONSULT NOTE - Follow up  Pharmacy Consult for Zosyn Indication: aspiration PNA, ?abdominal abscess  No Known Allergies  Patient Measurements: Height: 6' (182.9 cm) Weight: 175 lb 0.7 oz (79.4 kg) IBW/kg (Calculated) : 77.6  Vital Signs: Temp: 98.3 F (36.8 C) (12/03 0400) Temp src: Oral (12/03 0400) BP: 130/70 mmHg (12/03 0600) Pulse Rate: 81 (12/03 0600) Intake/Output from previous day: 12/02 0701 - 12/03 0700 In: 3400 [IV Piggyback:1090; TPN:2310] Out: 1775 [Urine:925; Emesis/NG output:850] Intake/Output from this shift:    Labs:  Recent Labs  12/16/12 0628 12/17/12 0400 12/18/12 0300  WBC 16.6*  --   --   HGB 12.7*  --   --   PLT 405*  --   --   CREATININE 0.98 1.05 1.00   Estimated Creatinine Clearance: 79.8 ml/min (by C-G formula based on Cr of 1).  Recent Labs  12/15/12 1200  VANCOTROUGH 18.0      Assessment: 67 yo with hx HTN, mental retardation, deafness admitted 11/20 from group home with 12 hours of vomiting and diarrhea and initial BP of 81/50. Patient also with elevated lactic acid and procalcitonin on admission. Pharmacy was consulted to dose broad spectrum abx's for possible sepsis/aspiration pna. Pt also C.diff positive.   Today is D14 Zosyn  / D8 Micafungin 100mg  IV q 24 h for sepsis/asp PNA/pneumoperitoneum/?intraobd infx.  D14/14 Vanc/Flagyl for Cdiff  SCr remains stable  WBC remain elevated on 12/1  Antiinfectives: 11/20 >> Vanc IV >> 11/20 11/20 >> Vanc PR >> 11/26 11/20 >> Flagyl (Cdiff) >> 12/3 11/24 >> Vanc VT (Cdiff) >> 12/3 11/20 >> Zosyn (PNA) >> 11/25 >> Vanc IV (PNA) >> 12/1 11/26 >> Micafungin (abd) >>   Microbiology: 11/20 blood: NG F 11/20 urine: NG F  11/20 CDiff PCR: (+) 11/20 MRSA PCR: (-) 11/25 blood: NG F  Goal of Therapy:  Eradication of infection Dose per renal function  Plan:   Continue Zosyn 3.375g IV q8h, each dose infused over 4 hours  Follow-up clinical course, culture results, renal  function and length of therapy  Loralee Pacas, PharmD, BCPS Pager: [phone] 12/18/2012 7:52 AM

## 2012-12-18 NOTE — Progress Notes (Signed)
Dr. Rito Ehrlich gave verbal order for the consent of the pts PICC line to be medically necessary. Pts sister who is POA is not involved in the patients care and therefore has no next of kin to make decisions. MD said that he was okay with it and consent filled out with IV team nurse.

## 2012-12-18 NOTE — Progress Notes (Signed)
Peripherally Inserted Central Catheter/Midline Placement  The IV Nurse has discussed with the patient and/or persons authorized to consent for the patient, the purpose of this procedure and the potential benefits and risks involved with this procedure.  The benefits include less needle sticks, lab draws from the catheter and patient may be discharged home with the catheter.  Risks include, but not limited to, infection, bleeding, blood clot (thrombus formation), and puncture of an artery; nerve damage and irregular heat beat.  Alternatives to this procedure were also discussed.  PICC/Midline Placement Documentation        Lisabeth Devoid 12/18/2012, 5:20 PM Consent was obtained by ICU staff from Dr. Rito Ehrlich as a medical necessity for PICC insertion.

## 2012-12-18 NOTE — Progress Notes (Signed)
NUTRITION FOLLOW UP  Intervention:   - TPN per pharmacy - Recommend advancing diet to clear liquids as soon as possible - Will continue to monitor   Nutrition Dx:   Inadequate oral intake related to altered GI function as evidenced by npo status; ongoing   Goal:   TPN to meet >90% of estimated nutritional needs - met   Monitor:   Weights; 15 lbs below admission weight TPN; at goal and meeting estimated nutritional needs Labs; low sodium, elevated BUN, low calcium  Assessment:   67 years old male resident of a nursing home with PMH relevant for mental retardation, deafness, HTN. Admid cdiff, aspiration, vent.  11/25 - Per RN pt is being weaned off of propofol; unable to wean pt off vent yesterday but will re-attempt to wean again today. Per RN pt receiving Vanc enemas QID with minimal stool output (smear BM today) and NG tube has put out 1200 ml since admission. Per MD surgery note, bowel sounds are normal, x-ray is consistent with diffuse ileus, no evidence of obstruction.   11/26 - Extubated considered if pt becomes more awake. Found to have C. Difficile with diarrhea like stool. RD consulted for TF initiation/management. Weight up 20 pounds since admit. Per conversation with RN, TF not started due to CT showing free air in abdomen with concern for pneumoperitoneum.   11/28 - TPN started yesterday r/t pneumoperitoneum on CT, probable ruptured viscus, ileus vs partial SBO, colitis with probable GI perforation per pharmD notes.   12/1 - Pt extubated. Pt with C. Difficile. Plan per CCS: observe x 24 hr, re-evaluate for need for laparotomy, unless clinical picture worsens. Tube feed not begun d/t suspected GI perf, continue TPN.   12/3: Current TPN: Clinimix E 5/15 of 68ml/hr + 20% lipids at 40ml/hr to deliver 114g protein and 2099 KCal per 24 hr which meets 100% of estimated energy and 109% of estimated protein needs. Sterile procedure in progress in pt room at time of visit. Per nursing  notes pt had a BM on 12/1 and on 12/2.  Pt's weight has dropped 15 lbs from his admission weight of 182 lbs (8.3% weight loss). Per chart NG tube remains in place for now due to significant NG drainage.    CBG (last 3)   Recent Labs  12/17/12 2207 12/18/12 1227 12/18/12 1516  GLUCAP 101* 116* 119*     Height: Ht Readings from Last 1 Encounters:  12/07/12 6' (1.829 m)    Weight Status:   Wt Readings from Last 1 Encounters:  12/18/12 167 lb 15.9 oz (76.2 kg)  Admit wt:        182 lb 5 oz (82.6 kg)     Re-estimated needs:  Kcal: 2100-2300 Protein: 90-105 grams Fluid: 2.1-2.3 L/day  Skin: Non-pitting RUE, LUE, RLE, LLE edema  Diet Order: NPO   Intake/Output Summary (Last 24 hours) at 12/18/12 1553 Last data filed at 12/18/12 1034  Gross per 24 hour  Intake   2310 ml  Output   1780 ml  Net    530 ml    Last BM: 12/2   Labs:   Recent Labs Lab 12/12/12 0400 12/13/12 0530  12/16/12 0628 12/17/12 0400 12/18/12 0300  NA 141 142  < > 131* 132* 131*  K 3.1* 3.4*  < > 4.4 3.7 4.0  CL 108 111  < > 97 97 99  CO2 21 23  < > 25 25 23   BUN 19 20  < > 27*  27* 27*  CREATININE 0.99 1.00  < > 0.98 1.05 1.00  CALCIUM 7.4* 7.4*  < > 7.9* 7.9* 7.9*  MG 1.8 2.1  --  2.1  --   --   PHOS 3.0 2.9  --  3.4  --   --   GLUCOSE 99 134*  < > 115* 114* 130*  < > = values in this interval not displayed.  CBG (last 3)   Recent Labs  12/17/12 2207 12/18/12 1227 12/18/12 1516  GLUCAP 101* 116* 119*    Scheduled Meds: . antiseptic oral rinse  1 application Mouth Rinse QID  . chlorhexidine  15 mL Mouth/Throat BID  . famotidine (PEPCID) IV  20 mg Intravenous Q24H  . heparin subcutaneous  5,000 Units Subcutaneous Q8H  . insulin aspart  0-9 Units Subcutaneous Q8H  . metronidazole  500 mg Intravenous Q8H  . micafungin (MYCAMINE) IV  100 mg Intravenous Q24H  . piperacillin-tazobactam (ZOSYN)  IV  3.375 g Intravenous Q8H  . vancomycin  500 mg Per Tube Q6H    Continuous  Infusions: . Marland KitchenTPN (CLINIMIX-E) Adult 95 mL/hr at 12/17/12 1727   And  . fat emulsion 250 mL (12/17/12 1727)  . Marland KitchenTPN (CLINIMIX-E) Adult     And  . fat emulsion      Ian Malkin RD, LDN Inpatient Clinical Dietitian Pager: [phone] After Hours Pager: [phone]

## 2012-12-18 NOTE — Progress Notes (Signed)
Subjective: He remains non verbal.  He has allot of discomfort with manipulation of his right wrist.  His abdomen is soft, non tender, with BS.  No significant distension. He feels warm like he has a fever.  Oral temps are normal.  Objective: Vital signs in last 24 hours: Temp:  [97.9 F (36.6 C)-99.8 F (37.7 C)] 98.3 F (36.8 C) (12/03 0400) Pulse Rate:  [65-84] 81 (12/03 0600) Resp:  [18-29] 25 (12/03 0600) BP: (80-155)/(50-87) 130/70 mmHg (12/03 0600) SpO2:  [95 %-100 %] 96 % (12/03 0600) Weight:  [79.4 kg (175 lb 0.7 oz)] 79.4 kg (175 lb 0.7 oz) (12/03 0300) Last BM Date: 12/15/12 850 from NG 1BM yesterday Afebrile VSS, RR still up Mid to upper 20 range.  Intake/Output from previous day: 12/02 0701 - 12/03 0700 In: 3400 [IV Piggyback:1090; TPN:2310] Out: 1775 [Urine:925; Emesis/NG output:850] Intake/Output this shift:    General appearance: alert, cooperative, no distress and he has pain and it is obvious with bending his right wrist. GI: soft, non-tender; bowel sounds normal; no masses,  no organomegaly  Lab Results:   Recent Labs  12/16/12 0628  WBC 16.6*  HGB 12.7*  HCT 36.5*  PLT 405*    BMET  Recent Labs  12/17/12 0400 12/18/12 0300  NA 132* 131*  K 3.7 4.0  CL 97 99  CO2 25 23  GLUCOSE 114* 130*  BUN 27* 27*  CREATININE 1.05 1.00  CALCIUM 7.9* 7.9*   PT/INR No results found for this basename: LABPROT, INR,  in the last 72 hours   Recent Labs Lab 12/13/12 0530 12/16/12 0628  AST 17 30  ALT 11 14  ALKPHOS 59 71  BILITOT 0.5 0.5  PROT 5.3* 6.4  ALBUMIN 1.9* 2.2*     Lipase     Component Value Date/Time   LIPASE 48 12/05/2012 0747     Studies/Results: Dg Chest Port 1 View  12/17/2012   CLINICAL DATA:  Pneumonia, pleural effusion  EXAM: PORTABLE CHEST - 1 VIEW  COMPARISON:  Portable exam 0507 hr compared to 12/15/2012  FINDINGS: Nasogastric tube extends into stomach.  Left jugular central venous catheter tip projects over left  brachiocephalic vein near SVC confluence.  Stable heart size and mediastinal contours.  Right basilar atelectasis.  Persistent accentuation of interstitial markings bilaterally, greater on right.  Decreased pneumoperitoneum since previous exam, noted previously.  No pneumothorax.  IMPRESSION: Decreased pneumoperitoneum.  Right basilar atelectasis.  Suspect mild diffuse bilateral interstitial infiltrates, right greater than left, question edema versus infection.  a   Electronically Signed   By: Ulyses Southward M.D.   On: 12/17/2012 08:13    Medications: . antiseptic oral rinse  1 application Mouth Rinse QID  . chlorhexidine  15 mL Mouth/Throat BID  . famotidine (PEPCID) IV  20 mg Intravenous Q24H  . heparin subcutaneous  5,000 Units Subcutaneous Q8H  . insulin aspart  0-9 Units Subcutaneous Q8H  . metronidazole  500 mg Intravenous Q8H  . micafungin (MYCAMINE) IV  100 mg Intravenous Q24H  . piperacillin-tazobactam (ZOSYN)  IV  3.375 g Intravenous Q8H  . vancomycin  500 mg Per Tube Q6H    Assessment/Plan Cdiff Colitis with pneumoperitoneum  SBO vs ileus  Acute respiratory failure/aspiration pneumonia  Resolved acute renal failure  Encephalopathy with history of mental retardation/deaf/'mute  Right wrist pain    Plan:  From our standpoint he is stable.  Dr Daphine Deutscher is going to watch him for now. Stools are improving.  He still  has allot of NG drainage and I would leave NG in for now. I will get some films today and add right wrist to it.   LOS: 13 days    Fayola Meckes 12/18/2012

## 2012-12-18 NOTE — Progress Notes (Signed)
Physical Therapy Treatment Patient Details Name: Victor Little MRN: [id number] DOB: [birth year] Today's Date: 12/18/2012 Time: 1478-2956 PT Time Calculation (min): 15 min  PT Assessment / Plan / Recommendation  History of Present Illness 67 yo male from residential group home with C diff colitis, possible SBO vs ileus and sepsis.  Hx of mental retardation, deaf, mute. 11/22 Aspiration, VDRF, CCS consulted, PCCM assumed care and pt extubated 12/1   PT Comments   Pt with increased facial grimacing and appeared distressed with sitting EOB so returned to supine. RN notified.  Follow Up Recommendations  SNF     Does the patient have the potential to tolerate intense rehabilitation     Barriers to Discharge        Equipment Recommendations  Rolling walker with 5" wheels;Wheelchair (measurements PT)    Recommendations for Other Services    Frequency Min 3X/week   Progress towards PT Goals Progress towards PT goals: Not progressing toward goals - comment (pt with more facial grimacing today possibly due to incr pain)  Plan Current plan remains appropriate    Precautions / Restrictions Precautions Precautions: Fall Precaution Comments: multiple lines/leads, NG tube   Pertinent Vitals/Pain See below, RN notified    Mobility  Bed Mobility Bed Mobility: Supine to Sit;Sit to Supine Supine to Sit: 1: +2 Total assist Supine to Sit: Patient Percentage: 0% Sit to Supine: 1: +2 Total assist Sit to Supine: Patient Percentage: 0% Details for Bed Mobility Assistance: pt did not attempt to assist today, grimacing with movement esp touching R UE (xray neg for wrist), returned pt to supine Transfers Transfers: Not assessed (due to increased grimacing with bed mobility)    Exercises     PT Diagnosis:    PT Problem List:   PT Treatment Interventions:     PT Goals (current goals can now be found in the care plan section)    Visit Information  Last PT Received On: 12/18/12 Assistance  Needed: +2 History of Present Illness: 67 yo male from residential group home with C diff colitis, possible SBO vs ileus and sepsis.  Hx of mental retardation, deaf, mute. 11/22 Aspiration, VDRF, CCS consulted, PCCM assumed care and pt extubated 12/1    Subjective Data      Cognition  Cognition Arousal/Alertness: Lethargic Behavior During Therapy: Flat affect Overall Cognitive Status: Impaired/Different from baseline General Comments: pt overall appears more lethargic today and grimacing more with mobility    Balance  Static Sitting Balance Static Sitting - Balance Support: Feet supported;Bilateral upper extremity supported Static Sitting - Level of Assistance: 1: +1 Total assist Static Sitting - Comment/# of Minutes: pt unable to tolerate sitting EOB today, appeared distressed and had increased facial grimacing so returned to supine position  End of Session PT - End of Session Activity Tolerance: Patient limited by pain Patient left: in bed;with bed alarm set;with call bell/phone within reach Nurse Communication: Mobility status (grimacing with movement so returned to supine)   GP     Chemeka Filice,Victor Little 12/18/2012, 3:27 PM Victor Little, PT, DPT 12/18/2012 Pager: [phone]

## 2012-12-18 NOTE — Progress Notes (Addendum)
[facility] PROGRESS NOTE  TRAYVEN LUMADUE ZOX:[id number] DOB: 30-Nov-1945 DOA: 12/05/2012 PCP: Pcp Not In System  Interim summary:  Patient is a 67 year old white male with past medical history of deafness, mental retardation from a group home who presented on 11/20 to the emergency room after multiple episodes of diarrhea. Patient was noted to have a a markedly elevated white blood cell count, acute renal failure and signs consistent with septic shock likely from C. difficile colitis. He was also noted to have a small bowel obstruction an NG tube was placed. Cultures came back soon after positive for C. difficile and she was admitted to the ICU started on IV Flagyl and by mouth vancomycin enemas.   By 11/22, patient had a vomiting episode which led to aspiration and secondary respiratory failure and intubation. At this point, critical care became the primary attending. Patient was continued on antibiotics plus respirator. And then on 11/25, and abdominal x-ray following a small bowel obstruction also noted air-fluid levels. General surgery was consulted and a CT of the abdomen and pelvis noted pneumoperitoneum. Patient was watched closely and vital signs and lab work all looks to be improving with a benign abdominal exam and was felt likely patient had a perforation that had sealed in the past. Followup CT of abdomen and pelvis done 11/30 noted increased bilateral pleural effusions with present right middle and lower lobe pneumonia and decreased pneumoperitoneum. Patient is also noted to have 3.7 cm abscess in the left lower quadrant.  On 12/1, patient was able to be extubated and weaned down to minimal oxygen. His acute renal failure which is felt to be secondary to septic shock and hypotension had since resolved as well. His white blood cell count has continued to improve. He was transferred to the hospitalist service after 12/2 Because of prolonged ventilator support and current n.p.o. status  given bowel issues, TPN has been recommended. On 12/3, hospitalist attending has ordered a PICC line and pharmacy for TPN. Surgery this point plans to monitor patient in regards to abscess given his overall improvement. He saw significant NG drainage to NG tube was recommended to be left in for now. Nursing also noted some guarding of the right wrist and pain with movement so x-rays have been ordered.   Assessment/Plan: Principal Problem:   Septic shock: Resolved Active Problems:   Diarrhea: Secondary C. difficile, resolved   Organic brain syndrome (chronic)   Deaf   ARF (acute renal failure): Felt to be secondary to hypotension from septic shock. Result.   Secondary hypotension: Resolved, secondary to septic shock    SBO (small bowel obstruction): Continue NG tube, slowly getting better   HTN (hypertension): Stable.   Neuropathy   Clostridium difficile colitis: Continue IV antibiotics.  Aspiration pneumonia: Followup chest x-ray looks more like edema rather than pneumonia. Checking BNP. We'll plan to discontinue Zosyn once white count normalized    Acute respiratory failure with hypoxia: Resolved, secondary to aspiration pneumonia  Left lower quadrant abscess: Continue to observe as per surgery  Pneumoperitoneum: Looks to be self improved   Code Status: Full code Family Communication: Have left message with contact from group home Disposition Plan: Transfer to floor, hopefully start by mouth soon   Consultants:  Critical Care  General surgery  Procedures:  Intubation the patient 11/22-12/1  Placement of left IJ central line 11/22  Antibiotics: Flagyl 11/20 >>  Zosyn 11/20 >>  Enteral Vancomycin 11/20 >>  IV Vancomycin 11/24 >> 12/01  Micafungin 11/26 >>  HPI/Subjective: Patient unable to really give me any history or complaints given history of MR, deafness and mute  Objective: Filed Vitals:   12/18/12 0800  BP: 129/71  Pulse: 78  Temp:   Resp: 26     Intake/Output Summary (Last 24 hours) at 12/18/12 1126 Last data filed at 12/18/12 1034  Gross per 24 hour  Intake   2960 ml  Output   2080 ml  Net    880 ml   Filed Weights   12/16/12 0500 12/17/12 0500 12/18/12 0300  Weight: 79.7 kg (175 lb 11.3 oz) 78.7 kg (173 lb 8 oz) 79.4 kg (175 lb 0.7 oz)    Exam:   General:  Appears in no acute distress, patient has chronic mental retardation  Cardiovascular: Regular rate and rhythm, S1-S2  Respiratory: Clear to auscultation bilaterally, limited inspiration  Abdomen: Soft,? non-tender, positive bowel sounds, nondistended  Musculoskeletal: No clubbing or cyanosis or edema  Data Reviewed: Basic Metabolic Panel:  Recent Labs Lab 12/12/12 0400 12/13/12 0530 12/14/12 0520 12/15/12 0520 12/16/12 0628 12/17/12 0400 12/18/12 0300  NA 141 142 139 138 131* 132* 131*  K 3.1* 3.4* 3.3* 3.3* 4.4 3.7 4.0  CL 108 111 100 98 97 97 99  CO2 21 23 27 27 25 25 23   GLUCOSE 99 134* 119* 123* 115* 114* 130*  BUN 19 20 20  26* 27* 27* 27*  CREATININE 0.99 1.00 1.11 1.14 0.98 1.05 1.00  CALCIUM 7.4* 7.4* 7.6* 7.7* 7.9* 7.9* 7.9*  MG 1.8 2.1  --   --  2.1  --   --   PHOS 3.0 2.9  --   --  3.4  --   --    Liver Function Tests:  Recent Labs Lab 12/13/12 0530 12/16/12 0628  AST 17 30  ALT 11 14  ALKPHOS 59 71  BILITOT 0.5 0.5  PROT 5.3* 6.4  ALBUMIN 1.9* 2.2*   No results found for this basename: LIPASE, AMYLASE,  in the last 168 hours No results found for this basename: AMMONIA,  in the last 168 hours CBC:  Recent Labs Lab 12/12/12 0400 12/13/12 0530 12/15/12 0520 12/16/12 0628 12/18/12 0800  WBC 15.2* 15.4* 16.3* 16.6* 12.7*  NEUTROABS 12.2* 12.6*  --  13.0*  --   HGB 12.2* 11.3* 12.8* 12.7* 12.4*  HCT 36.3* 33.1* 37.1* 36.5* 36.4*  MCV 84.2 84.2 83.7 83.1 84.3  PLT 327 281 356 405* 529*   Cardiac Enzymes: No results found for this basename: CKTOTAL, CKMB, CKMBINDEX, TROPONINI,  in the last 168 hours BNP (last 3  results)  Recent Labs  12/07/12 0335  PROBNP 680.8*   CBG:  Recent Labs Lab 12/16/12 1927 12/16/12 2329 12/17/12 0253 12/17/12 1531 12/17/12 2207  GLUCAP 120* 113* 126* 107* 101*    Recent Results (from the past 240 hour(s))  CULTURE, BLOOD (ROUTINE X 2)     Status: None   Collection Time    12/10/12 10:40 AM      Result Value Range Status   Specimen Description BLOOD LEFT ARM   Final   Special Requests BOTTLES DRAWN AEROBIC AND ANAEROBIC 2CC   Final   Culture  Setup Time     Final   Value: 12/10/2012 12:57     Performed at [facility]   Culture     Final   Value: NO GROWTH 5 DAYS     Performed at [facility]   Report Status 12/16/2012 FINAL   Final  CULTURE,  BLOOD (ROUTINE X 2)     Status: None   Collection Time    12/10/12 10:40 AM      Result Value Range Status   Specimen Description BLOOD RIGHT ARM   Final   Special Requests BOTTLES DRAWN AEROBIC AND ANAEROBIC 5CC   Final   Culture  Setup Time     Final   Value: 12/10/2012 12:57     Performed at [facility]   Culture     Final   Value: NO GROWTH 5 DAYS     Performed at [facility]   Report Status 12/16/2012 FINAL   Final     Studies: Dg Chest Port 1 View  12/17/2012   CLINICAL DATA:  Pneumonia, pleural effusion  EXAM: PORTABLE CHEST - 1 VIEW  COMPARISON:  Portable exam 0507 hr compared to 12/15/2012  FINDINGS: Nasogastric tube extends into stomach.  Left jugular central venous catheter tip projects over left brachiocephalic vein near SVC confluence.  Stable heart size and mediastinal contours.  Right basilar atelectasis.  Persistent accentuation of interstitial markings bilaterally, greater on right.  Decreased pneumoperitoneum since previous exam, noted previously.  No pneumothorax.  IMPRESSION: Decreased pneumoperitoneum.  Right basilar atelectasis.  Suspect mild diffuse bilateral interstitial infiltrates, right greater than left, question edema versus infection.  a    Electronically Signed   By: Ulyses Southward M.D.   On: 12/17/2012 08:13    Scheduled Meds: . antiseptic oral rinse  1 application Mouth Rinse QID  . chlorhexidine  15 mL Mouth/Throat BID  . famotidine (PEPCID) IV  20 mg Intravenous Q24H  . heparin subcutaneous  5,000 Units Subcutaneous Q8H  . insulin aspart  0-9 Units Subcutaneous Q8H  . metronidazole  500 mg Intravenous Q8H  . micafungin (MYCAMINE) IV  100 mg Intravenous Q24H  . piperacillin-tazobactam (ZOSYN)  IV  3.375 g Intravenous Q8H  . vancomycin  500 mg Per Tube Q6H   Continuous Infusions: . Marland KitchenTPN (CLINIMIX-E) Adult 95 mL/hr at 12/17/12 1727   And  . fat emulsion 250 mL (12/17/12 1727)  . Marland KitchenTPN (CLINIMIX-E) Adult     And  . fat emulsion      Principal Problem:   Septic shock Active Problems:   Diarrhea   Organic brain syndrome (chronic)   Deaf   ARF (acute renal failure)   Secondary hypotension   SBO (small bowel obstruction)   HTN (hypertension)   Neuropathy   Clostridium difficile colitis   Wheezes   Acute respiratory failure with hypoxia    Time spent: 35 minutes    Hollice Espy  [facility] Pager [phone]. If 7PM-7AM, please contact night-coverage at [email or web address], password [facility] 12/18/2012, 11:26 AM  LOS: 13 days

## 2012-12-18 NOTE — Progress Notes (Signed)
PARENTERAL NUTRITION CONSULT NOTE - Follow-up  Pharmacy Consult for TNA Indication: NPO  No Known Allergies  Patient Measurements: Height: 6' (182.9 cm) Weight: 175 lb 0.7 oz (79.4 kg) IBW/kg (Calculated) : 77.6 Adjusted Body Weight: 81 kg Weight PTA: 92 kg  Vital Signs: Temp: 98.3 F (36.8 C) (12/03 0400) Temp src: Oral (12/03 0400) BP: 130/70 mmHg (12/03 0600) Pulse Rate: 81 (12/03 0600) Intake/Output from previous day: 12/02 0701 - 12/03 0700 In: 3400 [IV Piggyback:1090; TPN:2310] Out: 1775 [Urine:925; Emesis/NG output:850] Intake/Output from this shift:    Labs:  Recent Labs  12/16/12 0628  WBC 16.6*  HGB 12.7*  HCT 36.5*  PLT 405*     Recent Labs  12/16/12 0628 12/17/12 0400 12/18/12 0300  NA 131* 132* 131*  K 4.4 3.7 4.0  CL 97 97 99  CO2 25 25 23   GLUCOSE 115* 114* 130*  BUN 27* 27* 27*  CREATININE 0.98 1.05 1.00  CALCIUM 7.9* 7.9* 7.9*  MG 2.1  --   --   PHOS 3.4  --   --   PROT 6.4  --   --   ALBUMIN 2.2*  --   --   AST 30  --   --   ALT 14  --   --   ALKPHOS 71  --   --   BILITOT 0.5  --   --   PREALBUMIN 17.5*  --   --   TRIG 95  --   --    Estimated Creatinine Clearance: 79.8 ml/min (by C-G formula based on Cr of 1).    Recent Labs  12/17/12 0253 12/17/12 1531 12/17/12 2207  GLUCAP 126* 107* 101*   Medications:  Scheduled:  . antiseptic oral rinse  1 application Mouth Rinse QID  . chlorhexidine  15 mL Mouth/Throat BID  . famotidine (PEPCID) IV  20 mg Intravenous Q24H  . heparin subcutaneous  5,000 Units Subcutaneous Q8H  . insulin aspart  0-9 Units Subcutaneous Q8H  . metronidazole  500 mg Intravenous Q8H  . micafungin (MYCAMINE) IV  100 mg Intravenous Q24H  . piperacillin-tazobactam (ZOSYN)  IV  3.375 g Intravenous Q8H  . vancomycin  500 mg Per Tube Q6H   Infusions:  . Marland KitchenTPN (CLINIMIX-E) Adult 95 mL/hr at 12/17/12 1727   And  . fat emulsion 250 mL (12/17/12 1727)   Insulin Requirements in the past 24 hours:  No hx  of DM, no SSI required  Current Nutrition:  Clinimix E at 85ml/hr + Lipids 20% at 10 ml/hr. NPO.  IVF: None  Assessment: 67 yo with hx HTN, mental retardation, deafness who presented to ER from group home 11/20 s/p 12 hours of vomiting and diarrhea and initial BP of 81/50. Complaint of RLQ pain, found positive for CDiff. Pneumoperitoneum on CT, probable ruptured viscus, ileus vs partial SBO, colitis. Plan per CCS: observe x 24 hr, re-evaluate for need for laparotomy, unless clinical picture worsens. Tube feed not begun d/t suspected GI perf, began TNA via TL central line placed 11/22.    Glucose: at goal < 150 Lytes: K wnl with replacement 12/2; Na remains low but stable last 48hrs. All others wnl  LFT's: wnl 12/1 Triglycerides: wnl, 95 (12/1) Pre-albumin: improving, 7.8 (11/28), 17.5 (12/1)  Nutritional Goals/ 24 hr: per RD(12/1) KCal: 2100-2300 Protein: 110-125 grams  Fluid: >2 L/day  Goal rate Clinimix E 5/15 of 35ml/hr + 20% lipids at 32ml/hr to deliver 114g protein and 2099 KCal per 24 hr.  11/30: Abd  CT showed improvement but a new small abscess. 12/1: Extubated. NG output decreased. 12/2: NGT clamped, 850cc output 12/3: NGT with 400cc green bilious output this am. Not ready to start enteral feeding per CCS.  Plan:    Cont Clinimix E 5/15 at 14ml/hr.  Cont lipids 20% at 10 ml/hr.  TNA to contain standard multivitamins and trace elements.  Continue SSI sensitive to q8h  TNA lab panels on Mondays & Thursdays.  Loralee Pacas, PharmD, BCPS Pager: [phone] 12/18/2012,7:58 AM.

## 2012-12-19 DIAGNOSIS — K668 Other specified disorders of peritoneum: Secondary | ICD-10-CM

## 2012-12-19 DIAGNOSIS — K651 Peritoneal abscess: Secondary | ICD-10-CM | POA: Diagnosis present

## 2012-12-19 HISTORY — DX: Peritoneal abscess: K65.1

## 2012-12-19 HISTORY — DX: Other specified disorders of peritoneum: K66.8

## 2012-12-19 LAB — CBC
Hemoglobin: 12.6 g/dL — ABNORMAL LOW (ref 13.0–17.0)
MCH: 28.8 pg (ref 26.0–34.0)
MCHC: 34.3 g/dL (ref 30.0–36.0)
Platelets: 601 10*3/uL — ABNORMAL HIGH (ref 150–400)
RDW: 14 % (ref 11.5–15.5)

## 2012-12-19 LAB — COMPREHENSIVE METABOLIC PANEL
Alkaline Phosphatase: 84 U/L (ref 39–117)
BUN: 26 mg/dL — ABNORMAL HIGH (ref 6–23)
Calcium: 7.9 mg/dL — ABNORMAL LOW (ref 8.4–10.5)
Chloride: 100 mEq/L (ref 96–112)
GFR calc Af Amer: 89 mL/min — ABNORMAL LOW (ref >90)
Glucose, Bld: 129 mg/dL — ABNORMAL HIGH (ref 70–99)
Potassium: 4.3 mEq/L (ref 3.5–5.1)
Total Bilirubin: 0.5 mg/dL (ref 0.3–1.2)

## 2012-12-19 LAB — GLUCOSE, CAPILLARY
Glucose-Capillary: 114 mg/dL — ABNORMAL HIGH (ref 70–99)
Glucose-Capillary: 125 mg/dL — ABNORMAL HIGH (ref 70–99)
Glucose-Capillary: 140 mg/dL — ABNORMAL HIGH (ref 70–99)

## 2012-12-19 LAB — MAGNESIUM: Magnesium: 2.4 mg/dL (ref 1.5–2.5)

## 2012-12-19 MED ORDER — CEFTRIAXONE SODIUM 1 G IJ SOLR
1.0000 g | INTRAMUSCULAR | Status: DC
  Administered 2012-12-19 – 2012-12-23 (×5): 1 g via INTRAVENOUS
  Filled 2012-12-19 (×5): qty 10

## 2012-12-19 MED ORDER — METRONIDAZOLE 50 MG/ML ORAL SUSPENSION
500.0000 mg | Freq: Three times a day (TID) | ORAL | Status: DC
  Administered 2012-12-19 – 2012-12-20 (×3): 500 mg via ORAL
  Filled 2012-12-19 (×6): qty 10

## 2012-12-19 MED ORDER — FAT EMULSION 20 % IV EMUL
250.0000 mL | INTRAVENOUS | Status: AC
  Administered 2012-12-19: 18:00:00 250 mL via INTRAVENOUS
  Filled 2012-12-19: qty 250

## 2012-12-19 MED ORDER — TRACE MINERALS CR-CU-F-FE-I-MN-MO-SE-ZN IV SOLN
INTRAVENOUS | Status: AC
  Administered 2012-12-19: 18:00:00 via INTRAVENOUS
  Filled 2012-12-19: qty 2000

## 2012-12-19 NOTE — Progress Notes (Addendum)
CSW continues to follow for discharge planning. Patient was admitted from [facility] group home, though PT recommended that patient go to SNF for rehab before returning. Group home director, Cala Bradford & patient's sister/guardian - Liborio Nixon aware and has agreed that patient's information be faxed out to [facility]. CSW will follow-up with bed offers & submitted for pasarr.   Clinical Social Work Department CLINICAL SOCIAL WORK PLACEMENT NOTE 12/19/2012  Patient:  Victor Little, Victor Little  Account Number:  [id number] Admit date:  12/05/2012  Clinical Social Worker:  Orpah Greek  Date/time:  12/19/2012 10:54 AM  Clinical Social Work is seeking post-discharge placement for this patient at the following level of care:   SKILLED NURSING   (*CSW will update this form in Epic as items are completed)   12/19/2012  Patient/family provided with [facility] Department of Clinical Social Work's list of facilities offering this level of care within the geographic area requested by the patient (or if unable, by the patient's family).  12/19/2012  Patient/family informed of their freedom to choose among providers that offer the needed level of care, that participate in Medicare, Medicaid or managed care program needed by the patient, have an available bed and are willing to accept the patient.  12/19/2012  Patient/family informed of [facility]' ownership interest in [facility], as well as of the fact that they are under no obligation to receive care at this facility.  PASARR submitted to [employer] on 12/19/2012 PASARR number received from [employer] on   FL2 transmitted to all facilities in geographic area requested by pt/family on  12/19/2012 FL2 transmitted to all facilities within larger geographic area on   Patient informed that his/her managed care company has contracts with or will negotiate with  certain facilities, including the following:     Patient/family informed of bed offers  received:   Patient chooses bed at  Physician recommends and patient chooses bed at    Patient to be transferred to  on   Patient to be transferred to facility by   The following physician request were entered in Epic:   Additional Comments:    Unice Bailey, LCSW [facility] Clinical Social Worker cell #: [phone]

## 2012-12-19 NOTE — Progress Notes (Signed)
Subjective: No change  Objective: Vital signs in last 24 hours: Temp:  [98.2 F (36.8 C)-99.3 F (37.4 C)] 98.2 F (36.8 C) (12/04 0654) Pulse Rate:  [86-100] 88 (12/04 0654) Resp:  [18] 18 (12/04 0654) BP: (141-159)/(73-85) 152/85 mmHg (12/04 0654) SpO2:  [95 %-99 %] 95 % (12/04 0654) Weight:  [76.2 kg (167 lb 15.9 oz)-77 kg (169 lb 12.1 oz)] 77 kg (169 lb 12.1 oz) (12/04 0654) Last BM Date: 12/17/12 800PO recorded, i don't now what that means, He's NPO and on TNA.  Not much in the cannister. Afebrile, VSS Na is down but stable WBC 14K He is still NPO On TNA Intake/Output from previous day: 12/03 0701 - 12/04 0700 In: 3247 [NG/GT:800; IV Piggyback:340; TPN:2107] Out: 1930 [Urine:1630; Emesis/NG output:300] Intake/Output this shift:    General appearance: alert and non verbal.  no response to me today, with abdominal exam. GI: soft, non-tender; bowel sounds normal; no masses,  no organomegaly  Lab Results:   Recent Labs  12/18/12 0800 12/19/12 0416  WBC 12.7* 14.0*  HGB 12.4* 12.6*  HCT 36.4* 36.7*  PLT 529* 601*    BMET  Recent Labs  12/18/12 0300 12/19/12 0416  NA 131* 132*  K 4.0 4.3  CL 99 100  CO2 23 23  GLUCOSE 130* 129*  BUN 27* 26*  CREATININE 1.00 1.00  CALCIUM 7.9* 7.9*   PT/INR No results found for this basename: LABPROT, INR,  in the last 72 hours   Recent Labs Lab 12/13/12 0530 12/16/12 0628 12/19/12 0416  AST 17 30 19   ALT 11 14 12   ALKPHOS 59 71 84  BILITOT 0.5 0.5 0.5  PROT 5.3* 6.4 6.9  ALBUMIN 1.9* 2.2* 2.4*     Lipase     Component Value Date/Time   LIPASE 48 12/05/2012 0747     Studies/Results: Dg Wrist 2 Views Right  12/18/2012   CLINICAL DATA:  Positioning is limited due to patient's clinical condition. The patient is complaining of right wrist pain.  EXAM: RIGHT WRIST - 2 VIEW  COMPARISON:  None.  FINDINGS: The bones appear reasonably well mineralized for age. There is no evidence of an acute fracture nor  dislocation. Mild degenerative change of the 1st carpometacarpal joint is suspected.  IMPRESSION: No acute bony abnormality of the right wrist is demonstrated on this limited two view series.   Electronically Signed   By: David  [place]   On: 12/18/2012 13:11   Dg Abd 2 Views  12/18/2012   CLINICAL DATA:  Pneumoperitoneum, C diff colitis  EXAM: ABDOMEN - 2 VIEW  COMPARISON:  CT 12/15/2012  FINDINGS: There is a moderate volume of intraperitoneal free air seen on the left lateral decubitus view outlining the liver. NG tube extends to the stomach. Oral contrast from prior CT is within the colon in a similar pattern to CT exam of 12/15/2012. No dilated loops of large or small bowel.  IMPRESSION: 1. Persistent pneumoperitoneum demonstrated. 2. Ileus pattern.   Electronically Signed   By: Genevive Bi M.D.   On: 12/18/2012 13:15    Medications: . antiseptic oral rinse  1 application Mouth Rinse QID  . chlorhexidine  15 mL Mouth/Throat BID  . famotidine (PEPCID) IV  20 mg Intravenous Q24H  . heparin subcutaneous  5,000 Units Subcutaneous Q8H  . insulin aspart  0-9 Units Subcutaneous Q8H  . micafungin (MYCAMINE) IV  100 mg Intravenous Q24H  . piperacillin-tazobactam (ZOSYN)  IV  3.375 g Intravenous Q8H   . Marland Kitchen  TPN (CLINIMIX-E) Adult 95 mL/hr at 12/18/12 1757   And  . fat emulsion 250 mL (12/18/12 1757)    Assessment/Plan Cdiff Colitis with pneumoperitoneum  SBO vs ileus  Acute respiratory failure/aspiration pneumonia  Resolved acute renal failure  Encephalopathy with history of mental retardation/deaf/'mute  Right wrist pain (films normal)   Plan:  I will start some clamping trials, and see if it make a difference.  Check with Dr. Daphine Deutscher and see if we can transition him to clears.   LOS: 14 days    Victor Little 12/19/2012

## 2012-12-19 NOTE — Progress Notes (Signed)
PARENTERAL NUTRITION CONSULT NOTE - Follow-up  Pharmacy Consult for TNA Indication: NPO  No Known Allergies  Patient Measurements: Height: 6' (182.9 cm) Weight: 169 lb 12.1 oz (77 kg) IBW/kg (Calculated) : 77.6 Adjusted Body Weight: 81 kg Weight PTA: 92 kg  Vital Signs: Temp: 98.2 F (36.8 C) (12/04 0654) Temp src: Oral (12/04 0654) BP: 152/85 mmHg (12/04 0654) Pulse Rate: 88 (12/04 0654) Intake/Output from previous day: 12/03 0701 - 12/04 0700 In: 3247 [NG/GT:800; IV Piggyback:340; TPN:2107] Out: 1930 [Urine:1630; Emesis/NG output:300]  Labs:  Recent Labs  12/18/12 0800 12/19/12 0416  WBC 12.7* 14.0*  HGB 12.4* 12.6*  HCT 36.4* 36.7*  PLT 529* 601*     Recent Labs  12/17/12 0400 12/18/12 0300 12/19/12 0416  NA 132* 131* 132*  K 3.7 4.0 4.3  CL 97 99 100  CO2 25 23 23   GLUCOSE 114* 130* 129*  BUN 27* 27* 26*  CREATININE 1.05 1.00 1.00  CALCIUM 7.9* 7.9* 7.9*  MG  --   --  2.4  PHOS  --   --  3.4  PROT  --   --  6.9  ALBUMIN  --   --  2.4*  AST  --   --  19  ALT  --   --  12  ALKPHOS  --   --  84  BILITOT  --   --  0.5   Estimated Creatinine Clearance: 79.1 ml/min (by C-G formula based on Cr of 1).    Recent Labs  12/18/12 1516 12/18/12 2152 12/19/12 0622  GLUCAP 119* 114* 125*   Medications:  Infusions:  . Marland KitchenTPN (CLINIMIX-E) Adult 95 mL/hr at 12/18/12 1757   And  . fat emulsion 250 mL (12/18/12 1757)   Insulin Requirements in the past 24 hours:   No hx of DM  1 unit Novolog required - sensitive SSI q8h  Nutritional Goals/ 24 hr:   RD recommendations (updated 12/3):  2100-2300 KCal/day, 90-105 grams protein/day, Fluid 2.1-2.3 L/day   Goal rate Clinimix E 5/20 of 65ml/hr + 20% lipids at 76ml/hr to deliver 100g protein and 2233 KCal per 24 hr.  Current Nutrition:   Clinimix E 5/15 at 51ml/hr + Lipids 20% at 10 ml/hr.  NPO  IVF: None  Assessment:  67 yo with hx HTN, mental retardation, deafness admitted from group home 11/20  s/p 12 hours of vomiting, diarrhea, hypotension, and c/o RLQ pain.  +Cdiff.  Pneumoperitoneum on CT, probable ruptured viscus, ileus vs partial SBO, colitis.  Tube feed not begun d/t suspected GI perf, began TNA 11/27.  CT showed improvement but new small abscess on 11/30.  12/4:  NGT output decreased to 300 mL for 12/3.  NG tube clamping trial today, and CCS is considering/ looking into diet transition to clears today.  Follow up if tolerating tomorrow and consider reducing or weaning TPN.  Labs  Glucose: at goal < 150  Electrolytes: Na low (unable to adjust).  K, Mag and Phos wnl.  Corr Ca 9.18 Renal: SCr stable and wnl, UOP 0.88 ml/kg/hr. LFTs: wnl (12/1) TGs: wnl, 95 (12/1) Prealbumin: improving, 7.8 (11/28), 17.5 (12/1)  TPN Access: Triple lumen PICC placed 12/3 (TL CVC removed 12/3) TPN day#: 8  Plan:    Change to Clinimix E 5/20 at goal rate of 83 ml/hr.  New formula and rate to better match updated RD protein and energy recommendations  TNA to contain standard multivitamins and trace elements.  Cont lipids 20% at 10 ml/hr.  Continue  SSI sensitive scale q8h  IVF per MD  TNA lab panels on Mondays & Thursdays.    Lynann Beaver PharmD, BCPS Pager [phone] 12/19/2012 2:13 PM

## 2012-12-19 NOTE — Progress Notes (Signed)
Left internal jugular triple lumen catheter removed per MD order. Sterile petrolatum guaze dressing applied. Central catheter intact. Site clean and dry. No bleeding noted.

## 2012-12-19 NOTE — Progress Notes (Addendum)
[facility] PROGRESS NOTE  Victor Little RUE:[id number] DOB: Jul 19, 1945 DOA: 12/05/2012 PCP: Pcp Not In System  Interim summary:  Patient is a 67 year old white male with past medical history of deafness, mental retardation from a group home who presented on 11/20 to the emergency room after multiple episodes of diarrhea. Patient was noted to have a a markedly elevated white blood cell count, acute renal failure and signs consistent with septic shock likely from C. difficile colitis. He was also noted to have a small bowel obstruction an NG tube was placed. Cultures came back soon after positive for C. difficile and she was admitted to the ICU started on IV Flagyl and by mouth vancomycin enemas.   By 11/22, patient had a vomiting episode which led to aspiration and secondary respiratory failure and intubation. At this point, critical care became the primary attending. Patient was continued on antibiotics plus respirator. And then on 11/25, and abdominal x-ray following a small bowel obstruction also noted air-fluid levels. General surgery was consulted and a CT of the abdomen and pelvis noted pneumoperitoneum. Patient was watched closely and vital signs and lab work all looks to be improving with a benign abdominal exam and was felt likely patient had a perforation that had sealed in the past. On 11/26, patient had low-grade fevers, so antifungal agent, miacalcin, was added for antifungal prevention. Followup CT of abdomen and pelvis done 11/30 noted increased bilateral pleural effusions with present right middle and lower lobe pneumonia and decreased pneumoperitoneum. Patient is also noted to have 3.7 cm abscess in the left lower quadrant.  On 12/1, patient was able to be extubated and weaned down to minimal oxygen. His acute renal failure which is felt to be secondary to septic shock and hypotension had since resolved as well. His white blood cell count has continued to improve. He was  transferred to the hospitalist service after 12/2 Because of prolonged ventilator support and current n.p.o. status given bowel issues, TPN has been recommended. On 12/3, hospitalist attending has ordered a PICC line and pharmacy for TPN. Surgery this point plans to monitor patient in regards to abscess given his overall improvement. He saw significant NG drainage to NG tube was recommended to be left in for now. Nursing also noted some guarding of the right wrist and pain with movement so x-rays were done which were unremarkable.  Spoke with infectious disease on 12/4. Patient initially on IV Zosyn and vancomycin for aspiration pneumonia and then Zosyn continued for abdominal abscess. Patient has been on enteral vancomycin and IV Flagyl for C. difficile and has completed full course. Infectious disease recommending discontinuation of Zosyn and changing to IV Rocephin until abdominal abscess resolves. They also recommend continuing coverage for C. difficile until IV Rocephin discontinued. They recommend much simpler course using by mouth Flagyl.   Assessment/Plan: Principal Problem:   Septic shock: Resolved Active Problems:   Diarrhea: Secondary C. difficile, resolved   Organic brain syndrome (chronic)   Deaf   ARF (acute renal failure): Felt to be secondary to hypotension from septic shock. Result.   Secondary hypotension: Resolved, secondary to septic shock    SBO (small bowel obstruction): Continue NG tube, slowly getting better, surgery clamped on 12/4   HTN (hypertension): Stable.   Neuropathy   Clostridium difficile colitis: Stable. See above for antibiotic coverage  Aspiration pneumonia: Followup chest x-ray looks more like edema rather than pneumonia. BNP was normal. See above for antibiotic coverage    Acute respiratory failure with  hypoxia: Resolved, secondary to aspiration pneumonia  Left lower quadrant abscess: Continue to observe as per surgery, see above for antibiotic  coverage  Pneumoperitoneum: Looks to be self improved   Code Status: Full code Family Communication: Have left message with contact from group home yesterday Disposition Plan: Transfer to floor, hopefully start by mouth soon, once NG tube resolved   Consultants:  Critical Care  General surgery  Procedures:  Intubation the patient 11/22-12/1  Placement of left IJ central line 11/22  Antibiotics: IV Flagyl 11/20 >> 12/4 Zosyn 11/20 >> 12/4 Enteral Vancomycin 11/20 >> 12/30 IV Vancomycin 11/24 >> 12/01  Micafungin 11/26 >> 12/4  Flagyl po 12/4 >> Rocephin IV 12/4 >>    HPI/Subjective: Patient unable to really give me any history or complaints given history of MR, deafness and mute  Objective: Filed Vitals:   12/19/12 1411  BP: 126/73  Pulse: 90  Temp: 99.3 F (37.4 C)  Resp: 18    Intake/Output Summary (Last 24 hours) at 12/19/12 1456 Last data filed at 12/19/12 1417  Gross per 24 hour  Intake   3142 ml  Output   2150 ml  Net    992 ml   Filed Weights   12/18/12 0300 12/18/12 1323 12/19/12 0654  Weight: 79.4 kg (175 lb 0.7 oz) 76.2 kg (167 lb 15.9 oz) 77 kg (169 lb 12.1 oz)    Exam:   General:  Appears in no acute distress, patient has chronic mental retardation  Cardiovascular: Regular rate and rhythm, S1-S2  Respiratory: Clear to auscultation bilaterally, limited inspiration  Abdomen: Soft,? non-tender, positive bowel sounds, nondistended  Musculoskeletal: No clubbing or cyanosis or edema  Data Reviewed: Basic Metabolic Panel:  Recent Labs Lab 12/13/12 0530  12/15/12 0520 12/16/12 0628 12/17/12 0400 12/18/12 0300 12/19/12 0416  NA 142  < > 138 131* 132* 131* 132*  K 3.4*  < > 3.3* 4.4 3.7 4.0 4.3  CL 111  < > 98 97 97 99 100  CO2 23  < > 27 25 25 23 23   GLUCOSE 134*  < > 123* 115* 114* 130* 129*  BUN 20  < > 26* 27* 27* 27* 26*  CREATININE 1.00  < > 1.14 0.98 1.05 1.00 1.00  CALCIUM 7.4*  < > 7.7* 7.9* 7.9* 7.9* 7.9*  MG 2.1  --    --  2.1  --   --  2.4  PHOS 2.9  --   --  3.4  --   --  3.4  < > = values in this interval not displayed. Liver Function Tests:  Recent Labs Lab 12/13/12 0530 12/16/12 0628 12/19/12 0416  AST 17 30 19   ALT 11 14 12   ALKPHOS 59 71 84  BILITOT 0.5 0.5 0.5  PROT 5.3* 6.4 6.9  ALBUMIN 1.9* 2.2* 2.4*   No results found for this basename: LIPASE, AMYLASE,  in the last 168 hours No results found for this basename: AMMONIA,  in the last 168 hours CBC:  Recent Labs Lab 12/13/12 0530 12/15/12 0520 12/16/12 0628 12/18/12 0800 12/19/12 0416  WBC 15.4* 16.3* 16.6* 12.7* 14.0*  NEUTROABS 12.6*  --  13.0*  --   --   HGB 11.3* 12.8* 12.7* 12.4* 12.6*  HCT 33.1* 37.1* 36.5* 36.4* 36.7*  MCV 84.2 83.7 83.1 84.3 83.8  PLT 281 356 405* 529* 601*   Cardiac Enzymes: No results found for this basename: CKTOTAL, CKMB, CKMBINDEX, TROPONINI,  in the last 168 hours BNP (last 3  results)  Recent Labs  12/07/12 0335 12-20-2012 0300  PROBNP 680.8* 53.8   CBG:  Recent Labs Lab 12-20-12 1227 12/20/2012 1516 Dec 20, 2012 2152 12/19/12 0622 12/19/12 1408  GLUCAP 116* 119* 114* 125* 127*    Recent Results (from the past 240 hour(s))  CULTURE, BLOOD (ROUTINE X 2)     Status: None   Collection Time    12/10/12 10:40 AM      Result Value Range Status   Specimen Description BLOOD LEFT ARM   Final   Special Requests BOTTLES DRAWN AEROBIC AND ANAEROBIC 2CC   Final   Culture  Setup Time     Final   Value: 12/10/2012 12:57     Performed at [facility]   Culture     Final   Value: NO GROWTH 5 DAYS     Performed at [facility]   Report Status 12/16/2012 FINAL   Final  CULTURE, BLOOD (ROUTINE X 2)     Status: None   Collection Time    12/10/12 10:40 AM      Result Value Range Status   Specimen Description BLOOD RIGHT ARM   Final   Special Requests BOTTLES DRAWN AEROBIC AND ANAEROBIC 5CC   Final   Culture  Setup Time     Final   Value: 12/10/2012 12:57     Performed at  [facility]   Culture     Final   Value: NO GROWTH 5 DAYS     Performed at [facility]   Report Status 12/16/2012 FINAL   Final     Studies: Dg Wrist 2 Views Right  12/20/2012   CLINICAL DATA:  Positioning is limited due to patient's clinical condition. The patient is complaining of right wrist pain.  EXAM: RIGHT WRIST - 2 VIEW  COMPARISON:  None.  FINDINGS: The bones appear reasonably well mineralized for age. There is no evidence of an acute fracture nor dislocation. Mild degenerative change of the 1st carpometacarpal joint is suspected.  IMPRESSION: No acute bony abnormality of the right wrist is demonstrated on this limited two view series.   Electronically Signed   By: David  [place]   On: Dec 20, 2012 13:11   Dg Abd 2 Views  Dec 20, 2012   CLINICAL DATA:  Pneumoperitoneum, C diff colitis  EXAM: ABDOMEN - 2 VIEW  COMPARISON:  CT 12/15/2012  FINDINGS: There is a moderate volume of intraperitoneal free air seen on the left lateral decubitus view outlining the liver. NG tube extends to the stomach. Oral contrast from prior CT is within the colon in a similar pattern to CT exam of 12/15/2012. No dilated loops of large or small bowel.  IMPRESSION: 1. Persistent pneumoperitoneum demonstrated. 2. Ileus pattern.   Electronically Signed   By: Genevive Bi M.D.   On: 12/20/2012 13:15    Scheduled Meds: . antiseptic oral rinse  1 application Mouth Rinse QID  . chlorhexidine  15 mL Mouth/Throat BID  . famotidine (PEPCID) IV  20 mg Intravenous Q24H  . heparin subcutaneous  5,000 Units Subcutaneous Q8H  . insulin aspart  0-9 Units Subcutaneous Q8H  . micafungin (MYCAMINE) IV  100 mg Intravenous Q24H  . piperacillin-tazobactam (ZOSYN)  IV  3.375 g Intravenous Q8H   Continuous Infusions: . Marland KitchenTPN (CLINIMIX-E) Adult 95 mL/hr at 2012-12-20 1757   And  . fat emulsion 250 mL (20-Dec-2012 1757)  . Marland KitchenTPN (CLINIMIX-E) Adult     And  . fat emulsion  Principal Problem:   Septic  shock Active Problems:   Diarrhea   Organic brain syndrome (chronic)   Deaf   ARF (acute renal failure)   Secondary hypotension   SBO (small bowel obstruction)   HTN (hypertension)   Neuropathy   Clostridium difficile colitis   Wheezes   Acute respiratory failure with hypoxia    Time spent: 35 minutes    Hollice Espy  [facility] Pager [phone]. If 7PM-7AM, please contact night-coverage at [email or web address], password [facility] 12/19/2012, 2:56 PM  LOS: 14 days

## 2012-12-20 DIAGNOSIS — K668 Other specified disorders of peritoneum: Secondary | ICD-10-CM

## 2012-12-20 DIAGNOSIS — F079 Unspecified personality and behavioral disorder due to known physiological condition: Secondary | ICD-10-CM

## 2012-12-20 DIAGNOSIS — K651 Peritoneal abscess: Secondary | ICD-10-CM

## 2012-12-20 LAB — BASIC METABOLIC PANEL
BUN: 28 mg/dL — ABNORMAL HIGH (ref 6–23)
Calcium: 8.1 mg/dL — ABNORMAL LOW (ref 8.4–10.5)
GFR calc Af Amer: >90 mL/min (ref >90)
GFR calc non Af Amer: 84 mL/min — ABNORMAL LOW (ref >90)
Glucose, Bld: 144 mg/dL — ABNORMAL HIGH (ref 70–99)
Potassium: 4.5 mEq/L (ref 3.5–5.1)

## 2012-12-20 LAB — GLUCOSE, CAPILLARY
Glucose-Capillary: 143 mg/dL — ABNORMAL HIGH (ref 70–99)
Glucose-Capillary: 123 mg/dL — ABNORMAL HIGH (ref 70–99)

## 2012-12-20 MED ORDER — FAT EMULSION 20 % IV EMUL
250.0000 mL | INTRAVENOUS | Status: AC
  Administered 2012-12-20: 17:00:00 250 mL via INTRAVENOUS
  Filled 2012-12-20: qty 250

## 2012-12-20 MED ORDER — METRONIDAZOLE IN NACL 5-0.79 MG/ML-% IV SOLN
500.0000 mg | Freq: Three times a day (TID) | INTRAVENOUS | Status: DC
  Administered 2012-12-20 – 2012-12-27 (×21): 500 mg via INTRAVENOUS
  Filled 2012-12-20 (×23): qty 100

## 2012-12-20 MED ORDER — TRACE MINERALS CR-CU-F-FE-I-MN-MO-SE-ZN IV SOLN
INTRAVENOUS | Status: AC
  Administered 2012-12-20: 17:00:00 via INTRAVENOUS
  Filled 2012-12-20: qty 2000

## 2012-12-20 NOTE — Progress Notes (Signed)
  Subjective: Not much out of his ng today, but he has pulled it out about 80% so I just removed it.  Objective: Vital signs in last 24 hours: Temp:  [98.7 F (37.1 C)-99.3 F (37.4 C)] 98.7 F (37.1 C) (12/05 0555) Pulse Rate:  [90-98] 98 (12/05 0555) Resp:  [18] 18 (12/05 0555) BP: (126-133)/(66-78) 130/78 mmHg (12/05 0555) SpO2:  [95 %-96 %] 95 % (12/05 0555) Weight:  [75.6 kg (166 lb 10.7 oz)] 75.6 kg (166 lb 10.7 oz) (12/05 0602) Last BM Date: 12/19/12 800 ml from the NG yesterday recorded. Down to 1 stool Afebrile, VSS Last film 12/18/12 shows persistent pneumoperitoneum/ileus Intake/Output from previous day: 12/04 0701 - 12/05 0700 In: 2458 [IV Piggyback:100; FAO:1308] Out: 2475 [Urine:1675; Emesis/NG output:800] Intake/Output this shift: Total I/O In: -  Out: 150 [Emesis/NG output:150]  General appearance: alert, cooperative and no distress GI: soft, non-tender; bowel sounds normal; no masses,  no organomegaly  Lab Results:   Recent Labs  12/18/12 0800 12/19/12 0416  WBC 12.7* 14.0*  HGB 12.4* 12.6*  HCT 36.4* 36.7*  PLT 529* 601*    BMET  Recent Labs  12/19/12 0416 12/20/12 0444  NA 132* 133*  K 4.3 4.5  CL 100 100  CO2 23 23  GLUCOSE 129* 144*  BUN 26* 28*  CREATININE 1.00 0.96  CALCIUM 7.9* 8.1*   PT/INR No results found for this basename: LABPROT, INR,  in the last 72 hours   Recent Labs Lab 12/16/12 0628 12/19/12 0416  AST 30 19  ALT 14 12  ALKPHOS 71 84  BILITOT 0.5 0.5  PROT 6.4 6.9  ALBUMIN 2.2* 2.4*     Lipase     Component Value Date/Time   LIPASE 48 12/05/2012 0747     Studies/Results: No results found.  Medications: . antiseptic oral rinse  1 application Mouth Rinse QID  . cefTRIAXone (ROCEPHIN)  IV  1 g Intravenous Q24H  . chlorhexidine  15 mL Mouth/Throat BID  . famotidine (PEPCID) IV  20 mg Intravenous Q24H  . heparin subcutaneous  5,000 Units Subcutaneous Q8H  . insulin aspart  0-9 Units Subcutaneous Q8H   . metroNIDAZOLE  500 mg Oral Q8H    Assessment/Plan Cdiff Colitis with pneumoperitoneum  SBO vs ileus  Acute respiratory failure/aspiration pneumonia  Resolved acute renal failure  Encephalopathy with history of mental retardation/deaf/'mute  Right wrist pain (films normal)   Plan:  Swallow eval and then try some clears.   LOS: 15 days    Tatiana Courter 12/20/2012

## 2012-12-20 NOTE — Progress Notes (Signed)
Physical Therapy Treatment Patient Details Name: Victor Little MRN: [id number] DOB: [birth year] Today's Date: 12/20/2012 Time: 4098-1191 PT Time Calculation (min): 19 min  PT Assessment / Plan / Recommendation  History of Present Illness 67 yo male from residential group home with C diff colitis, possible SBO vs ileus and sepsis.  Hx of mental retardation, deaf, mute. 11/22 Aspiration, VDRF, CCS consulted, PCCM assumed care and pt extubated 12/1   PT Comments   Pt with diminished ability to communicate due to MR/deaf; not progressing today, seems to be having a bad day, not assisting with mobility  Follow Up Recommendations  SNF     Does the patient have the potential to tolerate intense rehabilitation     Barriers to Discharge        Equipment Recommendations       Recommendations for Other Services    Frequency Min 3X/week   Progress towards PT Goals Progress towards PT goals: Progressing toward goals  Plan Current plan remains appropriate    Precautions / Restrictions Precautions Precautions: Fall Precaution Comments: multiple lines/leads, NG tube   Pertinent Vitals/Pain VSS (continuous  O2 sat monitor)    Mobility  Bed Mobility Bed Mobility: Supine to Sit Supine to Sit: 1: +2 Total assist Supine to Sit: Patient Percentage: 0% Details for Bed Mobility Assistance: pt did not attempt to assist today, grimacing with movement, Transfers Transfers: Squat Pivot Transfers Squat Pivot Transfers: 1: +2 Total assist Squat Pivot Transfers: Patient Percentage: 0% Details for Transfer Assistance: unable to achieve upright posture upon first attempt as well as knees buckled so returned to sitting and then performed squat pivot to recliner Ambulation/Gait Ambulation/Gait Assistance: Not tested (comment)    Exercises     PT Diagnosis:    PT Problem List:   PT Treatment Interventions:     PT Goals (current goals can now be found in the care plan section) Acute Rehab PT  Goals PT Goal Formulation: Patient unable to participate in goal setting Time For Goal Achievement: 12/31/12 Potential to Achieve Goals: Good  Visit Information  Last PT Received On: 12/20/12 Assistance Needed: +2 History of Present Illness: 67 yo male from residential group home with C diff colitis, possible SBO vs ileus and sepsis.  Hx of mental retardation, deaf, mute. 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

## 2012-12-20 NOTE — Progress Notes (Signed)
PARENTERAL NUTRITION CONSULT NOTE - Follow-up  Pharmacy Consult for TNA Indication: NPO  No Known Allergies  Patient Measurements: Height: 6' (182.9 cm) Weight: 166 lb 10.7 oz (75.6 kg) IBW/kg (Calculated) : 77.6 Adjusted Body Weight: 81 kg Weight PTA: 92 kg  Vital Signs: Temp: 98.7 F (37.1 C) (12/05 0555) Temp src: Axillary (12/05 0555) BP: 130/78 mmHg (12/05 0555) Pulse Rate: 98 (12/05 0555) Intake/Output from previous day: 12/04 0701 - 12/05 0700 In: 2458 [IV Piggyback:100; WUJ:8119] Out: 2475 [Urine:1675; Emesis/NG output:800]  Labs:  Recent Labs  12/18/12 0800 12/19/12 0416  WBC 12.7* 14.0*  HGB 12.4* 12.6*  HCT 36.4* 36.7*  PLT 529* 601*     Recent Labs  12/18/12 0300 12/19/12 0416 12/20/12 0444  NA 131* 132* 133*  K 4.0 4.3 4.5  CL 99 100 100  CO2 23 23 23   GLUCOSE 130* 129* 144*  BUN 27* 26* 28*  CREATININE 1.00 1.00 0.96  CALCIUM 7.9* 7.9* 8.1*  MG  --  2.4  --   PHOS  --  3.4  --   PROT  --  6.9  --   ALBUMIN  --  2.4*  --   AST  --  19  --   ALT  --  12  --   ALKPHOS  --  84  --   BILITOT  --  0.5  --    Estimated Creatinine Clearance: 80.9 ml/min (by C-G formula based on Cr of 0.96).    Recent Labs  12/19/12 1408 12/19/12 2228 12/20/12 0552  GLUCAP 127* 140* 143*   Medications:  Infusions:  . Marland KitchenTPN (CLINIMIX-E) Adult 83 mL/hr at 12/19/12 1824   And  . fat emulsion 250 mL (12/19/12 1824)   Insulin Requirements in the past 24 hours:   No hx of DM  3 units Novolog required - sensitive SSI q8h  Nutritional Goals/ 24 hr:   RD recommendations (updated 12/3):  2100-2300 KCal/day, 90-105 grams protein/day, Fluid 2.1-2.3 L/day   Goal rate Clinimix E 5/20 of 51ml/hr + 20% lipids at 1ml/hr to deliver 100g protein and 2233 KCal per 24 hr.  Current Nutrition:   Clinimix  E 5/20 70ml/hr + 20% lipids 98ml/hr.  NPO  IVF: None  Assessment:  67 yo with hx HTN, mental retardation, deafness admitted from group home 11/20 s/p 12  hours of vomiting, diarrhea, hypotension, and c/o RLQ pain.  +Cdiff.  Pneumoperitoneum on CT, probable ruptured viscus, ileus vs partial SBO, colitis.  Tube feed not begun d/t suspected GI perf, began TNA 11/27.  CT showed improvement but new small abscess on 11/30.  12/5:   Pt tolerating TPN without problems.  Labs are stable.  NGT output 800 ml/ 24hr yesterday.  NG tube clamping trial began yesterday, but no further diet plans seen in notes.  CCS was considering/ looking into diet transition to clears.   Labs  Glucose: at goal < 150  Electrolytes: Na low (unable to adjust).  K, Mag and Phos wnl.  Corr Ca 9.38 Renal: SCr stable and wnl, UOP 0.93 ml/kg/hr. LFTs: wnl (12/1) TGs: wnl, 95 (12/1) Prealbumin: improving, 7.8 (11/28), 17.5 (12/1)  TPN Access: Triple lumen PICC placed 12/3 (TL CVC removed 12/3) TPN day#: 9  Plan:    Continue Clinimix E 5/20 at goal rate of 83 ml/hr.  TNA to contain standard multivitamins and trace elements.  Cont lipids 20% at 10 ml/hr.  Continue SSI sensitive scale q8h  IVF per MD  TNA lab panels  on Mondays & Thursdays.    Lynann Beaver PharmD, BCPS Pager [phone] 12/20/2012 9:52 AM

## 2012-12-20 NOTE — Progress Notes (Signed)
[facility] PROGRESS NOTE  Victor Little NWG:[id number] DOB: 1945/12/23 DOA: 12/05/2012 PCP: Pcp Not In System  HPI/Subjective:  67 yo WM PMHx deafness, mental retardation from a group home who presented on 11/20 to the emergency room after multiple episodes of diarrhea. Patient was noted to have a a markedly elevated white blood cell count, acute renal failure and signs consistent with septic shock likely from C. difficile colitis. He was also noted to have a small bowel obstruction an NG tube was placed. Cultures came back soon after positive for C. difficile and she was admitted to the ICU started on IV Flagyl and by mouth vancomycin enemas.   By 11/22, patient had a vomiting episode which led to aspiration and secondary respiratory failure and intubation. At this point, critical care became the primary attending. Patient was continued on antibiotics plus respirator. And then on 11/25, and abdominal x-ray following a small bowel obstruction also noted air-fluid levels. General surgery was consulted and a CT of the abdomen and pelvis noted pneumoperitoneum. Patient was watched closely and vital signs and lab work all looks to be improving with a benign abdominal exam and was felt likely patient had a perforation that had sealed in the past. On 11/26, patient had low-grade fevers, so antifungal agent, miacalcin, was added for antifungal prevention. Followup CT of abdomen and pelvis done 11/30 noted increased bilateral pleural effusions with present right middle and lower lobe pneumonia and decreased pneumoperitoneum. Patient is also noted to have 3.7 cm abscess in the left lower quadrant.  On 12/1, patient was able to be extubated and weaned down to minimal oxygen. His acute renal failure which is felt to be secondary to septic shock and hypotension had since resolved as well. His white blood cell count has continued to improve. He was transferred to the hospitalist service after 12/2 Because of  prolonged ventilator support and current n.p.o. status given bowel issues, TPN has been recommended. On 12/3, hospitalist attending has ordered a PICC line and pharmacy for TPN. Surgery this point plans to monitor patient in regards to abscess given his overall improvement. He saw significant NG drainage to NG tube was recommended to be left in for now. Nursing also noted some guarding of the right wrist and pain with movement so x-rays were done which were unremarkable.  Spoke with infectious disease on 12/4. Patient initially on IV Zosyn and vancomycin for aspiration pneumonia and then Zosyn continued for abdominal abscess. Patient has been on enteral vancomycin and IV Flagyl for C. difficile and has completed full course. Infectious disease recommending discontinuation of Zosyn and changing to IV Rocephin until abdominal abscess resolves. They also recommend continuing coverage for C. difficile until IV Rocephin discontinued. They recommend much simpler course using by mouth Flagyl.   Assessment/Plan:  Septic shock:  -Resolved  Diarrhea: - Secondary C. difficile, resolved  Organic brain syndrome (chronic) -Patient is deaf, unable to communicate needs, unresponsive to any sort commands.  ARF (acute renal failure): - Felt to be secondary to hypotension from septic shock. Resolved  Secondary hypotension:  -Resolved, secondary to septic shock  SBO (small bowel obstruction):  -Recent self DC'd  NG tube on 12/5  HTN (hypertension):  -Stable. -Continue IV medication; if/when patient can pass swallow tests could convert medication to PO  Clostridium difficile colitis:  -Stable. - See above for antibiotic coverage; NOTE unsure how well patient can swallow medication will change metronidazole back to IV -If/when patient can pass swallow tests will convert medications  to PO  Aspiration pneumonia:  -Followup chest x-ray looks more like edema rather than pneumonia.  -BNP was normal.  -See  above for antibiotic coverage  Acute respiratory failure with hypoxia:  -Resolved, secondary to aspiration pneumonia  Left lower quadrant abscess:  -Continue to observe as per surgery, see above for antibiotic coverage  Pneumoperitoneum: Looks to be self improved   Code Status: Full code Family Communication: Have left message with contact from group home yesterday Disposition Plan: Transfer to floor, hopefully start by mouth soon, once NG tube resolved   Consultants:  Critical Care  General surgery  Procedures:  Intubation the patient 11/22-12/1  Placement of left IJ central line 11/22  Right wrist x-ray 12/18/2012 IMPRESSION:  No acute bony abnormality of the right wrist is demonstrated on this  limited two view series    Antibiotics: IV Flagyl 11/20 >> 12/4 Zosyn 11/20 >> 12/4 Enteral Vancomycin 11/20 >> 12/30 IV Vancomycin 11/24 >> 12/01  Micafungin 11/26 >> 12/4  Flagyl po 12/4 >> stopped 12/5 Metronidazole IV 12/5>>> Rocephin IV 12/4 >>       Objective: Filed Vitals:   12/20/12 1518  BP: 123/70  Pulse: 103  Temp: 98.5 F (36.9 C)  Resp: 20    Intake/Output Summary (Last 24 hours) at 12/20/12 1657 Last data filed at 12/20/12 1500  Gross per 24 hour  Intake 1617.95 ml  Output   2000 ml  Net -382.05 ml   Filed Weights   12/18/12 1323 12/19/12 0654 12/20/12 0602  Weight: 76.2 kg (167 lb 15.9 oz) 77 kg (169 lb 12.1 oz) 75.6 kg (166 lb 10.7 oz)    Exam:   General:  Appears in no acute distress, patient has chronic mental retardation, does not respond to commands does not respond to exam. Does respond to painful stimuli  Cardiovascular: Regular rate and rhythm, S1-S2  Respiratory: Clear to auscultation bilaterally, limited inspiration  Abdomen: Soft, non-tender, positive bowel sounds, nondistended  Musculoskeletal: No clubbing or cyanosis or edema  Data Reviewed: Basic Metabolic Panel:  Recent Labs Lab 12/16/12 0628  12/17/12 0400 12/18/12 0300 12/19/12 0416 12/20/12 0444  NA 131* 132* 131* 132* 133*  K 4.4 3.7 4.0 4.3 4.5  CL 97 97 99 100 100  CO2 25 25 23 23 23   GLUCOSE 115* 114* 130* 129* 144*  BUN 27* 27* 27* 26* 28*  CREATININE 0.98 1.05 1.00 1.00 0.96  CALCIUM 7.9* 7.9* 7.9* 7.9* 8.1*  MG 2.1  --   --  2.4  --   PHOS 3.4  --   --  3.4  --    Liver Function Tests:  Recent Labs Lab 12/16/12 0628 12/19/12 0416  AST 30 19  ALT 14 12  ALKPHOS 71 84  BILITOT 0.5 0.5  PROT 6.4 6.9  ALBUMIN 2.2* 2.4*   No results found for this basename: LIPASE, AMYLASE,  in the last 168 hours No results found for this basename: AMMONIA,  in the last 168 hours CBC:  Recent Labs Lab 12/15/12 0520 12/16/12 0628 12/18/12 0800 12/19/12 0416  WBC 16.3* 16.6* 12.7* 14.0*  NEUTROABS  --  13.0*  --   --   HGB 12.8* 12.7* 12.4* 12.6*  HCT 37.1* 36.5* 36.4* 36.7*  MCV 83.7 83.1 84.3 83.8  PLT 356 405* 529* 601*   Cardiac Enzymes: No results found for this basename: CKTOTAL, CKMB, CKMBINDEX, TROPONINI,  in the last 168 hours BNP (last 3 results)  Recent Labs  12/07/12 0335 12/18/12 0300  PROBNP 680.8* 53.8   CBG:  Recent Labs Lab 12/19/12 0622 12/19/12 1408 12/19/12 2228 12/20/12 0552 12/20/12 1433  GLUCAP 125* 127* 140* 143* 133*    No results found for this or any previous visit (from the past 240 hour(s)).   Studies: No results found.  Scheduled Meds: . antiseptic oral rinse  1 application Mouth Rinse QID  . cefTRIAXone (ROCEPHIN)  IV  1 g Intravenous Q24H  . chlorhexidine  15 mL Mouth/Throat BID  . famotidine (PEPCID) IV  20 mg Intravenous Q24H  . heparin subcutaneous  5,000 Units Subcutaneous Q8H  . insulin aspart  0-9 Units Subcutaneous Q8H  . metroNIDAZOLE  500 mg Oral Q8H   Continuous Infusions: . Marland KitchenTPN (CLINIMIX-E) Adult 83 mL/hr at 12/19/12 1824   And  . fat emulsion 250 mL (12/19/12 1824)  . Marland KitchenTPN (CLINIMIX-E) Adult     And  . fat emulsion      Principal  Problem:   Septic shock Active Problems:   Diarrhea   Organic brain syndrome (chronic)   Deaf   ARF (acute renal failure)   Secondary hypotension   SBO (small bowel obstruction)   HTN (hypertension)   Neuropathy   Clostridium difficile colitis   Wheezes   Acute respiratory failure with hypoxia   Pneumoperitoneum of unknown etiology   Intra-abdominal abscess    Time spent: 50 minutes    WOODS, CURTIS, J  [facility] Pager [phone]. If 7PM-7AM, please contact night-coverage at [email or web address], password [email or web address] 12/20/2012, 4:57 PM  LOS: 15 days

## 2012-12-21 DIAGNOSIS — R7989 Other specified abnormal findings of blood chemistry: Secondary | ICD-10-CM | POA: Diagnosis present

## 2012-12-21 DIAGNOSIS — D75838 Other thrombocytosis: Secondary | ICD-10-CM | POA: Diagnosis present

## 2012-12-21 LAB — COMPREHENSIVE METABOLIC PANEL
ALT: 12 U/L (ref 0–53)
AST: 22 U/L (ref 0–37)
BUN: 29 mg/dL — ABNORMAL HIGH (ref 6–23)
CO2: 23 mEq/L (ref 19–32)
Calcium: 8.3 mg/dL — ABNORMAL LOW (ref 8.4–10.5)
Chloride: 102 mEq/L (ref 96–112)
Creatinine, Ser: 0.94 mg/dL (ref 0.50–1.35)
GFR calc Af Amer: >90 mL/min (ref >90)
GFR calc non Af Amer: 85 mL/min — ABNORMAL LOW (ref >90)
Glucose, Bld: 141 mg/dL — ABNORMAL HIGH (ref 70–99)
Sodium: 134 mEq/L — ABNORMAL LOW (ref 135–145)
Total Bilirubin: 0.4 mg/dL (ref 0.3–1.2)
Total Protein: 7.2 g/dL (ref 6.0–8.3)

## 2012-12-21 LAB — CBC WITH DIFFERENTIAL/PLATELET
Basophils Absolute: 0.1 10*3/uL (ref 0.0–0.1)
Eosinophils Absolute: 0.1 10*3/uL (ref 0.0–0.7)
Hemoglobin: 12.3 g/dL — ABNORMAL LOW (ref 13.0–17.0)
Lymphs Abs: 1.4 10*3/uL (ref 0.7–4.0)
MCH: 29 pg (ref 26.0–34.0)
Monocytes Absolute: 2.4 10*3/uL — ABNORMAL HIGH (ref 0.1–1.0)
Monocytes Relative: 16 % — ABNORMAL HIGH (ref 3–12)
Neutro Abs: 11 10*3/uL — ABNORMAL HIGH (ref 1.7–7.7)
Neutrophils Relative %: 74 % (ref 43–77)
Platelets: 684 10*3/uL — ABNORMAL HIGH (ref 150–400)
RBC: 4.24 MIL/uL (ref 4.22–5.81)
WBC: 15 10*3/uL — ABNORMAL HIGH (ref 4.0–10.5)

## 2012-12-21 LAB — GLUCOSE, CAPILLARY
Glucose-Capillary: 119 mg/dL — ABNORMAL HIGH (ref 70–99)
Glucose-Capillary: 114 mg/dL — ABNORMAL HIGH (ref 70–99)
Glucose-Capillary: 121 mg/dL — ABNORMAL HIGH (ref 70–99)

## 2012-12-21 LAB — MAGNESIUM: Magnesium: 2.3 mg/dL (ref 1.5–2.5)

## 2012-12-21 MED ORDER — FAT EMULSION 20 % IV EMUL
250.0000 mL | INTRAVENOUS | Status: AC
  Administered 2012-12-21: 18:00:00 250 mL via INTRAVENOUS
  Filled 2012-12-21: qty 250

## 2012-12-21 MED ORDER — TRACE MINERALS CR-CU-F-FE-I-MN-MO-SE-ZN IV SOLN
INTRAVENOUS | Status: AC
  Administered 2012-12-21: 18:00:00 via INTRAVENOUS
  Filled 2012-12-21: qty 2000

## 2012-12-21 NOTE — Progress Notes (Addendum)
[facility] PROGRESS NOTE  Victor Little WJX:[id number] DOB: 01/19/1945 DOA: 12/05/2012 PCP: Pcp Not In System  HPI/Subjective:  67 yo WM PMHx deafness, mental retardation from a group home who presented on 11/20 to the emergency room after multiple episodes of diarrhea. Patient was noted to have a a markedly elevated white blood cell count, acute renal failure and signs consistent with septic shock likely from C. difficile colitis. He was also noted to have a small bowel obstruction an NG tube was placed. Cultures came back soon after positive for C. difficile and she was admitted to the ICU started on IV Flagyl and by mouth vancomycin enemas.   By 11/22, patient had a vomiting episode which led to aspiration and secondary respiratory failure and intubation. At this point, critical care became the primary attending. Patient was continued on antibiotics plus respirator. And then on 11/25, and abdominal x-ray following a small bowel obstruction also noted air-fluid levels. General surgery was consulted and a CT of the abdomen and pelvis noted pneumoperitoneum. Patient was watched closely and vital signs and lab work all looks to be improving with a benign abdominal exam and was felt likely patient had a perforation that had sealed in the past. On 11/26, patient had low-grade fevers, so antifungal agent, miacalcin, was added for antifungal prevention. Followup CT of abdomen and pelvis done 11/30 noted increased bilateral pleural effusions with present right middle and lower lobe pneumonia and decreased pneumoperitoneum. Patient is also noted to have 3.7 cm abscess in the left lower quadrant.  On 12/1, patient was able to be extubated and weaned down to minimal oxygen. His acute renal failure which is felt to be secondary to septic shock and hypotension had since resolved as well. His white blood cell count has continued to improve. He was transferred to the hospitalist service after 12/2 Because of  prolonged ventilator support and current n.p.o. status given bowel issues, TPN has been recommended. On 12/3, hospitalist attending has ordered a PICC line and pharmacy for TPN. Surgery this point plans to monitor patient in regards to abscess given his overall improvement. He saw significant NG drainage to NG tube was recommended to be left in for now. Nursing also noted some guarding of the right wrist and pain with movement so x-rays were done which were unremarkable.  Spoke with infectious disease on 12/4. Patient initially on IV Zosyn and vancomycin for aspiration pneumonia and then Zosyn continued for abdominal abscess. Patient has been on enteral vancomycin and IV Flagyl for C. difficile and has completed full course. Infectious disease recommending discontinuation of Zosyn and changing to IV Rocephin until abdominal abscess resolves. They also recommend continuing coverage for C. difficile until IV Rocephin discontinued. They recommend much simpler course using by mouth Flagyl. 12/20/2012, Patient is deaf, unable to communicate needs, unresponsive to any sort of commands. Patient does respond to painful stimuli and appears to be distressed when head of bed moved. TODAY patient still unable to communicate, deaf, unresponsive to commands. Patient spontaneously opens eyes and moves head and upper extremities. Patient responds to painful stimuli.     Assessment/Plan:  Septic shock:  -Resolved  Diarrhea: - Secondary C. difficile, resolved  Organic brain syndrome (chronic) -Patient is deaf, unable to communicate needs, unresponsive to any sort commands.  ARF (acute renal failure): - Felt to be secondary to hypotension from septic shock. Resolved  Secondary hypotension:  -Resolved, secondary to septic shock  SBO (small bowel obstruction):  -Recent self DC'd  NG  tube on 12/5  HTN (hypertension):  -Stable. -Continue IV medication; if/when patient can pass swallow tests could convert  medication to PO  Clostridium difficile colitis:  -Stable. - See above for antibiotic coverage; NOTE unsure how well patient can swallow medication will change metronidazole back to IV -If/when patient can pass swallow tests will convert medications to PO  Aspiration pneumonia:  -Followup chest x-ray looks more like edema rather than pneumonia.  -BNP was normal.  -See above for antibiotic coverage  Acute respiratory failure with hypoxia:  -Resolved, secondary to aspiration pneumonia  Left lower quadrant abscess:  -Continue to observe as per surgery, see above for antibiotic coverage -If in the a.m. patient's WBC continue to increase, and patient continues to have increased tachycardia will obtain new CT abdomen/pelvis with contrast  Pneumoperitoneum: Looks to be self improved  Thrombocytosis -Reactive vs infection  Code Status: Full code Family Communication: Have left message with contact from group home yesterday Disposition Plan: Transfer to floor, hopefully start by mouth soon, once NG tube resolved   Consultants:  Critical Care  General surgery  Procedures:  Intubation the patient 11/22-12/1  Placement of left IJ central line 11/22  Right wrist x-ray 12/18/2012 IMPRESSION:  No acute bony abnormality of the right wrist is demonstrated on this  limited two view series    Antibiotics: IV Flagyl 11/20 >> 12/4 Zosyn 11/20 >> 12/4 Enteral Vancomycin 11/20 >> 12/30 IV Vancomycin 11/24 >> 12/01  Micafungin 11/26 >> 12/4  Flagyl po 12/4 >> stopped 12/5 Metronidazole IV 12/5>>> Rocephin IV 12/4 >>       Objective: Filed Vitals:   12/21/12 1514  BP: 150/83  Pulse: 96  Temp: 98.6 F (37 C)  Resp: 18    Intake/Output Summary (Last 24 hours) at 12/21/12 1525 Last data filed at 12/21/12 1342  Gross per 24 hour  Intake 1677.15 ml  Output   1025 ml  Net 652.15 ml   Filed Weights   12/19/12 0654 12/20/12 0602 12/21/12 0500  Weight: 77 kg (169 lb  12.1 oz) 75.6 kg (166 lb 10.7 oz) 75.6 kg (166 lb 10.7 oz)    Exam:   General:  Appears in no acute distress, patient has chronic mental retardation, does not respond to commands. Does respond to painful stimuli  Cardiovascular: Regular rate and rhythm, S1-S2  Respiratory: Clear to auscultation bilaterally, limited inspiration  Abdomen: Soft, tender left lower quadrant/right lower quadrant (patient grimaces when palpated), positive hyperactive bowel sounds, nondistended  Musculoskeletal: No clubbing or cyanosis or edema, right arm double-lumen PICC line for clean negative sign of infection  Data Reviewed: Basic Metabolic Panel:  Recent Labs Lab 12/16/12 0628 12/17/12 0400 12/18/12 0300 12/19/12 0416 12/20/12 0444 12/21/12 0600  NA 131* 132* 131* 132* 133* 134*  K 4.4 3.7 4.0 4.3 4.5 4.7  CL 97 97 99 100 100 102  CO2 25 25 23 23 23 23   GLUCOSE 115* 114* 130* 129* 144* 141*  BUN 27* 27* 27* 26* 28* 29*  CREATININE 0.98 1.05 1.00 1.00 0.96 0.94  CALCIUM 7.9* 7.9* 7.9* 7.9* 8.1* 8.3*  MG 2.1  --   --  2.4  --  2.3  PHOS 3.4  --   --  3.4  --   --    Liver Function Tests:  Recent Labs Lab 12/16/12 0628 12/19/12 0416 12/21/12 0600  AST 30 19 22   ALT 14 12 12   ALKPHOS 71 84 94  BILITOT 0.5 0.5 0.4  PROT 6.4 6.9  7.2  ALBUMIN 2.2* 2.4* 2.3*   No results found for this basename: LIPASE, AMYLASE,  in the last 168 hours No results found for this basename: AMMONIA,  in the last 168 hours CBC:  Recent Labs Lab 12/15/12 0520 12/16/12 0628 12/18/12 0800 12/19/12 0416 12/21/12 0600  WBC 16.3* 16.6* 12.7* 14.0* 15.0*  NEUTROABS  --  13.0*  --   --  11.0*  HGB 12.8* 12.7* 12.4* 12.6* 12.3*  HCT 37.1* 36.5* 36.4* 36.7* 36.0*  MCV 83.7 83.1 84.3 83.8 84.9  PLT 356 405* 529* 601* 684*   Cardiac Enzymes: No results found for this basename: CKTOTAL, CKMB, CKMBINDEX, TROPONINI,  in the last 168 hours BNP (last 3 results)  Recent Labs  12/07/12 0335 12/18/12 0300   PROBNP 680.8* 53.8   CBG:  Recent Labs Lab 12/20/12 0552 12/20/12 1433 12/20/12 2140 12/21/12 0609 12/21/12 1337  GLUCAP 143* 133* 123* 119* 114*    No results found for this or any previous visit (from the past 240 hour(s)).   Studies: No results found.  Scheduled Meds: . antiseptic oral rinse  1 application Mouth Rinse QID  . cefTRIAXone (ROCEPHIN)  IV  1 g Intravenous Q24H  . chlorhexidine  15 mL Mouth/Throat BID  . famotidine (PEPCID) IV  20 mg Intravenous Q24H  . heparin subcutaneous  5,000 Units Subcutaneous Q8H  . insulin aspart  0-9 Units Subcutaneous Q8H  . metronidazole  500 mg Intravenous Q8H   Continuous Infusions: . Marland KitchenTPN (CLINIMIX-E) Adult 83 mL/hr at 12/20/12 1717   And  . fat emulsion 250 mL (12/20/12 1717)  . Marland KitchenTPN (CLINIMIX-E) Adult     And  . fat emulsion      Principal Problem:   Septic shock Active Problems:   Diarrhea   Organic brain syndrome (chronic)   Deaf   ARF (acute renal failure)   Secondary hypotension   SBO (small bowel obstruction)   HTN (hypertension)   Neuropathy   Clostridium difficile colitis   Wheezes   Acute respiratory failure with hypoxia   Pneumoperitoneum of unknown etiology   Intra-abdominal abscess    Time spent: 50 minutes    Yazmeen Woolf, J  [facility] Pager [phone]. If 7PM-7AM, please contact night-coverage at [email or web address], password [facility] 12/21/2012, 3:25 PM  LOS: 16 days

## 2012-12-21 NOTE — Progress Notes (Signed)
PARENTERAL NUTRITION CONSULT NOTE - Follow-up  Pharmacy Consult for TNA Indication: SBO vs ileus  No Known Allergies  Patient Measurements: Height: 6' (182.9 cm) Weight: 166 lb 10.7 oz (75.6 kg) IBW/kg (Calculated) : 77.6 Adjusted Body Weight: 81 kg Weight PTA: 92 kg  Vital Signs: Temp: 98.5 F (36.9 C) (12/06 0611) Temp src: Axillary (12/06 0611) BP: 144/80 mmHg (12/06 0611) Pulse Rate: 94 (12/06 0611) Intake/Output from previous day: 12/05 0701 - 12/06 0700 In: 2432 [IV Piggyback:200; ZOX:0960] Out: 1150 [Urine:1000; Emesis/NG output:150]  Labs:  Recent Labs  12/18/12 0800 12/19/12 0416 12/21/12 0600  WBC 12.7* 14.0* 15.0*  HGB 12.4* 12.6* 12.3*  HCT 36.4* 36.7* 36.0*  PLT 529* 601* 684*     Recent Labs  12/19/12 0416 12/20/12 0444 12/21/12 0600  NA 132* 133* 134*  K 4.3 4.5 4.7  CL 100 100 102  CO2 23 23 23   GLUCOSE 129* 144* 141*  BUN 26* 28* 29*  CREATININE 1.00 0.96 0.94  CALCIUM 7.9* 8.1* 8.3*  MG 2.4  --  2.3  PHOS 3.4  --   --   PROT 6.9  --  7.2  ALBUMIN 2.4*  --  2.3*  AST 19  --  22  ALT 12  --  12  ALKPHOS 84  --  94  BILITOT 0.5  --  0.4  Corrected Ca on 12/6 = 9.7 Estimated Creatinine Clearance: 82.7 ml/min (by C-G formula based on Cr of 0.94).    Recent Labs  12/20/12 1433 12/20/12 2140 12/21/12 0609  GLUCAP 133* 123* 119*   Medications:  Infusions:  . Marland KitchenTPN (CLINIMIX-E) Adult 83 mL/hr at 12/20/12 1717   And  . fat emulsion 250 mL (12/20/12 1717)   Insulin Requirements in the past 24 hours:   No hx of DM  2 units Novolog required - on sensitive SSI q8h  Nutritional Goals/ 24 hr:   RD recommendations (updated 12/3):  2100-2300 KCal/day, 90-105 grams protein/day, Fluid 2.1-2.3 L/day   Goal rate Clinimix E 5/20 of 38ml/hr + 20% lipids at 71ml/hr to deliver 100g protein and 2233 KCal per 24 hr.  Current Nutrition:   Clinimix  E 5/20 46ml/hr + 20% lipids 71ml/hr.  NPO  IVF: None (saline lock)  Assessment:  67  yo with hx HTN, mental retardation, deafness admitted from group home 11/20 s/p 12 hours of vomiting, diarrhea, hypotension, and c/o RLQ pain.  +Cdiff.  Pneumoperitoneum on CT, probable ruptured viscus, ileus vs partial SBO, colitis.  Tube feed not begun d/t suspected GI perf, began TNA 11/27.  CT showed improvement but new small abscess on 11/30.  12/6:   Pt tolerating TPN without problems.  Labs are stable.  NGT output 150 / 24hr yesterday.  Per surgery, for swallowing study then consider trial of clear liquids.  Labs  Glucose: at goal < 150  Electrolytes: Na slightly low (unable to adjust in premixed formula), others WNL. Renal: SCr stable and wnl. UOP 0.6 ml/kg/hr. LFTs: below ULN  TGs: wnl, 95 (12/1) Prealbumin: improving, 7.8 (11/28), 17.5 (12/1)  TPN Access: Triple lumen PICC placed 12/3 (TL CVC removed 12/3) TPN day#: 10  Plan:    Continue Clinimix E 5/20 at goal rate of 83 ml/hr.  TNA to contain standard multivitamins and trace elements.  Cont lipids 20% at 10 ml/hr.  Continue SSI sensitive scale q8h  TNA lab panels on Mondays & Thursdays.  Elie Goody, PharmD, BCPS Pager: [phone] 12/21/2012  8:03 AM

## 2012-12-21 NOTE — Progress Notes (Signed)
Patient ID: Victor Little, male   DOB: June 30, 1945, 68 y.o.   MRN: [id number] [facility] Progress Note:   * No surgery found *  Subjective: Mental status is unchanged; difficult to assess because of deafness Objective: Vital signs in last 24 hours: Temp:  [97.4 F (36.3 C)-98.5 F (36.9 C)] 98.5 F (36.9 C) (12/06 0611) Pulse Rate:  [94-103] 94 (12/06 0611) Resp:  [18-20] 18 (12/06 0611) BP: (123-144)/(66-80) 144/80 mmHg (12/06 0611) SpO2:  [95 %-100 %] 95 % (12/06 0611) Weight:  [166 lb 10.7 oz (75.6 kg)] 166 lb 10.7 oz (75.6 kg) (12/06 0500)  Intake/Output from previous day: 12/05 0701 - 12/06 0700 In: 2432 [IV Piggyback:200; WUJ:8119] Out: 1150 [Urine:1000; Emesis/NG output:150] Intake/Output this shift:    Physical Exam: Work of breathing is not labored.  No grimace with palpation of the abdomen  Lab Results:  Results for orders placed during the hospital encounter of 12/05/12 (from the past 48 hour(s))  GLUCOSE, CAPILLARY     Status: Abnormal   Collection Time    12/19/12  2:08 PM      Result Value Range   Glucose-Capillary 127 (*) 70 - 99 mg/dL  GLUCOSE, CAPILLARY     Status: Abnormal   Collection Time    12/19/12 10:28 PM      Result Value Range   Glucose-Capillary 140 (*) 70 - 99 mg/dL  BASIC METABOLIC PANEL     Status: Abnormal   Collection Time    12/20/12  4:44 AM      Result Value Range   Sodium 133 (*) 135 - 145 mEq/L   Potassium 4.5  3.5 - 5.1 mEq/L   Chloride 100  96 - 112 mEq/L   CO2 23  19 - 32 mEq/L   Glucose, Bld 144 (*) 70 - 99 mg/dL   BUN 28 (*) 6 - 23 mg/dL   Creatinine, Ser 1.47  0.50 - 1.35 mg/dL   Calcium 8.1 (*) 8.4 - 10.5 mg/dL   GFR calc non Af Amer 84 (*) >90 mL/min   GFR calc Af Amer >90  >90 mL/min   Comment: (NOTE)     The eGFR has been calculated using the CKD EPI equation.     This calculation has not been validated in all clinical situations.     eGFR's persistently <90 mL/min signify possible Chronic Kidney   Disease.  GLUCOSE, CAPILLARY     Status: Abnormal   Collection Time    12/20/12  5:52 AM      Result Value Range   Glucose-Capillary 143 (*) 70 - 99 mg/dL  GLUCOSE, CAPILLARY     Status: Abnormal   Collection Time    12/20/12  2:33 PM      Result Value Range   Glucose-Capillary 133 (*) 70 - 99 mg/dL  GLUCOSE, CAPILLARY     Status: Abnormal   Collection Time    12/20/12  9:40 PM      Result Value Range   Glucose-Capillary 123 (*) 70 - 99 mg/dL  COMPREHENSIVE METABOLIC PANEL     Status: Abnormal   Collection Time    12/21/12  6:00 AM      Result Value Range   Sodium 134 (*) 135 - 145 mEq/L   Potassium 4.7  3.5 - 5.1 mEq/L   Chloride 102  96 - 112 mEq/L   CO2 23  19 - 32 mEq/L   Glucose, Bld 141 (*) 70 - 99 mg/dL   BUN  29 (*) 6 - 23 mg/dL   Creatinine, Ser 1.61  0.50 - 1.35 mg/dL   Calcium 8.3 (*) 8.4 - 10.5 mg/dL   Total Protein 7.2  6.0 - 8.3 g/dL   Albumin 2.3 (*) 3.5 - 5.2 g/dL   AST 22  0 - 37 U/L   ALT 12  0 - 53 U/L   Alkaline Phosphatase 94  39 - 117 U/L   Total Bilirubin 0.4  0.3 - 1.2 mg/dL   GFR calc non Af Amer 85 (*) >90 mL/min   GFR calc Af Amer >90  >90 mL/min   Comment: (NOTE)     The eGFR has been calculated using the CKD EPI equation.     This calculation has not been validated in all clinical situations.     eGFR's persistently <90 mL/min signify possible Chronic Kidney     Disease.  CBC WITH DIFFERENTIAL     Status: Abnormal   Collection Time    12/21/12  6:00 AM      Result Value Range   WBC 15.0 (*) 4.0 - 10.5 K/uL   RBC 4.24  4.22 - 5.81 MIL/uL   Hemoglobin 12.3 (*) 13.0 - 17.0 g/dL   HCT 09.6 (*) 04.5 - 40.9 %   MCV 84.9  78.0 - 100.0 fL   MCH 29.0  26.0 - 34.0 pg   MCHC 34.2  30.0 - 36.0 g/dL   RDW 81.1  91.4 - 78.2 %   Platelets 684 (*) 150 - 400 K/uL   Neutrophils Relative % 74  43 - 77 %   Neutro Abs 11.0 (*) 1.7 - 7.7 K/uL   Lymphocytes Relative 9 (*) 12 - 46 %   Lymphs Abs 1.4  0.7 - 4.0 K/uL   Monocytes Relative 16 (*) 3 - 12 %    Monocytes Absolute 2.4 (*) 0.1 - 1.0 K/uL   Eosinophils Relative 1  0 - 5 %   Eosinophils Absolute 0.1  0.0 - 0.7 K/uL   Basophils Relative 1  0 - 1 %   Basophils Absolute 0.1  0.0 - 0.1 K/uL  MAGNESIUM     Status: None   Collection Time    12/21/12  6:00 AM      Result Value Range   Magnesium 2.3  1.5 - 2.5 mg/dL  GLUCOSE, CAPILLARY     Status: Abnormal   Collection Time    12/21/12  6:09 AM      Result Value Range   Glucose-Capillary 119 (*) 70 - 99 mg/dL    Radiology/Results: No results found.  Anti-infectives: Anti-infectives   Start     Dose/Rate Route Frequency Ordered Stop   12/20/12 2200  metroNIDAZOLE (FLAGYL) IVPB 500 mg     500 mg 100 mL/hr over 60 Minutes Intravenous Every 8 hours 12/20/12 2151     12/19/12 1600  metroNIDAZOLE (FLAGYL) 50 mg/ml oral suspension 500 mg  Status:  Discontinued     500 mg Oral Every 8 hours 12/19/12 1541 12/20/12 2151   12/19/12 1600  cefTRIAXone (ROCEPHIN) 1 g in dextrose 5 % 50 mL IVPB     1 g 100 mL/hr over 30 Minutes Intravenous Every 24 hours 12/19/12 1541     12/13/12 1200  vancomycin (VANCOCIN) IVPB 1000 mg/200 mL premix  Status:  Discontinued     1,000 mg 200 mL/hr over 60 Minutes Intravenous Every 12 hours 12/13/12 0531 12/16/12 0938   12/11/12 1700  micafungin (MYCAMINE) 100 mg in sodium  chloride 0.9 % 100 mL IVPB  Status:  Discontinued     100 mg 100 mL/hr over 1 Hours Intravenous Every 24 hours 12/11/12 1639 12/19/12 1542   12/11/12 1645  micafungin (MYCAMINE) 100 mg in sodium chloride 0.9 % 100 mL IVPB  Status:  Discontinued     100 mg 100 mL/hr over 1 Hours Intravenous Daily 12/11/12 1639 12/11/12 1643   12/10/12 1200  vancomycin (VANCOCIN) 1,250 mg in sodium chloride 0.9 % 250 mL IVPB  Status:  Discontinued     1,250 mg 166.7 mL/hr over 90 Minutes Intravenous Every 12 hours 12/10/12 1027 12/13/12 0530   12/09/12 1200  vancomycin (VANCOCIN) 50 mg/mL oral solution 500 mg     500 mg Per Tube 4 times per day 12/09/12 1142  12/18/12 2359   12/06/12 0600  vancomycin (VANCOCIN) 1,250 mg in sodium chloride 0.9 % 250 mL IVPB  Status:  Discontinued     1,250 mg 166.7 mL/hr over 90 Minutes Intravenous Every 24 hours 12/05/12 1006 12/05/12 1628   12/05/12 1800  vancomycin (VANCOCIN) 500 mg in sodium chloride irrigation 0.9 % 100 mL ENEMA  Status:  Discontinued     500 mg Rectal 4 times per day 12/05/12 1424 12/11/12 1037   12/05/12 1600  metroNIDAZOLE (FLAGYL) IVPB 500 mg     500 mg 100 mL/hr over 60 Minutes Intravenous Every 8 hours 12/05/12 1424 12/18/12 1909   12/05/12 1430  vancomycin (VANCOCIN) 50 mg/mL oral solution 500 mg  Status:  Discontinued     500 mg Per Tube 4 times per day 12/05/12 1424 12/05/12 1425   12/05/12 1015  piperacillin-tazobactam (ZOSYN) IVPB 3.375 g  Status:  Discontinued     3.375 g 12.5 mL/hr over 240 Minutes Intravenous Every 8 hours 12/05/12 1006 12/19/12 1541   12/05/12 0845  vancomycin (VANCOCIN) IVPB 1000 mg/200 mL premix     1,000 mg 200 mL/hr over 60 Minutes Intravenous STAT 12/05/12 0830 12/05/12 1017   12/05/12 0830  piperacillin-tazobactam (ZOSYN) IVPB 3.375 g     3.375 g 100 mL/hr over 30 Minutes Intravenous STAT 12/05/12 0806 12/05/12 1017   12/05/12 0815  piperacillin-tazobactam (ZOSYN) IVPB 3.375 g  Status:  Discontinued     3.375 g 12.5 mL/hr over 240 Minutes Intravenous  Once 12/05/12 0804 12/05/12 0805   12/05/12 0815  vancomycin (VANCOCIN) IVPB 1000 mg/200 mL premix  Status:  Discontinued     1,000 mg 200 mL/hr over 60 Minutes Intravenous  Once 12/05/12 0813 12/05/12 0813      Assessment/Plan: Problem List: Patient Active Problem List   Diagnosis Date Noted  . Pneumoperitoneum of unknown etiology 12/19/2012  . Intra-abdominal abscess 12/19/2012  . Acute respiratory failure with hypoxia 12/08/2012  . Wheezes 12/07/2012  . Septic shock 12/05/2012  . Diarrhea 12/05/2012  . 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 Deaf 12/05/2012  . ARF (acute renal  failure) 12/05/2012  . Secondary hypotension 12/05/2012  . SBO (small bowel obstruction) 12/05/2012  . HTN (hypertension) 12/05/2012  . Neuropathy 12/05/2012  . Clostridium difficile colitis 12/05/2012    No surgical issues at this time.  Surgery will sign off.   * No surgery found *    LOS: 16 days   Matt B. Daphine Deutscher, MD, [facility] [phone] beeper [phone]  12/21/2012 10:27 AM

## 2012-12-21 NOTE — Progress Notes (Signed)
SLP Cancellation Note  Patient Details Name: Victor Little MRN: [id number] DOB: 1945/12/31   Cancelled evaluation:       Order received for swallow evaluation; chart reviewed at length.  Pt non-participatory in assessment.  Pt made eye contact; grimaced with any movement of HOB, appeared to be experiencing pain.  Shook head from side-to-side with all attempts at oral care and to provide ice chips, water.  Attempted multiple times to offer water to no avail.  Plan: Will f/u next date - if there are caregivers to whom Mr. Jokerst responds more amenably, they may want to offer limited sips of water/ice.     Blenda Mounts Laurice 12/21/2012, 2:23 PM

## 2012-12-22 ENCOUNTER — Encounter (HOSPITAL_COMMUNITY): Payer: Self-pay | Admitting: Radiology

## 2012-12-22 ENCOUNTER — Inpatient Hospital Stay (HOSPITAL_COMMUNITY): Payer: PRIVATE HEALTH INSURANCE

## 2012-12-22 LAB — COMPREHENSIVE METABOLIC PANEL
ALT: 11 U/L (ref 0–53)
AST: 20 U/L (ref 0–37)
Albumin: 2.2 g/dL — ABNORMAL LOW (ref 3.5–5.2)
Alkaline Phosphatase: 97 U/L (ref 39–117)
Calcium: 8.4 mg/dL (ref 8.4–10.5)
GFR calc non Af Amer: 84 mL/min — ABNORMAL LOW (ref >90)
Glucose, Bld: 135 mg/dL — ABNORMAL HIGH (ref 70–99)
Potassium: 4.6 mEq/L (ref 3.5–5.1)
Sodium: 134 mEq/L — ABNORMAL LOW (ref 135–145)
Total Protein: 7.2 g/dL (ref 6.0–8.3)

## 2012-12-22 LAB — CBC WITH DIFFERENTIAL/PLATELET
Basophils Relative: 0 % (ref 0–1)
Eosinophils Absolute: 0 10*3/uL (ref 0.0–0.7)
HCT: 36.7 % — ABNORMAL LOW (ref 39.0–52.0)
Hemoglobin: 12.3 g/dL — ABNORMAL LOW (ref 13.0–17.0)
Lymphocytes Relative: 8 % — ABNORMAL LOW (ref 12–46)
Lymphs Abs: 1.3 10*3/uL (ref 0.7–4.0)
MCH: 28.6 pg (ref 26.0–34.0)
MCHC: 33.5 g/dL (ref 30.0–36.0)
Monocytes Absolute: 2.7 10*3/uL — ABNORMAL HIGH (ref 0.1–1.0)
Monocytes Relative: 17 % — ABNORMAL HIGH (ref 3–12)
Neutrophils Relative %: 75 % (ref 43–77)
Platelets: 756 10*3/uL — ABNORMAL HIGH (ref 150–400)
RBC: 4.3 MIL/uL (ref 4.22–5.81)

## 2012-12-22 LAB — URINALYSIS, ROUTINE W REFLEX MICROSCOPIC
Bilirubin Urine: NEGATIVE
Glucose, UA: NEGATIVE mg/dL
Hgb urine dipstick: NEGATIVE
Ketones, ur: NEGATIVE mg/dL
Leukocytes, UA: NEGATIVE
Protein, ur: NEGATIVE mg/dL
Urobilinogen, UA: 0.2 mg/dL (ref 0.0–1.0)
pH: 5.5 (ref 5.0–8.0)

## 2012-12-22 LAB — GLUCOSE, CAPILLARY: Glucose-Capillary: 130 mg/dL — ABNORMAL HIGH (ref 70–99)

## 2012-12-22 MED ORDER — VANCOMYCIN 50 MG/ML ORAL SOLUTION
500.0000 mg | Freq: Four times a day (QID) | ORAL | Status: DC
  Filled 2012-12-22 (×6): qty 10

## 2012-12-22 MED ORDER — TRACE MINERALS CR-CU-F-FE-I-MN-MO-SE-ZN IV SOLN
INTRAVENOUS | Status: AC
  Administered 2012-12-22: 17:00:00 via INTRAVENOUS
  Filled 2012-12-22: qty 2000

## 2012-12-22 MED ORDER — FAT EMULSION 20 % IV EMUL
250.0000 mL | INTRAVENOUS | Status: AC
  Administered 2012-12-22: 17:00:00 250 mL via INTRAVENOUS
  Filled 2012-12-22: qty 250

## 2012-12-22 MED ORDER — IOHEXOL 300 MG/ML  SOLN
100.0000 mL | Freq: Once | INTRAMUSCULAR | Status: AC | PRN
  Administered 2012-12-22: 100 mL via INTRAVENOUS

## 2012-12-22 NOTE — Progress Notes (Signed)
SLP Cancellation Note  Patient Details Name: Victor Little MRN: [id number] DOB: 22-Dec-1945   Cancelled treatment:   ST to complete BSE on 12/23/12.  Moreen Fowler MS, CCC-SLP [phone] [facility] 12/22/2012, 6:13 PM

## 2012-12-22 NOTE — Progress Notes (Signed)
Notified the on call NP for [facility] of orders for [facility] tube insertion ASAP, & PO Vanc orders, she in turned notified Dr Mitchel Honour whom is covering on call for [facility] group. Discussed with her the problem with givning po vanc due to risk of aspiration, & that nobody here knows about & what a [place] Tube is, we even googled it, & shows to be done under intubation, & will not be done tonight anyway on this unit, by nsg staff, & Dr Joseph Art is not obtainable at this time, therefore conclusion of Dr Jomarie Longs was to hold the po Vancomycin tonight & discuss with Dr Joseph Art on rounds tomorrow about inserting an NGT vs a Panda tube for administration of po Vancomycin.

## 2012-12-22 NOTE — Progress Notes (Signed)
PARENTERAL NUTRITION CONSULT NOTE - Follow-up  Pharmacy Consult for TNA Indication: SBO vs ileus  No Known Allergies  Patient Measurements: Height: 6' (182.9 cm) Weight: 166 lb 10.7 oz (75.6 kg) IBW/kg (Calculated) : 77.6 Adjusted Body Weight: 81 kg Weight PTA: 92 kg  Vital Signs: Temp: 98.7 F (37.1 C) (12/06 2155) Temp src: Axillary (12/06 2155) BP: 137/80 mmHg (12/06 2155) Pulse Rate: 87 (12/06 2155) Intake/Output from previous day: 12/06 0701 - 12/07 0700 In: 361.2 [IV Piggyback:200; TPN:161.2] Out: 1600 [Urine:1600]  Labs:  Recent Labs  12/21/12 0600 12/22/12 0332  WBC 15.0* 16.3*  HGB 12.3* 12.3*  HCT 36.0* 36.7*  PLT 684* 756*     Recent Labs  12/20/12 0444 12/21/12 0600 12/22/12 0332  NA 133* 134* 134*  K 4.5 4.7 4.6  CL 100 102 102  CO2 23 23 23   GLUCOSE 144* 141* 135*  BUN 28* 29* 31*  CREATININE 0.96 0.94 0.96  CALCIUM 8.1* 8.3* 8.4  MG  --  2.3 2.2  PROT  --  7.2 7.2  ALBUMIN  --  2.3* 2.2*  AST  --  22 20  ALT  --  12 11  ALKPHOS  --  94 97  BILITOT  --  0.4 0.5  Corrected Ca on 12/7 = 9.8 Estimated Creatinine Clearance: 80.9 ml/min (by C-G formula based on Cr of 0.96).    Recent Labs  12/21/12 0609 12/21/12 1337 12/21/12 2148  GLUCAP 119* 114* 121*   Medications:  Infusions:  . Marland KitchenTPN (CLINIMIX-E) Adult 83 mL/hr at 12/21/12 1809   And  . fat emulsion 250 mL (12/21/12 1809)   Insulin Requirements in the past 24 hours:   No hx of DM  2 units Novolog required - on sensitive SSI q8h  Nutritional Goals/ 24 hr:   RD recommendations (updated 12/3):  2100-2300 KCal/day, 90-105 grams protein/day, Fluid 2.1-2.3 L/day   Goal rate Clinimix E 5/20 of 46ml/hr + 20% lipids at 92ml/hr to deliver 100g protein and 2233 KCal per 24 hr.  Current Nutrition:   Clinimix  E 5/20 87ml/hr + 20% lipids 98ml/hr.  NPO  IVF: None (saline lock)  Assessment:  [name] with hx mental retardation, deafness, HTN, admitted from group home 11/20  after 12 hours of vomiting, diarrhea, hypotension, and c/o RLQ pain.  Stool was +Cdiff.  Pneumoperitoneum on CT, probable ruptured viscus, ileus vs partial SBO, colitis.  Tube feed not begun d/t suspected GI perf, began TNA 11/27.  CT showed improvement but new small abscess on 11/30.  Patient self-removed NGT on 12/5.  12/7:   Pt tolerating TPN without problems.  Labs are stable. Swallowing study attempted yesterday PM but patient reportedly non-participatory and attempts to provide ice chips / water were not successful.  Labs  Glucose: at goal < 150  Electrolytes: Na slightly low (unable to adjust in premixed formula), others WNL. Renal: SCr stable and wnl.  LFTs: below ULN  TGs: wnl, 95 (12/1) Prealbumin: improving, 7.8 (11/28), 17.5 (12/1)  TPN Access: Triple lumen PICC placed 12/3 (TL CVC removed 12/3) TPN day#: 11  Plan:    Continue Clinimix E 5/20 at goal rate of 83 ml/hr.  TNA to contain standard multivitamins and trace elements.  Cont lipids 20% at 10 ml/hr.  Continue SSI sensitive scale q8h  TNA lab panels on Mondays & Thursdays.  Elie Goody, PharmD, BCPS Pager: [phone] 12/22/2012  6:33 AM

## 2012-12-22 NOTE — Progress Notes (Addendum)
[facility] PROGRESS NOTE  BIJAN RIDGLEY ZOX:[id number] DOB: 1945-01-19 DOA: 12/05/2012 PCP: Pcp Not In System  HPI/Subjective:  67 yo WM PMHx deafness, mental retardation from a group home who presented on 11/20 to the emergency room after multiple episodes of diarrhea. Patient was noted to have a a markedly elevated white blood cell count, acute renal failure and signs consistent with septic shock likely from C. difficile colitis. He was also noted to have a small bowel obstruction an NG tube was placed. Cultures came back soon after positive for C. difficile and she was admitted to the ICU started on IV Flagyl and by mouth vancomycin enemas.  By 11/22, patient had a vomiting episode which led to aspiration and secondary respiratory failure and intubation. At this point, critical care became the primary attending. Patient was continued on antibiotics plus respirator. And then on 11/25, and abdominal x-ray following a small bowel obstruction also noted air-fluid levels. General surgery was consulted and a CT of the abdomen and pelvis noted pneumoperitoneum. Patient was watched closely and vital signs and lab work all looks to be improving with a benign abdominal exam and was felt likely patient had a perforation that had sealed in the past. On 11/26, patient had low-grade fevers, so antifungal agent, miacalcin, was added for antifungal prevention. Followup CT of abdomen and pelvis done 11/30 noted increased bilateral pleural effusions with present right middle and lower lobe pneumonia and decreased pneumoperitoneum. Patient is also noted to have 3.7 cm abscess in the left lower quadrant.  On 12/1, patient was able to be extubated and weaned down to minimal oxygen. His acute renal failure which is felt to be secondary to septic shock and hypotension had since resolved as well. His white blood cell count has continued to improve. He was transferred to the hospitalist service after 12/2 Because of  prolonged ventilator support and current n.p.o. status given bowel issues, TPN has been recommended. On 12/3, hospitalist attending has ordered a PICC line and pharmacy for TPN. Surgery this point plans to monitor patient in regards to abscess given his overall improvement. He saw significant NG drainage to NG tube was recommended to be left in for now. Nursing also noted some guarding of the right wrist and pain with movement so x-rays were done which were unremarkable.  Spoke with infectious disease on 12/4. Patient initially on IV Zosyn and vancomycin for aspiration pneumonia and then Zosyn continued for abdominal abscess. Patient has been on enteral vancomycin and IV Flagyl for C. difficile and has completed full course. Infectious disease recommending discontinuation of Zosyn and changing to IV Rocephin until abdominal abscess resolves. They also recommend continuing coverage for C. difficile until IV Rocephin discontinued. They recommend much simpler course using by mouth Flagyl. 12/20/2012, Patient is deaf, unable to communicate needs, unresponsive to any sort of commands. Patient does respond to painful stimuli and appears to be distressed when head of bed moved. 12/21/2012 patient still unable to communicate, deaf, unresponsive to commands. Patient spontaneously opens eyes and moves head and upper extremities. Patient responds to painful stimuli. TODAYpatient still unable to communicate, deaf, unresponsive to commands. Patient does not respond to gentle stimulation however grimaces and raises off the bed with palpation to his left lower/right lower quadrant of the abdomen.      Assessment/Plan:  Septic shock:  -Patient may becoming septic again. Increased WBC, increased pain to abdomen with stimulation. Unfortunately secondary patient's condition unable to obtain feedback from patient -Panculture -Obtain CXR, (extension  of aspiration pneumonia?) -Obtain abdominal/pelvis CT (enlarging  abscess?)  Diarrhea: - Secondary C. difficile, resolved  Organic brain syndrome (chronic) -Patient is deaf, unable to communicate needs, unresponsive to any sort commands.  ARF (acute renal failure): - Felt to be secondary to hypotension from septic shock. Resolved  Secondary hypotension:  -Resolved, secondary to septic shock  SBO (small bowel obstruction):  -Recent self DC'd  NG tube on 12/5  HTN (hypertension):  -Stable. -Continue IV medication; if/when patient can pass swallow tests could convert medication to PO  Clostridium difficile colitis:  -Stable. - See above for antibiotic coverage; NOTE unsure how well patient can swallow medication will change metronidazole back to IV -If/when patient can pass swallow tests will convert medications to PO  Aspiration pneumonia:  -Followup chest x-ray looks more like edema rather than pneumonia.  -BNP was normal.  -See above for antibiotic coverage  Acute respiratory failure with hypoxia:  -Resolved, secondary to aspiration pneumonia  Left lower quadrant abscess:  -Continue to observe as per surgery, see above for antibiotic coverage -patient's WBC have continued to increase, and patient continues to have increased tachycardia will obtain new CT abdomen/pelvis with contrast  Pneumoperitoneum: Looks to be self improved  Thrombocytosis -Reactive vs infection  Code Status: Full code Family Communication: Medical staff attempted to contact group home yesterday; call not returned Disposition Plan: Patient not medically stable   Consultants:  Critical Care  General surgery  Procedures:  Intubation the patient 11/22-12/1  Placement of left IJ central line 11/22  Right wrist x-ray 12/18/2012 IMPRESSION:  No acute bony abnormality of the right wrist is demonstrated on this  limited two view series    Antibiotics: IV Flagyl 11/20 >> 12/4 Zosyn 11/20 >> 12/4 Enteral Vancomycin 11/20 >> 12/30 IV Vancomycin 11/24 >>  12/01  Micafungin 11/26 >> 12/4  Flagyl po 12/4 >> stopped 12/5 Metronidazole IV 12/5>>> Rocephin IV 12/4 >>       Objective: Filed Vitals:   12/22/12 0500  BP: 128/75  Pulse: 81  Temp: 98.4 F (36.9 C)  Resp: 15    Intake/Output Summary (Last 24 hours) at 12/22/12 1432 Last data filed at 12/22/12 0865  Gross per 24 hour  Intake 2616.5 ml  Output   1200 ml  Net 1416.5 ml   Filed Weights   12/20/12 0602 12/21/12 0500 12/22/12 0500  Weight: 75.6 kg (166 lb 10.7 oz) 75.6 kg (166 lb 10.7 oz) 74.9 kg (165 lb 2 oz)    Exam:   General:  acute distress when any portion of his abdomen is palpated, patient has chronic mental retardation, does not respond to commands. Does respond to painful stimuli  Cardiovascular: Regular rate and rhythm, S1-S2  Respiratory: Clear to auscultation bilaterally, limited inspiration  Abdomen: Soft, tender left lower quadrant/right lower quadrant (patient grimaces when palpated), positive hyperactive bowel sounds, mild distention which was not present yesterday   Musculoskeletal: No clubbing or cyanosis or edema, right arm double-lumen PICC line for clean negative sign of infection  Data Reviewed: Basic Metabolic Panel:  Recent Labs Lab 12/16/12 0628  12/18/12 0300 12/19/12 0416 12/20/12 0444 12/21/12 0600 12/22/12 0332  NA 131*  < > 131* 132* 133* 134* 134*  K 4.4  < > 4.0 4.3 4.5 4.7 4.6  CL 97  < > 99 100 100 102 102  CO2 25  < > 23 23 23 23 23   GLUCOSE 115*  < > 130* 129* 144* 141* 135*  BUN 27*  < >  27* 26* 28* 29* 31*  CREATININE 0.98  < > 1.00 1.00 0.96 0.94 0.96  CALCIUM 7.9*  < > 7.9* 7.9* 8.1* 8.3* 8.4  MG 2.1  --   --  2.4  --  2.3 2.2  PHOS 3.4  --   --  3.4  --   --   --   < > = values in this interval not displayed. Liver Function Tests:  Recent Labs Lab 12/16/12 0628 12/19/12 0416 12/21/12 0600 12/22/12 0332  AST 30 19 22 20   ALT 14 12 12 11   ALKPHOS 71 84 94 97  BILITOT 0.5 0.5 0.4 0.5  PROT 6.4 6.9  7.2 7.2  ALBUMIN 2.2* 2.4* 2.3* 2.2*   No results found for this basename: LIPASE, AMYLASE,  in the last 168 hours No results found for this basename: AMMONIA,  in the last 168 hours CBC:  Recent Labs Lab 12/16/12 0628 12/18/12 0800 12/19/12 0416 12/21/12 0600 12/22/12 0332  WBC 16.6* 12.7* 14.0* 15.0* 16.3*  NEUTROABS 13.0*  --   --  11.0* 12.2*  HGB 12.7* 12.4* 12.6* 12.3* 12.3*  HCT 36.5* 36.4* 36.7* 36.0* 36.7*  MCV 83.1 84.3 83.8 84.9 85.3  PLT 405* 529* 601* 684* 756*   Cardiac Enzymes: No results found for this basename: CKTOTAL, CKMB, CKMBINDEX, TROPONINI,  in the last 168 hours BNP (last 3 results)  Recent Labs  12/07/12 0335 12/18/12 0300  PROBNP 680.8* 53.8   CBG:  Recent Labs Lab 12/21/12 0609 12/21/12 1337 12/21/12 2148 12/22/12 0630 12/22/12 0738  GLUCAP 119* 114* 121* 130* 121*    No results found for this or any previous visit (from the past 240 hour(s)).   Studies: No results found.  Scheduled Meds: . antiseptic oral rinse  1 application Mouth Rinse QID  . cefTRIAXone (ROCEPHIN)  IV  1 g Intravenous Q24H  . chlorhexidine  15 mL Mouth/Throat BID  . famotidine (PEPCID) IV  20 mg Intravenous Q24H  . heparin subcutaneous  5,000 Units Subcutaneous Q8H  . insulin aspart  0-9 Units Subcutaneous Q8H  . metronidazole  500 mg Intravenous Q8H   Continuous Infusions: . Marland KitchenTPN (CLINIMIX-E) Adult 83 mL/hr at 12/21/12 1809   And  . fat emulsion 250 mL (12/21/12 1809)  . Marland KitchenTPN (CLINIMIX-E) Adult     And  . fat emulsion      Principal Problem:   Septic shock Active Problems:   Diarrhea   Organic brain syndrome (chronic)   Deaf   ARF (acute renal failure)   Secondary hypotension   SBO (small bowel obstruction)   HTN (hypertension)   Neuropathy   Clostridium difficile colitis   Wheezes   Acute respiratory failure with hypoxia   Pneumoperitoneum of unknown etiology   Intra-abdominal abscess   Secondary thrombocytosis    Time spent: 40  minutes    Sheron Robin, J  [facility] Pager [phone]. If 7PM-7AM, please contact night-coverage at [email or web address], password [facility] 12/22/2012, 2:32 PM  LOS: 17 days

## 2012-12-23 DIAGNOSIS — J189 Pneumonia, unspecified organism: Secondary | ICD-10-CM

## 2012-12-23 DIAGNOSIS — R509 Fever, unspecified: Secondary | ICD-10-CM | POA: Diagnosis not present

## 2012-12-23 DIAGNOSIS — R4701 Aphasia: Secondary | ICD-10-CM | POA: Diagnosis present

## 2012-12-23 DIAGNOSIS — M79601 Pain in right arm: Secondary | ICD-10-CM | POA: Diagnosis not present

## 2012-12-23 LAB — COMPREHENSIVE METABOLIC PANEL
ALT: 9 U/L (ref 0–53)
Alkaline Phosphatase: 90 U/L (ref 39–117)
BUN: 32 mg/dL — ABNORMAL HIGH (ref 6–23)
CO2: 23 mEq/L (ref 19–32)
Chloride: 103 mEq/L (ref 96–112)
GFR calc Af Amer: >90 mL/min (ref >90)
GFR calc non Af Amer: 88 mL/min — ABNORMAL LOW (ref >90)
Glucose, Bld: 122 mg/dL — ABNORMAL HIGH (ref 70–99)
Potassium: 4.5 mEq/L (ref 3.5–5.1)
Sodium: 136 mEq/L (ref 135–145)
Total Bilirubin: 0.4 mg/dL (ref 0.3–1.2)

## 2012-12-23 LAB — CBC WITH DIFFERENTIAL/PLATELET
Basophils Absolute: 0.1 10*3/uL (ref 0.0–0.1)
Basophils Relative: 1 % (ref 0–1)
Eosinophils Relative: 1 % (ref 0–5)
HCT: 35.5 % — ABNORMAL LOW (ref 39.0–52.0)
Hemoglobin: 11.9 g/dL — ABNORMAL LOW (ref 13.0–17.0)
MCHC: 33.5 g/dL (ref 30.0–36.0)
MCV: 86 fL (ref 78.0–100.0)
Monocytes Absolute: 2 10*3/uL — ABNORMAL HIGH (ref 0.1–1.0)
Monocytes Relative: 13 % — ABNORMAL HIGH (ref 3–12)
Neutro Abs: 11.2 10*3/uL — ABNORMAL HIGH (ref 1.7–7.7)
Platelets: 681 10*3/uL — ABNORMAL HIGH (ref 150–400)
RBC: 4.13 MIL/uL — ABNORMAL LOW (ref 4.22–5.81)
RDW: 14.9 % (ref 11.5–15.5)

## 2012-12-23 LAB — GLUCOSE, CAPILLARY: Glucose-Capillary: 121 mg/dL — ABNORMAL HIGH (ref 70–99)

## 2012-12-23 LAB — MAGNESIUM: Magnesium: 2.2 mg/dL (ref 1.5–2.5)

## 2012-12-23 LAB — PREALBUMIN: Prealbumin: 12.7 mg/dL — ABNORMAL LOW (ref 17.0–34.0)

## 2012-12-23 MED ORDER — SODIUM CHLORIDE 0.9 % IV SOLN
INTRAVENOUS | Status: DC
  Administered 2012-12-23: via INTRAVENOUS

## 2012-12-23 MED ORDER — FAT EMULSION 20 % IV EMUL
240.0000 mL | INTRAVENOUS | Status: AC
  Administered 2012-12-23: 240 mL via INTRAVENOUS
  Filled 2012-12-23: qty 250

## 2012-12-23 MED ORDER — TRACE MINERALS CR-CU-F-FE-I-MN-MO-SE-ZN IV SOLN
INTRAVENOUS | Status: AC
  Administered 2012-12-23: 18:00:00 via INTRAVENOUS
  Filled 2012-12-23: qty 2000

## 2012-12-23 NOTE — Progress Notes (Addendum)
Physical Therapy Treatment Patient Details Name: KOLBIE LEPKOWSKI MRN: [id number] DOB: 1945/02/24 Today's Date: 12/23/2012 Time: 7829-5621 PT Time Calculation (min): 17 min  PT Assessment / Plan / Recommendation  History of Present Illness 67 yo male from residential group home with C diff colitis, possible SBO vs ileus and sepsis.  Hx of mental retardation, deaf, mute. 11/22 Aspiration, VDRF, CCS consulted, PCCM assumed care and pt extubated 12/1   PT Comments   Pt required + 2 total assist just to transition from supine to EOB.  Once upright pt required total assist to sit EOB x 8 min as we attempted to get pt to self assist.  Severe posterior lean.  Performed "bear hug" stand pivot sit 1/4 turn from bed to recliner.  Pt assisted 0%.  Repositioned to comfort with pillows.  Hoyer pad in recliner for nursing staff to use lift to assist pt back to bed.   Follow Up Recommendations  SNF if unable to return to his Group Home                           Frequency Min 3X/week   Progress towards PT Goals    Plan      Precautions / Restrictions Hx MR and deaf (non verbal)  Pertinent Vitals/Pain No c/o pain    Mobility  Pt required + 2 total assist to transition from supine to EOB pt offered 0%. Pt sat EOB x 7 min to attempt self righting however demon none with severe posterior lean.  Due to such low performance with bed mobility, performed "Bear Hug" stand pivot transfer 1/4 turn from elevated bed to recliner.  Pt required many pillows to position upright in recliner.   PT Goals (current goals can now be found in the care plan section)    Visit Information  Last PT Received On: 12/23/12 Assistance Needed: +2 History of Present Illness: 67 yo male from residential group home with C diff colitis, possible SBO vs ileus and sepsis.  Hx of mental retardation, deaf, mute. 11/22 Aspiration, VDRF, CCS consulted, PCCM assumed care and pt extubated 12/1    Subjective Data      Cognition      Balance     End of Session Pt in recliner with Hoyer pad in place and call light in reach.   Felecia Shelling  PTA [facility]  Acute  Rehab Pager      [phone]

## 2012-12-23 NOTE — Progress Notes (Signed)
PARENTERAL NUTRITION CONSULT NOTE - Follow-up  Pharmacy Consult for TNA Indication: SBO vs ileus  No Known Allergies  Patient Measurements: Height: 6' (182.9 cm) Weight: 177 lb 4 oz (80.4 kg) IBW/kg (Calculated) : 77.6 Adjusted Body Weight: 81 kg Weight PTA: 92 kg  Vital Signs: Temp: 98.4 F (36.9 C) (12/08 0459) Temp src: Oral (12/08 0459) BP: 139/79 mmHg (12/08 0459) Pulse Rate: 88 (12/08 0459) Intake/Output from previous day: 12/07 0701 - 12/08 0700 In: 2588.6 [IV Piggyback:400; ZOX:0960.4] Out: 1860 [Urine:1860]  Labs:  Recent Labs  12/21/12 0600 12/22/12 0332 12/23/12 0414  WBC 15.0* 16.3* 15.4*  HGB 12.3* 12.3* 11.9*  HCT 36.0* 36.7* 35.5*  PLT 684* 756* 681*     Recent Labs  12/21/12 0600 12/22/12 0332 12/23/12 0414  NA 134* 134* 136  K 4.7 4.6 4.5  CL 102 102 103  CO2 23 23 23   GLUCOSE 141* 135* 122*  BUN 29* 31* 32*  CREATININE 0.94 0.96 0.88  CALCIUM 8.3* 8.4 8.2*  MG 2.3 2.2 2.2  PHOS  --   --  4.1  PROT 7.2 7.2 6.9  ALBUMIN 2.3* 2.2* 2.1*  AST 22 20 17   ALT 12 11 9   ALKPHOS 94 97 90  BILITOT 0.4 0.5 0.4  TRIG  --   --  41   Estimated Creatinine Clearance: 90.6 ml/min (by C-G formula based on Cr of 0.88).    Recent Labs  12/22/12 1525 12/22/12 2256 12/23/12 0605  GLUCAP 120* 120* 125*   Insulin Requirements in the past 24 hours:   No hx of DM  1 unit Novolog required - on sensitive SSI q8h  Nutritional Goals/ 24 hr:   RD recommendations (updated 12/3):  2100-2300 KCal/day, 90-105 grams protein/day, Fluid 2.1-2.3 L/day   Goal rate Clinimix E 5/20 of 97ml/hr + 20% lipids at 65ml/hr to deliver 100g protein and 2233 KCal per 24 hr.  Current Nutrition:   Clinimix  E 5/20 66ml/hr + 20% lipids 104ml/hr.  NPO  IVF: None (saline lock)  Assessment:  [name] with hx mental retardation, deafness, HTN, admitted from group home 11/20 after 12 hours of vomiting, diarrhea, hypotension, and c/o RLQ pain.  Stool was +Cdiff.   Pneumoperitoneum on CT, probable ruptured viscus, ileus vs partial SBO, colitis.  Tube feed not begun d/t suspected GI perf, began TNA 11/27.  CT showed improvement but new small abscess on 11/30.  Patient self-removed NGT on 12/5.  12/8: Pt tolerating TPN without problems. Repeat CT abd/pelvisb 12/7 with decrease in pneumoperitoneum, LU pelvic mesenteric abscess has resolved, no other abscess evident. Labs are stable. Swallowing study attempted 12/6 but patient reportedly non-participatory and attempts to provide ice chips/water were not successful. Pt needs NGT vs Panda tube for administration of po vancomycin.   Labs  Glucose: at goal < 150  Electrolytes: wnl Renal: SCr stable/wnl, UOP 0.9 ml/kg/hr LFTs: below ULN  TGs: wnl, 95 (12/1) Prealbumin: improving, 7.8 (11/28), 17.5 (12/1), pending lab 12/8  TPN Access: Triple lumen PICC placed 12/3 (TL CVC removed 12/3) TPN day#: 12  Plan:    Continue Clinimix E 5/20 at goal rate of 83 ml/hr.  TNA to contain standard multivitamins and trace elements.  Cont lipids 20% at 10 ml/hr.  Continue SSI sensitive scale q8h  TNA lab panels on Mondays & Thursdays.  Geoffry Paradise, PharmD, BCPS Pager: [phone] 8:05 AM Pharmacy #: [id number]

## 2012-12-23 NOTE — Progress Notes (Signed)
Attempted to insert NGT x2. Both times pt coughed up tubing from throat, and had visible curled tubing in mouth. MD notified. Will not attempt insertion again at this time. Will continue to monitor pt.

## 2012-12-23 NOTE — Consult Note (Signed)
[facility]    Date of Admission:  12/05/2012   Total days of antibiotics 19        Day 19 metronidazole         Day 5 ceftriaxone        Day 1 oral vancomycin       Reason for Consult: Recurrent fever    Referring Physician: Dr. Carolyne Littles   Principal Problem:   Fever Active Problems:   Clostridium difficile colitis   Pneumoperitoneum of unknown etiology   Intra-abdominal abscess   HCAP (healthcare-associated pneumonia)   Right arm pain   Organic brain syndrome (chronic)   Deaf   ARF (acute renal failure)   HTN (hypertension)   Neuropathy   Acute respiratory failure with hypoxia   Secondary thrombocytosis   Mute   . antiseptic oral rinse  1 application Mouth Rinse QID  . cefTRIAXone (ROCEPHIN)  IV  1 g Intravenous Q24H  . chlorhexidine  15 mL Mouth/Throat BID  . famotidine (PEPCID) IV  20 mg Intravenous Q24H  . insulin aspart  0-9 Units Subcutaneous Q8H  . metronidazole  500 mg Intravenous Q8H  . vancomycin  500 mg Per Tube Q6H    Recommendations: 1. Continue IV metronidazole 2. Discontinue IV ceftriaxone and oral vancomycin 3. Await final results of the C. difficile toxin PCR and blood cultures  Assessment: The exact cause of his fever yesterday is unclear. CT scan shows improvement in the small intra-abdominal abscess and his colitis. His clinical exam does not suggest worsening pneumonia. I recommend stopping ceftriaxone given that the intra-abdominal infection appears to have improved and it will make relapse of C. difficile colitis more likely. His not taking his oral vancomycin. I will continue IV metronidazole alone for terminal treatment of C. difficile colitis. Of course he could have PICC-related bloodstream infection, UTI, or fever due to the process in his right wrist. I will followup with you tomorrow.   HPI: Victor Little is a 67 y.o. male who is deaf and mute history of mental retardation who was admitted  from his group home 11/20 with diarrhea, septic shock and acute renal insufficiency. He was found to have C. difficile colitis with a pneumoperitoneum of unknown etiology. He had transient ventilator dependent respiratory failure. He also had radiographic evidence of pneumonia. He was treated for his colitis in addition to healthcare associated pneumonia and intra-abdominal infection. He slowly improved and his antibiotics were narrowed to IV ceftriaxone and metronidazole 5 days ago. He was improving but had one fever spike last night associated with an episode of diarrhea. He has had no further diarrhea today. He has not had any no respiratory distress or coughing. He has had some right arm pain recently.   Review of Systems: Review of systems not obtained due to patient factors.  Past Medical History  Diagnosis Date  . Mental retardation   . Organic brain syndrome   . Hypertension   . Deaf     History  Substance Use Topics  . Smoking status: Never Smoker   . Smokeless tobacco: Never Used  . Alcohol Use: No    History reviewed. No pertinent family history. No Known Allergies  OBJECTIVE: Blood pressure 119/69, pulse 100, temperature 98.6 F (37 C), temperature source Axillary, resp. rate 16, height 6' (1.829 m), weight 80.4 kg (177 lb 4 oz), SpO2 100.00%. General: He is sitting up in a chair. He appears to  be alert but he does not track with his eyes and there is no obvious way to communicate directly with him. Skin: No rash. Right arm PICC site appears normal Lungs: Clear anteriorly but with shallow respirations Cor: Regular S1-S2 with no murmurs heard Abdomen: Soft with one episode of grimacing with palpation of left lower quadrant that is not reproducible. He has hyperactive bowel sounds. Foley catheter is in place Joints and extremities: He has hand mitts bilaterally for restraints. He has no obvious redness or swelling of his right wrist and arm but grimaces with palpation and  movement of his right hand, wrist and arm  Lab Results Lab Results  Component Value Date   WBC 15.4* 12/23/2012   HGB 11.9* 12/23/2012   HCT 35.5* 12/23/2012   MCV 86.0 12/23/2012   PLT 681* 12/23/2012    Lab Results  Component Value Date   CREATININE 0.88 12/23/2012   BUN 32* 12/23/2012   NA 136 12/23/2012   K 4.5 12/23/2012   CL 103 12/23/2012   CO2 23 12/23/2012    Lab Results  Component Value Date   ALT 9 12/23/2012   AST 17 12/23/2012   ALKPHOS 90 12/23/2012   BILITOT 0.4 12/23/2012     Microbiology: Recent Results (from the past 240 hour(s))  CULTURE, BLOOD (ROUTINE X 2)     Status: None   Collection Time    12/22/12  3:40 PM      Result Value Range Status   Specimen Description BLOOD LEFT HAND   Final   Special Requests BOTTLES DRAWN AEROBIC ONLY 1CC   Final   Culture  Setup Time     Final   Value: 12/22/2012 20:02     Performed at [facility]   Culture     Final   Value:        BLOOD CULTURE RECEIVED NO GROWTH TO DATE CULTURE WILL BE HELD FOR 5 DAYS BEFORE ISSUING A FINAL NEGATIVE REPORT     Performed at [facility]   Report Status PENDING   Incomplete  CULTURE, BLOOD (ROUTINE X 2)     Status: None   Collection Time    12/22/12  5:15 PM      Result Value Range Status   Specimen Description BLOOD RIGHT FOOT   Final   Special Requests BOTTLES DRAWN AEROBIC AND ANAEROBIC [facility]   Final   Culture  Setup Time     Final   Value: 12/22/2012 20:02     Performed at [facility]   Culture     Final   Value:        BLOOD CULTURE RECEIVED NO GROWTH TO DATE CULTURE WILL BE HELD FOR 5 DAYS BEFORE ISSUING A FINAL NEGATIVE REPORT     Performed at [facility]   Report Status PENDING   Incomplete    Cliffton Asters, MD [facility] [phone] pager   [phone] cell 12/23/2012, 5:10 PM

## 2012-12-23 NOTE — Evaluation (Signed)
Clinical/Bedside Swallow Evaluation Patient Details  Name: Victor Little MRN: [id number] Date of Birth: Jul 26, 1945  Today's Date: 12/23/2012 Time: 1520-1606 SLP Time Calculation (min): 46 min  Past Medical History:  Past Medical History  Diagnosis Date  . Mental retardation   . Organic brain syndrome   . Hypertension   . Deaf    Past Surgical History:  Past Surgical History  Procedure Laterality Date  . Orif right patella  09/23/2005  . Removal of right patella hardware  01/26/2006   HPI:  67 y.o. male with organic brain syndrome, MR, deafness, admitted from group home 11/20 with C diff colitis, possible SBO vs ileus and sepsis, vomiting and aspiration on 11/22, leading to VDRF 11/22- 12/1.  NG for small bowel obstruction;  TPN for nutrition.     Assessment / Plan / Recommendation Clinical Impression  Pt. appears to have little to no interest in food or liquids at this time.  Initially he refused all boluses presented, turning head away to avoid the spoon or straw.  After much encouragement and visual cues provided, SLP approached pt. from his right side (initally was on his left) and pt. actually begain accepting small bites of ice cream, then applesauce, then sip of H20 from the cup (would not use straw).  Pt. took approximately 3 bites of each puree, refused the crackers (saltine and graham), and took 2 swallows of water from the cup.  Pt. would not attempt to hold the cup or spoon, and refused further bites or sips.  Swallow function appears to be intact for purees and thin liquids, however, pt. did begin to belch and hiccough after the small bites/sips given, and grimmacing.  ? if pain with po intake, due to esophagitis and /or esophageal dysmotility.  Consider GI w/u.      Aspiration Risk  Mild    Diet Recommendation Thin liquid   Liquid Administration via: Cup Medication Administration: Via alternative means Supervision: Staff to assist with self feeding;Full  supervision/cueing for compensatory strategies;Trained caregiver to feed patient Compensations: Slow rate;Small sips/bites Postural Changes and/or Swallow Maneuvers: Seated upright 90 degrees;Upright 30-60 min after meal    Other  Recommendations Recommended Consults: Consider GI evaluation;Consider esophageal assessment Oral Care Recommendations: Oral care Q4 per protocol;Staff/trained caregiver to provide oral care Other Recommendations: Clarify dietary restrictions   Follow Up Recommendations  24 hour supervision/assistance    Frequency and Duration min 2x/week  2 weeks   Pertinent Vitals/Pain Pt. Grimaces when swallowing.    SLP Swallow Goals  See Care Plan     Swallow Study Prior Functional Status       General HPI: 67 y.o. male with organic brain syndrome, MR, deafness, admitted from group home 11/20 with C diff colitis, possible SBO vs ileus and sepsis, vomiting and aspiration on 11/22, leading to VDRF 11/22- 12/1.  NG for small bowel obstruction;  TPN for nutrition.   Type of Study: Bedside swallow evaluation Previous Swallow Assessment: BSE 12/21/12 attempted.  Pt refused all boluses presented. Diet Prior to this Study: NPO;IV;TNA Temperature Spikes Noted: Yes Respiratory Status: Room air History of Recent Intubation: No Behavior/Cognition: Alert;Uncooperative;Requires cueing;Distractible;Decreased sustained attention;Hard of hearing;Doesn't follow directions Oral Cavity - Dentition: Adequate natural dentition Self-Feeding Abilities: Total assist Patient Positioning: Upright in chair Baseline Vocal Quality:  (non-verbal; occassional moan) Volitional Cough: Cognitively unable to elicit Volitional Swallow: Unable to elicit    Oral/Motor/Sensory Function Overall Oral Motor/Sensory Function: Impaired at baseline Labial ROM: Reduced right;Reduced left Labial Symmetry: Within Functional  Limits   Ice Chips Ice chips: Not tested   Thin Liquid Thin Liquid: Within functional  limits Presentation: Cup (Would not accept from spoon or straw.)    Nectar Thick Nectar Thick Liquid: Not tested   Honey Thick Honey Thick Liquid: Not tested   Puree Puree: Within functional limits Presentation: Spoon   Solid   GO    Solid:  (Pt. refused)       Maryjo Rochester T 12/23/2012,4:06 PM

## 2012-12-23 NOTE — Progress Notes (Signed)
[facility] PROGRESS NOTE  Victor Little RUE:[id number] DOB: Sep 03, 1945 DOA: 12/05/2012 PCP: Pcp Not In System  HPI/Subjective:  67 yo WM PMHx deafness, mental retardation from a group home who presented on 11/20 to the emergency room after multiple episodes of diarrhea. Patient was noted to have a a markedly elevated white blood cell count, acute renal failure and signs consistent with septic shock likely from C. difficile colitis. He was also noted to have a small bowel obstruction an NG tube was placed. Cultures came back soon after positive for C. difficile and she was admitted to the ICU started on IV Flagyl and by mouth vancomycin enemas.  By 11/22, patient had a vomiting episode which led to aspiration and secondary respiratory failure and intubation. At this point, critical care became the primary attending. Patient was continued on antibiotics plus respirator. And then on 11/25, and abdominal x-ray following a small bowel obstruction also noted air-fluid levels. General surgery was consulted and a CT of the abdomen and pelvis noted pneumoperitoneum. Patient was watched closely and vital signs and lab work all looks to be improving with a benign abdominal exam and was felt likely patient had a perforation that had sealed in the past. On 11/26, patient had low-grade fevers, so antifungal agent, miacalcin, was added for antifungal prevention. Followup CT of abdomen and pelvis done 11/30 noted increased bilateral pleural effusions with present right middle and lower lobe pneumonia and decreased pneumoperitoneum. Patient is also noted to have 3.7 cm abscess in the left lower quadrant.  On 12/1, patient was able to be extubated and weaned down to minimal oxygen. His acute renal failure which is felt to be secondary to septic shock and hypotension had since resolved as well. His white blood cell count has continued to improve. He was transferred to the hospitalist service after 12/2 Because of  prolonged ventilator support and current n.p.o. status given bowel issues, TPN has been recommended. On 12/3, hospitalist attending has ordered a PICC line and pharmacy for TPN. Surgery this point plans to monitor patient in regards to abscess given his overall improvement. He saw significant NG drainage to NG tube was recommended to be left in for now. Nursing also noted some guarding of the right wrist and pain with movement so x-rays were done which were unremarkable.  Spoke with infectious disease on 12/4. Patient initially on IV Zosyn and vancomycin for aspiration pneumonia and then Zosyn continued for abdominal abscess. Patient has been on enteral vancomycin and IV Flagyl for C. difficile and has completed full course. Infectious disease recommending discontinuation of Zosyn and changing to IV Rocephin until abdominal abscess resolves. They also recommend continuing coverage for C. difficile until IV Rocephin discontinued. They recommend much simpler course using by mouth Flagyl. 12/20/2012, Patient is deaf, unable to communicate needs, unresponsive to any sort of commands. Patient does respond to painful stimuli and appears to be distressed when head of bed moved. 12/21/2012 patient still unable to communicate, deaf, unresponsive to commands. Patient spontaneously opens eyes and moves head and upper extremities. Patient responds to painful stimuli. 12/22/2012 patient still unable to communicate, deaf, unresponsive to commands. Patient does not respond to gentle stimulation however grimaces and raises off the bed with palpation to his left lower/right lower quadrant of the abdomen. TODAY patient sitting in chair looks around, continue grimace with palpation of left lower/right lower quadrant of his abdomen. Patient also continues to have right swollen hand/wrist pain with movement (x-ray of right wrist 12/3 showed  mild degenerative changes first carpometacarpal joint). Attempted to place NG tube on 2 separate  occasions and patient coughed the tube out twice.      Assessment/Plan:  Septic shock:  -Patient WBC. Slightly down from yesterday however still maintained leukocytosis at 15.4. Spiked a fever of 38.7 c overnight.  -Panculture - CXR 12/7; no change from previous CXR, no explanation for patient's increasing WBC and fever  -Abdominal/pelvis CT 12/7; clear evidence of a process to explain patient's increasing WBC and fever. Reviewed CT with Dr. Manson Passey (radiologist) did not see evidence of toxic megacolon or other infective process -Continue current antibiotic regimen until infectious disease evaluates patient and makes recommendations  Diarrhea: - Secondary C. difficile, resolved  Organic brain syndrome (chronic) -Patient is deaf, unable to communicate needs, unresponsive to any sort commands. -Spoke with sister Corinne Ports [phone]([email or web address]) and she stated there is no clear way to communicate with patient  ARF (acute renal failure): - Felt to be secondary to hypotension from septic shock. Resolved  Secondary hypotension:  -Resolved, secondary to septic shock  SBO (small bowel obstruction):  -Recent self DC'd  NG tube on 12/8  HTN (hypertension):  -Stable. -Continue IV medication; if/when patient can pass swallow tests could convert medication to PO  Clostridium difficile colitis:  -Stable. - See above for antibiotic coverage; NOTE unsure how well patient can swallow medication changed metronidazole back to IV -If/when patient can pass swallow tests will convert medications to PO  Aspiration pneumonia:  -CXR 12/7 ; see below for results   -BNP was normal.  -See above for antibiotic coverage  Acute respiratory failure with hypoxia:  -Resolved, secondary to aspiration pneumonia  Left lower quadrant abscess:  -Continue to observe as per surgery, see above for antibiotic coverage -patient's WBC have continued to increase, and patient continues to have increased tachycardia,  and fever obtained new CT abdomen/pelvis with contrast, pancultured patient, and contacted infectious disease  Pneumoperitoneum: Looks to be self improved  Thrombocytosis -Reactive vs infection   Contact information;  sister Corinne Ports [phone]([email or web address])    Code Status: Full code Family Communication: Medical staff attempted to contact group home yesterday; call not returned Disposition Plan: Patient not medically stable   Consultants:  Critical Care  General surgery     Procedures: Blood culture 12/7; NGTD  CT abdomen/pelvis with contrast 12/22/2012 Interval decrease in pneumoperitoneum although it does persist.  The small anterior left upper pelvic mesenteric abscess seen on the  previous study has resolved with no visible fluid collection at this  location today. No other abscess evident.  Persistent irregular wall thickening in the distal sigmoid colon and  rectum with perirectal edema/inflammation. Scattered tiny perirectal  lymph nodes are stable and the dominant 1.4 x 2.2 cm left pararectal  nodule is not substantially changed. Imaging features could be  related to the infection, inflammation or neoplasm.  CXR 12/22/2012 Streaky bibasilar atelectasis. No definite infiltrates or effusions.  Pneumoperitoneum again demonstrated.  Intubation the patient 11/22-12/1  Placement of left IJ central line 11/22  Right wrist x-ray 12/18/2012 IMPRESSION:  No acute bony abnormality of the right wrist is demonstrated on this  limited two view series    Antibiotics: IV Flagyl 11/20 >> 12/4 Zosyn 11/20 >> 12/4 Enteral Vancomycin 11/20 >> 12/30 IV Vancomycin 11/24 >> 12/01  Micafungin 11/26 >> 12/4  Flagyl po 12/4 >> stopped 12/5 Metronidazole IV 12/5>>> Rocephin IV 12/4 >>       Objective: Filed Vitals:   12/23/12 1442  BP:  119/69  Pulse: 100  Temp: 98.6 F (37 C)  Resp: 16    Intake/Output Summary (Last 24 hours) at 12/23/12 1506 Last data filed  at 12/23/12 1443  Gross per 24 hour  Intake   1857 ml  Output   2460 ml  Net   -603 ml   Filed Weights   12/21/12 0500 12/22/12 0500 12/23/12 0500  Weight: 75.6 kg (166 lb 10.7 oz) 74.9 kg (165 lb 2 oz) 80.4 kg (177 lb 4 oz)    Exam:   General:  acute distress when RLQ/LLQ of his abdomen is palpated, patient has chronic mental retardation, does not respond to commands. Does respond to painful stimuli  Cardiovascular: Regular rate and rhythm, negative murmurs rubs or gallops, DP/PT pulse +1 bilateral   Respiratory: Clear to auscultation bilaterally, limited inspiration  Abdomen: Soft, tender left lower quadrant/right lower quadrant (patient grimaces when palpated), positive hyperactive bowel sounds, continued mild distention.   Musculoskeletal: No clubbing or cyanosis or edema, right arm double-lumen PICC line for clean negative sign of infection  Data Reviewed: Basic Metabolic Panel:  Recent Labs Lab 12/19/12 0416 12/20/12 0444 12/21/12 0600 12/22/12 0332 12/23/12 0414  NA 132* 133* 134* 134* 136  K 4.3 4.5 4.7 4.6 4.5  CL 100 100 102 102 103  CO2 23 23 23 23 23   GLUCOSE 129* 144* 141* 135* 122*  BUN 26* 28* 29* 31* 32*  CREATININE 1.00 0.96 0.94 0.96 0.88  CALCIUM 7.9* 8.1* 8.3* 8.4 8.2*  MG 2.4  --  2.3 2.2 2.2  PHOS 3.4  --   --   --  4.1   Liver Function Tests:  Recent Labs Lab 12/19/12 0416 12/21/12 0600 12/22/12 0332 12/23/12 0414  AST 19 22 20 17   ALT 12 12 11 9   ALKPHOS 84 94 97 90  BILITOT 0.5 0.4 0.5 0.4  PROT 6.9 7.2 7.2 6.9  ALBUMIN 2.4* 2.3* 2.2* 2.1*   No results found for this basename: LIPASE, AMYLASE,  in the last 168 hours No results found for this basename: AMMONIA,  in the last 168 hours CBC:  Recent Labs Lab 12/18/12 0800 12/19/12 0416 12/21/12 0600 12/22/12 0332 12/23/12 0414  WBC 12.7* 14.0* 15.0* 16.3* 15.4*  NEUTROABS  --   --  11.0* 12.2* 11.2*  HGB 12.4* 12.6* 12.3* 12.3* 11.9*  HCT 36.4* 36.7* 36.0* 36.7* 35.5*   MCV 84.3 83.8 84.9 85.3 86.0  PLT 529* 601* 684* 756* 681*   Cardiac Enzymes: No results found for this basename: CKTOTAL, CKMB, CKMBINDEX, TROPONINI,  in the last 168 hours BNP (last 3 results)  Recent Labs  12/07/12 0335 12/18/12 0300  PROBNP 680.8* 53.8   CBG:  Recent Labs Lab 12/22/12 0738 12/22/12 1525 12/22/12 2256 12/23/12 0605 12/23/12 1438  GLUCAP 121* 120* 120* 125* 121*    Recent Results (from the past 240 hour(s))  CULTURE, BLOOD (ROUTINE X 2)     Status: None   Collection Time    12/22/12  3:40 PM      Result Value Range Status   Specimen Description BLOOD LEFT HAND   Final   Special Requests BOTTLES DRAWN AEROBIC ONLY 1CC   Final   Culture  Setup Time     Final   Value: 12/22/2012 20:02     Performed at [facility]   Culture     Final   Value:        BLOOD CULTURE RECEIVED NO GROWTH TO DATE CULTURE WILL  BE HELD FOR 5 DAYS BEFORE ISSUING A FINAL NEGATIVE REPORT     Performed at [facility]   Report Status PENDING   Incomplete  CULTURE, BLOOD (ROUTINE X 2)     Status: None   Collection Time    12/22/12  5:15 PM      Result Value Range Status   Specimen Description BLOOD RIGHT FOOT   Final   Special Requests BOTTLES DRAWN AEROBIC AND ANAEROBIC [facility]   Final   Culture  Setup Time     Final   Value: 12/22/2012 20:02     Performed at [facility]   Culture     Final   Value:        BLOOD CULTURE RECEIVED NO GROWTH TO DATE CULTURE WILL BE HELD FOR 5 DAYS BEFORE ISSUING A FINAL NEGATIVE REPORT     Performed at [facility]   Report Status PENDING   Incomplete     Studies: Dg Chest 2 View  12/22/2012   CLINICAL DATA:  Shortness of breath.  EXAM: CHEST  2 VIEW  COMPARISON:  12/17/2012.  FINDINGS: The cardiac silhouette, mediastinal and hilar contours are within normal limits and stable. The right PICC line tip is in the distal SVC. No infiltrates, edema or effusions. Streaky bibasilar atelectasis. Pneumoperitoneum  again demonstrated.  IMPRESSION: Streaky bibasilar atelectasis. No definite infiltrates or effusions.  Pneumoperitoneum again demonstrated.   Electronically Signed   By: Loralie Champagne M.D.   On: 12/22/2012 16:46   Ct Abdomen Pelvis W Contrast  12/22/2012   CLINICAL DATA:  Left lower quadrant abscess.  EXAM: CT ABDOMEN AND PELVIS WITH CONTRAST  TECHNIQUE: Multidetector CT imaging of the abdomen and pelvis was performed using the standard protocol following bolus administration of intravenous contrast.  CONTRAST:  OMNIPAQUE IOHEXOL 300 MG/ML  SOLN  COMPARISON:  12/15/2012  FINDINGS: Images which include the lung bases show the central tree in bud opacity involving the right middle lobe and both lower lobes although this appears slightly improved in the interval. These small bilateral pleural effusions seen previously have resolved and the dependent lower lobe collapse seen bilaterally on the previous study has also resolved.  Prominent intraperitoneal free air is seen in the upper abdomen. 6 mm low-density lesion in the dome of the liver seen on the previous study is not readily evident on today's exam. 1.9 x 1.4 cm lesion in the lateral segment of the left liver seen previously now measures 1.5 x 1.7 cm, likely secondary to differential slice acquisition.  The spleen, stomach, duodenum, pancreas, gallbladder, and adrenal glands are unremarkable. No hydronephrosis or focal mass lesion in either kidney.  No abdominal aortic aneurysm. No abdominal lymphadenopathy. No intraperitoneal free fluid.  Imaging through the pelvis shows no intraperitoneal free fluid. Small inguinal lymph nodes are seen bilaterally and there are small left pelvic sidewall lymph nodes associated. Gas in the urinary bladder is presumably secondary to recent instrumentation although infection could have this appearance.  Wall thickening in the rectum persists. The aid 1.4 x 2.2 cm lesion adjacent to the left aspect of the proximal rectum  is not substantially changed in the interval. Scattered small perirectal lymph nodes are again noted.  Contrast material is identified in the colon. The terminal ileum is unremarkable. The appendix is opacified and unremarkable in appearance by CT. There scattered tiny metallic foreign bodies in the region of the ileocecal valve and cecal tip, of indeterminate etiology.  The small anterior  left pelvic abscess seen on the previous study has resolved. There is no residual fluid collection at this location today.  Bone windows reveal no worrisome lytic or sclerotic osseous lesions.  IMPRESSION: Interval decrease in pneumoperitoneum although it does persist.  The small anterior left upper pelvic mesenteric abscess seen on the previous study has resolved with no visible fluid collection at this location today. No other abscess evident.  Persistent irregular wall thickening in the distal sigmoid colon and rectum with perirectal edema/inflammation. Scattered tiny perirectal lymph nodes are stable and the dominant 1.4 x 2.2 cm left pararectal nodule is not substantially changed. Imaging features could be related to the infection, inflammation or neoplasm.   Electronically Signed   By: Kennith Center M.D.   On: 12/22/2012 16:52    Scheduled Meds: . antiseptic oral rinse  1 application Mouth Rinse QID  . cefTRIAXone (ROCEPHIN)  IV  1 g Intravenous Q24H  . chlorhexidine  15 mL Mouth/Throat BID  . famotidine (PEPCID) IV  20 mg Intravenous Q24H  . insulin aspart  0-9 Units Subcutaneous Q8H  . metronidazole  500 mg Intravenous Q8H  . vancomycin  500 mg Per Tube Q6H   Continuous Infusions: . Marland KitchenTPN (CLINIMIX-E) Adult     And  . fat emulsion    . Marland KitchenTPN (CLINIMIX-E) Adult 83 mL/hr at 12/22/12 1710   And  . fat emulsion 250 mL (12/22/12 1710)    Principal Problem:   Septic shock Active Problems:   Diarrhea   Organic brain syndrome (chronic)   Deaf   ARF (acute renal failure)   Secondary hypotension   SBO  (small bowel obstruction)   HTN (hypertension)   Neuropathy   Clostridium difficile colitis   Wheezes   Acute respiratory failure with hypoxia   Pneumoperitoneum of unknown etiology   Intra-abdominal abscess   Secondary thrombocytosis    Time spent: 40 minutes    WOODS, CURTIS, J  [facility] Pager [phone]. If 7PM-7AM, please contact night-coverage at [email or web address], password [facility] 12/23/2012, 3:06 PM  LOS: 18 days

## 2012-12-24 DIAGNOSIS — R4701 Aphasia: Secondary | ICD-10-CM

## 2012-12-24 LAB — CBC WITH DIFFERENTIAL/PLATELET
Basophils Absolute: 0 10*3/uL (ref 0.0–0.1)
Eosinophils Absolute: 0.2 10*3/uL (ref 0.0–0.7)
Eosinophils Relative: 1 % (ref 0–5)
HCT: 34.6 % — ABNORMAL LOW (ref 39.0–52.0)
Lymphocytes Relative: 8 % — ABNORMAL LOW (ref 12–46)
MCHC: 33.5 g/dL (ref 30.0–36.0)
MCV: 85.4 fL (ref 78.0–100.0)
Monocytes Absolute: 1.6 10*3/uL — ABNORMAL HIGH (ref 0.1–1.0)
Monocytes Relative: 11 % (ref 3–12)
Neutro Abs: 11.4 10*3/uL — ABNORMAL HIGH (ref 1.7–7.7)
RDW: 14.8 % (ref 11.5–15.5)
WBC: 14.4 10*3/uL — ABNORMAL HIGH (ref 4.0–10.5)

## 2012-12-24 LAB — COMPREHENSIVE METABOLIC PANEL
ALT: 9 U/L (ref 0–53)
AST: 15 U/L (ref 0–37)
Albumin: 2.1 g/dL — ABNORMAL LOW (ref 3.5–5.2)
Alkaline Phosphatase: 94 U/L (ref 39–117)
Potassium: 4.2 mEq/L (ref 3.5–5.1)
Sodium: 137 mEq/L (ref 135–145)
Total Bilirubin: 0.4 mg/dL (ref 0.3–1.2)
Total Protein: 6.8 g/dL (ref 6.0–8.3)

## 2012-12-24 LAB — URINE CULTURE
Colony Count: NO GROWTH
Culture: NO GROWTH

## 2012-12-24 LAB — MAGNESIUM: Magnesium: 2.2 mg/dL (ref 1.5–2.5)

## 2012-12-24 LAB — GLUCOSE, CAPILLARY: Glucose-Capillary: 129 mg/dL — ABNORMAL HIGH (ref 70–99)

## 2012-12-24 MED ORDER — TRACE MINERALS CR-CU-F-FE-I-MN-MO-SE-ZN IV SOLN
INTRAVENOUS | Status: AC
  Administered 2012-12-24: 18:00:00 via INTRAVENOUS
  Filled 2012-12-24: qty 2000

## 2012-12-24 MED ORDER — FAT EMULSION 20 % IV EMUL
240.0000 mL | INTRAVENOUS | Status: AC
  Administered 2012-12-24: 240 mL via INTRAVENOUS
  Filled 2012-12-24: qty 250

## 2012-12-24 NOTE — Progress Notes (Signed)
[facility] PROGRESS NOTE  Victor Little NWG:[id number] DOB: November 13, 1945 DOA: 12/05/2012 PCP: Pcp Not In System  HPI/Subjective:  67 yo WM PMHx deafness, mental retardation from a group home who presented on 11/20 to the emergency room after multiple episodes of diarrhea. Patient was noted to have a a markedly elevated white blood cell count, acute renal failure and signs consistent with septic shock likely from C. difficile colitis. He was also noted to have a small bowel obstruction an NG tube was placed. Cultures came back soon after positive for C. difficile and she was admitted to the ICU started on IV Flagyl and by mouth vancomycin enemas.  By 11/22, patient had a vomiting episode which led to aspiration and secondary respiratory failure and intubation. At this point, critical care became the primary attending. Patient was continued on antibiotics plus respirator. And then on 11/25, and abdominal x-ray following a small bowel obstruction also noted air-fluid levels. General surgery was consulted and a CT of the abdomen and pelvis noted pneumoperitoneum. Patient was watched closely and vital signs and lab work all looks to be improving with a benign abdominal exam and was felt likely patient had a perforation that had sealed in the past. On 11/26, patient had low-grade fevers, so antifungal agent, miacalcin, was added for antifungal prevention. Followup CT of abdomen and pelvis done 11/30 noted increased bilateral pleural effusions with present right middle and lower lobe pneumonia and decreased pneumoperitoneum. Patient is also noted to have 3.7 cm abscess in the left lower quadrant.  On 12/1, patient was able to be extubated and weaned down to minimal oxygen. His acute renal failure which is felt to be secondary to septic shock and hypotension had since resolved as well. His white blood cell count has continued to improve. He was transferred to the hospitalist service after 12/2 Because of  prolonged ventilator support and current n.p.o. status given bowel issues, TPN has been recommended. On 12/3, hospitalist attending has ordered a PICC line and pharmacy for TPN. Surgery this point plans to monitor patient in regards to abscess given his overall improvement. He saw significant NG drainage to NG tube was recommended to be left in for now. Nursing also noted some guarding of the right wrist and pain with movement so x-rays were done which were unremarkable.  Spoke with infectious disease on 12/4. Patient initially on IV Zosyn and vancomycin for aspiration pneumonia and then Zosyn continued for abdominal abscess. Patient has been on enteral vancomycin and IV Flagyl for C. difficile and has completed full course. Infectious disease recommending discontinuation of Zosyn and changing to IV Rocephin until abdominal abscess resolves. They also recommend continuing coverage for C. difficile until IV Rocephin discontinued. They recommend much simpler course using by mouth Flagyl. 12/20/2012, Patient is deaf, unable to communicate needs, unresponsive to any sort of commands. Patient does respond to painful stimuli and appears to be distressed when head of bed moved. 12/21/2012 patient still unable to communicate, deaf, unresponsive to commands. Patient spontaneously opens eyes and moves head and upper extremities. Patient responds to painful stimuli. 12/22/2012 patient still unable to communicate, deaf, unresponsive to commands. Patient does not respond to gentle stimulation however grimaces and raises off the bed with palpation to his left lower/right lower quadrant of the abdomen. 12/23/2012 patient sitting in chair looks around, continue grimace with palpation of left lower/right lower quadrant of his abdomen. Patient also continues to have right swollen hand/wrist pain with movement (x-ray of right wrist 12/3 showed  mild degenerative changes first carpometacarpal joint). Attempted to place NG tube on 2  separate occasions and patient coughed the tube out twice. TODAY patient continues to grimace with operation to his left lower quadrant of the abdomen. Positive dark colored watery diarrhea      Assessment/Plan:  Septic shock:  -Patient WBC. Slightly down from yesterday however still maintained leukocytosis at 15.4. Spiked a fever of 38.7 c overnight.  -Panculture - CXR 12/7; no change from previous CXR, no explanation for patient's increasing WBC and fever  -Abdominal/pelvis CT 12/7; clear evidence of a process to explain patient's increasing WBC and fever. Reviewed CT with Dr. Manson Passey (radiologist) did not see evidence of toxic megacolon or other infective process -Antibiotic regimen per infectious disease.   Diarrhea: - Secondary C. difficile, resolved?  Organic brain syndrome (chronic) -Patient is deaf, unable to communicate needs, unresponsive to any sort commands. -12/8 Spoke with sister Corinne Ports [phone]([email or web address]) and she stated there is no clear way to communicate with patient  ARF (acute renal failure): - Felt to be secondary to hypotension from septic shock. Resolved  Secondary hypotension:  -Resolved, secondary to septic shock  SBO (small bowel obstruction):  -Recent self DC'd  NG tube on 12/8  HTN (hypertension):  -Stable. -Continue IV medication; if/when patient can pass swallow tests could convert medication to PO -Consult placed for modified barium swallow; unsure patient will be able to participate  Clostridium difficile colitis:  -Stable. - See above for antibiotic coverage; NOTE unsure how well patient can swallow medication changed metronidazole back to IV -If/when patient can pass swallow tests will convert medications to PO  Aspiration pneumonia:  -CXR 12/7 ; see below for results   -BNP was normal.  -See infectious disease recommendations for antibiotic coverage  Acute respiratory failure with hypoxia:  -Resolved, secondary to aspiration  pneumonia  Left lower quadrant abscess:  -Continue to observe as per surgery, see above for antibiotic coverage -Over past 24 hours; continue leukocytosis, although slightly decreased from previous day; negative fever, but continued episodes of tachycardia and HTN.  -New CT abdomen/pelvis with contrast, and pancultured has not shown clear cause for the leukocytosis  Pneumoperitoneum: Looks to be self improved  Thrombocytosis -Reactive vs infection   Contact information;  sister Corinne Ports [phone]([email or web address])    Code Status: Full code Family Communication: Medical staff attempted to contact group home yesterday; call not returned Disposition Plan: Patient not medically stable   Consultants:  Critical Care  General surgery     Procedures:  Urine culture 12/22/2012; negative (final)  Blood culture 12/7; NGTD  CT abdomen/pelvis with contrast 12/22/2012 Interval decrease in pneumoperitoneum although it does persist.  The small anterior left upper pelvic mesenteric abscess seen on the  previous study has resolved with no visible fluid collection at this  location today. No other abscess evident.  Persistent irregular wall thickening in the distal sigmoid colon and  rectum with perirectal edema/inflammation. Scattered tiny perirectal  lymph nodes are stable and the dominant 1.4 x 2.2 cm left pararectal  nodule is not substantially changed. Imaging features could be  related to the infection, inflammation or neoplasm.  CXR 12/22/2012 Streaky bibasilar atelectasis. No definite infiltrates or effusions.  Pneumoperitoneum again demonstrated.  Intubation the patient 11/22-12/1  Placement of left IJ central line 11/22  Right wrist x-ray 12/18/2012 IMPRESSION:  No acute bony abnormality of the right wrist is demonstrated on this  limited two view series    Antibiotics: IV Flagyl 11/20 >> 12/4 Zosyn  11/20 >> 12/4 Enteral Vancomycin 11/20 >> 12/3 IV Vancomycin 11/24 >>  12/01  Micafungin 11/26 >> 12/4  Flagyl po 12/4 >> stopped 12/5 Metronidazole IV 12/5>>> Rocephin IV 12/4 >> stopped 12/8      Objective: Filed Vitals:   12/24/12 0442  BP: 156/77  Pulse: 104  Temp: 98.6 F (37 C)  Resp: 18    Intake/Output Summary (Last 24 hours) at 12/24/12 1011 Last data filed at 12/24/12 0504  Gross per 24 hour  Intake 1987.75 ml  Output   1725 ml  Net 262.75 ml   Filed Weights   12/21/12 0500 12/22/12 0500 12/23/12 0500  Weight: 75.6 kg (166 lb 10.7 oz) 74.9 kg (165 lb 2 oz) 80.4 kg (177 lb 4 oz)    Exam:   General:  acute distress when RLQ/LLQ of his abdomen is palpated, patient has chronic mental retardation, does not respond to commands. Does respond to painful stimuli  Cardiovascular: Regular rate and rhythm, negative murmurs rubs or gallops, DP/PT pulse +1 bilateral   Respiratory: Clear to auscultation bilaterally, limited inspiration  Abdomen: Soft, tender left lower quadrant/right lower quadrant (patient grimaces when palpated), positive hyperactive bowel sounds.    Musculoskeletal: No clubbing or cyanosis or edema, right arm double-lumen PICC line for clean negative sign of infection  Data Reviewed: Basic Metabolic Panel:  Recent Labs Lab 12/19/12 0416 12/20/12 0444 12/21/12 0600 12/22/12 0332 12/23/12 0414 12/24/12 0345  NA 132* 133* 134* 134* 136 137  K 4.3 4.5 4.7 4.6 4.5 4.2  CL 100 100 102 102 103 104  CO2 23 23 23 23 23 21   GLUCOSE 129* 144* 141* 135* 122* 141*  BUN 26* 28* 29* 31* 32* 31*  CREATININE 1.00 0.96 0.94 0.96 0.88 0.88  CALCIUM 7.9* 8.1* 8.3* 8.4 8.2* 8.0*  MG 2.4  --  2.3 2.2 2.2 2.2  PHOS 3.4  --   --   --  4.1  --    Liver Function Tests:  Recent Labs Lab 12/19/12 0416 12/21/12 0600 12/22/12 0332 12/23/12 0414 12/24/12 0345  AST 19 22 20 17 15   ALT 12 12 11 9 9   ALKPHOS 84 94 97 90 94  BILITOT 0.5 0.4 0.5 0.4 0.4  PROT 6.9 7.2 7.2 6.9 6.8  ALBUMIN 2.4* 2.3* 2.2* 2.1* 2.1*   No  results found for this basename: LIPASE, AMYLASE,  in the last 168 hours No results found for this basename: AMMONIA,  in the last 168 hours CBC:  Recent Labs Lab 12/19/12 0416 12/21/12 0600 12/22/12 0332 12/23/12 0414 12/24/12 0345  WBC 14.0* 15.0* 16.3* 15.4* 14.4*  NEUTROABS  --  11.0* 12.2* 11.2* 11.4*  HGB 12.6* 12.3* 12.3* 11.9* 11.6*  HCT 36.7* 36.0* 36.7* 35.5* 34.6*  MCV 83.8 84.9 85.3 86.0 85.4  PLT 601* 684* 756* 681* 687*   Cardiac Enzymes: No results found for this basename: CKTOTAL, CKMB, CKMBINDEX, TROPONINI,  in the last 168 hours BNP (last 3 results)  Recent Labs  12/07/12 0335 12/18/12 0300  PROBNP 680.8* 53.8   CBG:  Recent Labs Lab 12/22/12 2256 12/23/12 0605 12/23/12 1438 12/23/12 2154 12/24/12 0457  GLUCAP 120* 125* 121* 127* 132*    Recent Results (from the past 240 hour(s))  CULTURE, BLOOD (ROUTINE X 2)     Status: None   Collection Time    12/22/12  3:40 PM      Result Value Range Status   Specimen Description BLOOD LEFT HAND   Final  Special Requests BOTTLES DRAWN AEROBIC ONLY 1CC   Final   Culture  Setup Time     Final   Value: 12/22/2012 20:02     Performed at [facility]   Culture     Final   Value:        BLOOD CULTURE RECEIVED NO GROWTH TO DATE CULTURE WILL BE HELD FOR 5 DAYS BEFORE ISSUING A FINAL NEGATIVE REPORT     Performed at [facility]   Report Status PENDING   Incomplete  CULTURE, BLOOD (ROUTINE X 2)     Status: None   Collection Time    12/22/12  5:15 PM      Result Value Range Status   Specimen Description BLOOD RIGHT FOOT   Final   Special Requests BOTTLES DRAWN AEROBIC AND ANAEROBIC [facility]   Final   Culture  Setup Time     Final   Value: 12/22/2012 20:02     Performed at [facility]   Culture     Final   Value:        BLOOD CULTURE RECEIVED NO GROWTH TO DATE CULTURE WILL BE HELD FOR 5 DAYS BEFORE ISSUING A FINAL NEGATIVE REPORT     Performed at [facility]   Report  Status PENDING   Incomplete  URINE CULTURE     Status: None   Collection Time    12/22/12  6:37 PM      Result Value Range Status   Specimen Description URINE, CLEAN CATCH   Final   Special Requests Normal   Final   Culture  Setup Time     Final   Value: 12/23/2012 00:58     Performed at [facility] Count     Final   Value: NO GROWTH     Performed at [facility]   Culture     Final   Value: NO GROWTH     Performed at [facility]   Report Status 12/24/2012 FINAL   Final     Studies: Dg Chest 2 View  12/22/2012   CLINICAL DATA:  Shortness of breath.  EXAM: CHEST  2 VIEW  COMPARISON:  12/17/2012.  FINDINGS: The cardiac silhouette, mediastinal and hilar contours are within normal limits and stable. The right PICC line tip is in the distal SVC. No infiltrates, edema or effusions. Streaky bibasilar atelectasis. Pneumoperitoneum again demonstrated.  IMPRESSION: Streaky bibasilar atelectasis. No definite infiltrates or effusions.  Pneumoperitoneum again demonstrated.   Electronically Signed   By: Loralie Champagne M.D.   On: 12/22/2012 16:46   Ct Abdomen Pelvis W Contrast  12/22/2012   CLINICAL DATA:  Left lower quadrant abscess.  EXAM: CT ABDOMEN AND PELVIS WITH CONTRAST  TECHNIQUE: Multidetector CT imaging of the abdomen and pelvis was performed using the standard protocol following bolus administration of intravenous contrast.  CONTRAST:  OMNIPAQUE IOHEXOL 300 MG/ML  SOLN  COMPARISON:  12/15/2012  FINDINGS: Images which include the lung bases show the central tree in bud opacity involving the right middle lobe and both lower lobes although this appears slightly improved in the interval. These small bilateral pleural effusions seen previously have resolved and the dependent lower lobe collapse seen bilaterally on the previous study has also resolved.  Prominent intraperitoneal free air is seen in the upper abdomen. 6 mm low-density lesion in the dome of the  liver seen on the previous study is not readily evident on today's exam.  1.9 x 1.4 cm lesion in the lateral segment of the left liver seen previously now measures 1.5 x 1.7 cm, likely secondary to differential slice acquisition.  The spleen, stomach, duodenum, pancreas, gallbladder, and adrenal glands are unremarkable. No hydronephrosis or focal mass lesion in either kidney.  No abdominal aortic aneurysm. No abdominal lymphadenopathy. No intraperitoneal free fluid.  Imaging through the pelvis shows no intraperitoneal free fluid. Small inguinal lymph nodes are seen bilaterally and there are small left pelvic sidewall lymph nodes associated. Gas in the urinary bladder is presumably secondary to recent instrumentation although infection could have this appearance.  Wall thickening in the rectum persists. The aid 1.4 x 2.2 cm lesion adjacent to the left aspect of the proximal rectum is not substantially changed in the interval. Scattered small perirectal lymph nodes are again noted.  Contrast material is identified in the colon. The terminal ileum is unremarkable. The appendix is opacified and unremarkable in appearance by CT. There scattered tiny metallic foreign bodies in the region of the ileocecal valve and cecal tip, of indeterminate etiology.  The small anterior left pelvic abscess seen on the previous study has resolved. There is no residual fluid collection at this location today.  Bone windows reveal no worrisome lytic or sclerotic osseous lesions.  IMPRESSION: Interval decrease in pneumoperitoneum although it does persist.  The small anterior left upper pelvic mesenteric abscess seen on the previous study has resolved with no visible fluid collection at this location today. No other abscess evident.  Persistent irregular wall thickening in the distal sigmoid colon and rectum with perirectal edema/inflammation. Scattered tiny perirectal lymph nodes are stable and the dominant 1.4 x 2.2 cm left pararectal nodule  is not substantially changed. Imaging features could be related to the infection, inflammation or neoplasm.   Electronically Signed   By: Kennith Center M.D.   On: 12/22/2012 16:52    Scheduled Meds: . antiseptic oral rinse  1 application Mouth Rinse QID  . chlorhexidine  15 mL Mouth/Throat BID  . famotidine (PEPCID) IV  20 mg Intravenous Q24H  . insulin aspart  0-9 Units Subcutaneous Q8H  . metronidazole  500 mg Intravenous Q8H   Continuous Infusions: . sodium chloride 20 mL/hr at 12/23/12 2345  . Marland KitchenTPN (CLINIMIX-E) Adult 83 mL/hr at 12/23/12 1813   And  . fat emulsion 240 mL (12/23/12 1813)    Principal Problem:   Fever Active Problems:   Organic brain syndrome (chronic)   Deaf   ARF (acute renal failure)   HTN (hypertension)   Neuropathy   Clostridium difficile colitis   Acute respiratory failure with hypoxia   Pneumoperitoneum of unknown etiology   Intra-abdominal abscess   Secondary thrombocytosis   HCAP (healthcare-associated pneumonia)   Right arm pain   [place]    Time spent: 35 minutes    WOODS, Roselind Messier  [facility] Pager [phone]. If 7PM-7AM, please contact night-coverage at [email or web address], password [facility] 12/24/2012, 10:11 AM  LOS: 19 days

## 2012-12-24 NOTE — Progress Notes (Signed)
CSW continues to follow for discharge planning - CSW faxed patient's information out to [facility], per sister's request. Awaiting decision as to whether patient will be discharged with TPN/TNA or not to give bed offers to sister. Pasarr obtained. CSW will follow-up.   Clinical Social Work Department CLINICAL SOCIAL WORK PLACEMENT NOTE 12/24/2012  Patient:  Victor Little, Victor Little  Account Number:  [id number] Admit date:  12/05/2012  Clinical Social Worker:  Orpah Greek  Date/time:  12/19/2012 10:54 AM  Clinical Social Work is seeking post-discharge placement for this patient at the following level of care:   SKILLED NURSING   (*CSW will update this form in Epic as items are completed)   12/19/2012  Patient/family provided with [facility] Department of Clinical Social Work's list of facilities offering this level of care within the geographic area requested by the patient (or if unable, by the patient's family).  12/19/2012  Patient/family informed of their freedom to choose among providers that offer the needed level of care, that participate in Medicare, Medicaid or managed care program needed by the patient, have an available bed and are willing to accept the patient.  12/19/2012  Patient/family informed of [facility]' ownership interest in [facility], as well as of the fact that they are under no obligation to receive care at this facility.  PASARR submitted to [employer] on 12/19/2012 PASARR number received from [employer] on 12/24/2012  FL2 transmitted to all facilities in geographic area requested by pt/family on  12/19/2012 FL2 transmitted to all facilities within larger geographic area on   Patient informed that his/her managed care company has contracts with or will negotiate with  certain facilities, including the following:     Patient/family informed of bed offers received:   Patient chooses bed at  Physician recommends and patient chooses bed at    Patient  to be transferred to  on   Patient to be transferred to facility by   The following physician request were entered in Epic:   Additional Comments:   Unice Bailey, LCSW [facility] Clinical Social Worker cell #: [phone]

## 2012-12-24 NOTE — Progress Notes (Signed)
Patient ID: Victor Little, male   DOB: 10-07-1945, 67 y.o.   MRN: [id number]         [facility]    Date of Admission:  12/05/2012           Day 20 metronidazole Principal Problem:   Fever Active Problems:   Clostridium difficile colitis   Pneumoperitoneum of unknown etiology   Intra-abdominal abscess   HCAP (healthcare-associated pneumonia)   Right arm pain   Organic brain syndrome (chronic)   Deaf   ARF (acute renal failure)   HTN (hypertension)   Neuropathy   Acute respiratory failure with hypoxia   Secondary thrombocytosis   [place]   . antiseptic oral rinse  1 application Mouth Rinse QID  . chlorhexidine  15 mL Mouth/Throat BID  . famotidine (PEPCID) IV  20 mg Intravenous Q24H  . insulin aspart  0-9 Units Subcutaneous Q8H  . metronidazole  500 mg Intravenous Q8H     Past Medical History  Diagnosis Date  . Mental retardation   . Organic brain syndrome   . Hypertension   . Deaf     History  Substance Use Topics  . Smoking status: Never Smoker   . Smokeless tobacco: Never Used  . Alcohol Use: No    History reviewed. No pertinent family history.  No Known Allergies  Objective: Temp:  [98.3 F (36.8 C)-98.6 F (37 C)] 98.3 F (36.8 C) (12/09 1300) Pulse Rate:  [103-104] 103 (12/09 1300) Resp:  [18] 18 (12/09 1300) BP: (134-156)/(77-86) 145/86 mmHg (12/09 1300) SpO2:  [100 %] 100 % (12/09 1300)  General: He is in no distress sitting up in his bed Skin: No rash. IV site okay Lungs: Clear anteriorly Cor: Regular S1 and S2 no murmurs Abdomen: Soft and not obviously tender Still has some discomfort with palpation of his right forearm  Lab Results Lab Results  Component Value Date   WBC 14.4* 12/24/2012   HGB 11.6* 12/24/2012   HCT 34.6* 12/24/2012   MCV 85.4 12/24/2012   PLT 687* 12/24/2012    Lab Results  Component Value Date   CREATININE 0.88 12/24/2012   BUN 31* 12/24/2012   NA 137 12/24/2012   K 4.2 12/24/2012   CL  104 12/24/2012   CO2 21 12/24/2012    Lab Results  Component Value Date   ALT 9 12/24/2012   AST 15 12/24/2012   ALKPHOS 94 12/24/2012   BILITOT 0.4 12/24/2012      Microbiology: Recent Results (from the past 240 hour(s))  CULTURE, BLOOD (ROUTINE X 2)     Status: None   Collection Time    12/22/12  3:40 PM      Result Value Range Status   Specimen Description BLOOD LEFT HAND   Final   Special Requests BOTTLES DRAWN AEROBIC ONLY 1CC   Final   Culture  Setup Time     Final   Value: 12/22/2012 20:02     Performed at [facility]   Culture     Final   Value:        BLOOD CULTURE RECEIVED NO GROWTH TO DATE CULTURE WILL BE HELD FOR 5 DAYS BEFORE ISSUING A FINAL NEGATIVE REPORT     Performed at [facility]   Report Status PENDING   Incomplete  CULTURE, BLOOD (ROUTINE X 2)     Status: None   Collection Time    12/22/12  5:15 PM      Result  Value Range Status   Specimen Description BLOOD RIGHT FOOT   Final   Special Requests BOTTLES DRAWN AEROBIC AND ANAEROBIC [facility]   Final   Culture  Setup Time     Final   Value: 12/22/2012 20:02     Performed at [facility]   Culture     Final   Value:        BLOOD CULTURE RECEIVED NO GROWTH TO DATE CULTURE WILL BE HELD FOR 5 DAYS BEFORE ISSUING A FINAL NEGATIVE REPORT     Performed at [facility]   Report Status PENDING   Incomplete  URINE CULTURE     Status: None   Collection Time    12/22/12  6:37 PM      Result Value Range Status   Specimen Description URINE, CLEAN CATCH   Final   Special Requests Normal   Final   Culture  Setup Time     Final   Value: 12/23/2012 00:58     Performed at [facility]     Final   Value: NO GROWTH     Performed at [facility]   Culture     Final   Value: NO GROWTH     Performed at [facility]   Report Status 12/24/2012 FINAL   Final    Studies/Results: Dg Chest 2 View  12/22/2012   CLINICAL DATA:  Shortness of breath.  EXAM:  CHEST  2 VIEW  COMPARISON:  12/17/2012.  FINDINGS: The cardiac silhouette, mediastinal and hilar contours are within normal limits and stable. The right PICC line tip is in the distal SVC. No infiltrates, edema or effusions. Streaky bibasilar atelectasis. Pneumoperitoneum again demonstrated.  IMPRESSION: Streaky bibasilar atelectasis. No definite infiltrates or effusions.  Pneumoperitoneum again demonstrated.   Electronically Signed   By: Loralie Champagne M.D.   On: 12/22/2012 16:46   Ct Abdomen Pelvis W Contrast  12/22/2012   CLINICAL DATA:  Left lower quadrant abscess.  EXAM: CT ABDOMEN AND PELVIS WITH CONTRAST  TECHNIQUE: Multidetector CT imaging of the abdomen and pelvis was performed using the standard protocol following bolus administration of intravenous contrast.  CONTRAST:  OMNIPAQUE IOHEXOL 300 MG/ML  SOLN  COMPARISON:  12/15/2012  FINDINGS: Images which include the lung bases show the central tree in bud opacity involving the right middle lobe and both lower lobes although this appears slightly improved in the interval. These small bilateral pleural effusions seen previously have resolved and the dependent lower lobe collapse seen bilaterally on the previous study has also resolved.  Prominent intraperitoneal free air is seen in the upper abdomen. 6 mm low-density lesion in the dome of the liver seen on the previous study is not readily evident on today's exam. 1.9 x 1.4 cm lesion in the lateral segment of the left liver seen previously now measures 1.5 x 1.7 cm, likely secondary to differential slice acquisition.  The spleen, stomach, duodenum, pancreas, gallbladder, and adrenal glands are unremarkable. No hydronephrosis or focal mass lesion in either kidney.  No abdominal aortic aneurysm. No abdominal lymphadenopathy. No intraperitoneal free fluid.  Imaging through the pelvis shows no intraperitoneal free fluid. Small inguinal lymph nodes are seen bilaterally and there are small left pelvic  sidewall lymph nodes associated. Gas in the urinary bladder is presumably secondary to recent instrumentation although infection could have this appearance.  Wall thickening in the rectum persists. The aid 1.4 x 2.2 cm lesion adjacent to  the left aspect of the proximal rectum is not substantially changed in the interval. Scattered small perirectal lymph nodes are again noted.  Contrast material is identified in the colon. The terminal ileum is unremarkable. The appendix is opacified and unremarkable in appearance by CT. There scattered tiny metallic foreign bodies in the region of the ileocecal valve and cecal tip, of indeterminate etiology.  The small anterior left pelvic abscess seen on the previous study has resolved. There is no residual fluid collection at this location today.  Bone windows reveal no worrisome lytic or sclerotic osseous lesions.  IMPRESSION: Interval decrease in pneumoperitoneum although it does persist.  The small anterior left upper pelvic mesenteric abscess seen on the previous study has resolved with no visible fluid collection at this location today. No other abscess evident.  Persistent irregular wall thickening in the distal sigmoid colon and rectum with perirectal edema/inflammation. Scattered tiny perirectal lymph nodes are stable and the dominant 1.4 x 2.2 cm left pararectal nodule is not substantially changed. Imaging features could be related to the infection, inflammation or neoplasm.   Electronically Signed   By: Kennith Center M.D.   On: 12/22/2012 16:52    Assessment: Has not had any further fever. His nurse reports that he did have liquid bowel movements today. His recent fever may be have been persistent C. difficile colitis. I do not see any evidence of other active infection at this time.  Plan: 1. Continue metronidazole for one week after finishing other systemic antibiotics (6 more days)  Cliffton Asters, MD [facility] [phone] pager   [phone] cell 12/24/2012, 3:38 PM

## 2012-12-24 NOTE — Progress Notes (Signed)
Speech Language Pathology Treatment: Dysphagia  Patient Details Name: Victor Little MRN: [id number] DOB: [birth year] Today's Date: 12/24/2012 Time: 4098-1191 SLP Time Calculation (min): 35 min  Assessment / Plan / Recommendation Clinical Impression  Pt. Initially would not accept po's, but with encouragement and visual cues, pt. Began to accept a few bites of applesauce and sips of juice.  SLP noted change in respirations, with rattling sounds noted in chest.  Pt. Began coughing (very congested).  Ceased feeding.  Pt. Is unable to follow directions for a MBS.  It is highly unlikely that he would participate and swallow barium, as it takes max assist for him to swallow regular food and liquids.  Will modifiy diet and see if pt. Tolerates a Dys. 2 (chopped) diet with nectar thick liquids.  MD may wish to address code status with family/guardian.   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  NG for small bowel obstruction;  TPN for nutrition.     Pertinent Vitals Afebrile; LS diminished  SLP Plan  Continue with current plan of care    Recommendations Diet recommendations: Dysphagia 2 (fine chop);Nectar-thick liquid Liquids provided via: Cup;No straw Medication Administration: Crushed with puree Supervision: Staff to assist with self feeding;Full supervision/cueing for compensatory strategies;Trained caregiver to feed patient Compensations: Slow rate;Small sips/bites Postural Changes and/or Swallow Maneuvers: Seated upright 90 degrees;Upright 30-60 min after meal              Oral Care Recommendations: Oral care Q4 per protocol Follow up Recommendations: 24 hour supervision/assistance;Skilled Nursing facility Plan: Continue with current plan of care    GO     Maryjo Rochester T 12/24/2012, 3:09 PM

## 2012-12-24 NOTE — Progress Notes (Signed)
PARENTERAL NUTRITION CONSULT NOTE - Follow-up  Pharmacy Consult for TNA Indication: SBO vs ileus  No Known Allergies  Patient Measurements: Height: 6' (182.9 cm) Weight: 177 lb 4 oz (80.4 kg) IBW/kg (Calculated) : 77.6 Adjusted Body Weight: 81 kg Weight PTA: 92 kg  Vital Signs: Temp: 98.6 F (37 C) (12/09 0442) Temp src: Axillary (12/09 0442) BP: 156/77 mmHg (12/09 0442) Pulse Rate: 104 (12/09 0442) Intake/Output from previous day: 12/08 0701 - 12/09 0700 In: 1987.8 [P.O.:20; I.V.:60; IV Piggyback:350; TPN:1557.8] Out: 1725 [Urine:1725]  Labs:  Recent Labs  12/22/12 0332 12/23/12 0414 12/24/12 0345  WBC 16.3* 15.4* 14.4*  HGB 12.3* 11.9* 11.6*  HCT 36.7* 35.5* 34.6*  PLT 756* 681* 687*     Recent Labs  12/22/12 0332 12/23/12 0414 12/24/12 0345  NA 134* 136 137  K 4.6 4.5 4.2  CL 102 103 104  CO2 23 23 21   GLUCOSE 135* 122* 141*  BUN 31* 32* 31*  CREATININE 0.96 0.88 0.88  CALCIUM 8.4 8.2* 8.0*  MG 2.2 2.2 2.2  PHOS  --  4.1  --   PROT 7.2 6.9 6.8  ALBUMIN 2.2* 2.1* 2.1*  AST 20 17 15   ALT 11 9 9   ALKPHOS 97 90 94  BILITOT 0.5 0.4 0.4  PREALBUMIN  --  12.7*  --   TRIG  --  41  --    Estimated Creatinine Clearance: 90.6 ml/min (by C-G formula based on Cr of 0.88).    Recent Labs  12/23/12 1438 12/23/12 2154 12/24/12 0457  GLUCAP 121* 127* 132*   Insulin Requirements in the past 24 hours:   No hx of DM  3 units Novolog required - on sensitive SSI q8h  Nutritional Goals/ 24 hr:   RD recommendations (updated 12/3):  2100-2300 KCal/day, 90-105 grams protein/day, Fluid 2.1-2.3 L/day   Goal rate Clinimix E 5/20 of 33ml/hr + 20% lipids at 65ml/hr to deliver 100g protein and 2233 KCal per 24 hr.  Current Nutrition:   Clinimix  E 5/20 34ml/hr + 20% lipids 39ml/hr.  Clear liquid diet starting 12/8  IVF:  NS @ 20 ml/hr  Assessment:  [name] with hx mental retardation, deafness, HTN, admitted from group home 11/20 after 12 hours of  vomiting, diarrhea, hypotension, and c/o RLQ pain.  Stool was +Cdiff.  Pneumoperitoneum on CT, probable ruptured viscus, ileus vs partial SBO, colitis.  Tube feed not begun d/t suspected GI perf, began TNA 11/27.  CT showed improvement but new small abscess on 11/30.  Patient self-removed NGT on 12/5.  12/9: Pt is not tolerating clear liquid diet. Repeat CT abd/pelvisb 12/7 with decrease in pneumoperitoneum, LU pelvic mesenteric abscess has resolved, no other abscess evident. Labs are stable.   Labs  Glucose: at goal < 150  Electrolytes: wnl Renal: SCr stable/wnl, UOP 0.9 ml/kg/hr LFTs: below ULN  TGs: wnl, 95 (12/1) Prealbumin: initially improved but trending down 7.8 (11/28), 17.5 (12/1), 12.7 (12/8)  TPN Access: Triple lumen PICC placed 12/3 (TL CVC removed 12/3) TPN day#: 13  Plan:    Continue Clinimix E 5/20 at goal rate of 83 ml/hr.  TNA to contain standard multivitamins and trace elements.  Cont lipids 20% at 10 ml/hr.  Continue SSI sensitive scale q8h  TNA lab panels on Mondays & Thursdays.  Geoffry Paradise, PharmD, BCPS Pager: [phone] 10:30 AM Pharmacy #: [phone]

## 2012-12-25 LAB — COMPREHENSIVE METABOLIC PANEL
AST: 18 U/L (ref 0–37)
Albumin: 1.9 g/dL — ABNORMAL LOW (ref 3.5–5.2)
BUN: 32 mg/dL — ABNORMAL HIGH (ref 6–23)
Calcium: 8.1 mg/dL — ABNORMAL LOW (ref 8.4–10.5)
Chloride: 105 mEq/L (ref 96–112)
Creatinine, Ser: 0.81 mg/dL (ref 0.50–1.35)
GFR calc non Af Amer: >90 mL/min (ref >90)
Total Protein: 6.6 g/dL (ref 6.0–8.3)

## 2012-12-25 LAB — CBC WITH DIFFERENTIAL/PLATELET
Eosinophils Absolute: 0.3 10*3/uL (ref 0.0–0.7)
Eosinophils Relative: 2 % (ref 0–5)
HCT: 33.8 % — ABNORMAL LOW (ref 39.0–52.0)
Hemoglobin: 11.3 g/dL — ABNORMAL LOW (ref 13.0–17.0)
Lymphocytes Relative: 10 % — ABNORMAL LOW (ref 12–46)
Lymphs Abs: 1.3 10*3/uL (ref 0.7–4.0)
MCH: 28.7 pg (ref 26.0–34.0)
MCV: 85.8 fL (ref 78.0–100.0)
Monocytes Absolute: 1.3 10*3/uL — ABNORMAL HIGH (ref 0.1–1.0)
Monocytes Relative: 10 % (ref 3–12)
RBC: 3.94 MIL/uL — ABNORMAL LOW (ref 4.22–5.81)
WBC: 12.7 10*3/uL — ABNORMAL HIGH (ref 4.0–10.5)

## 2012-12-25 LAB — GLUCOSE, CAPILLARY
Glucose-Capillary: 118 mg/dL — ABNORMAL HIGH (ref 70–99)
Glucose-Capillary: 102 mg/dL — ABNORMAL HIGH (ref 70–99)

## 2012-12-25 MED ORDER — FAT EMULSION 20 % IV EMUL
240.0000 mL | INTRAVENOUS | Status: AC
  Administered 2012-12-25: 240 mL via INTRAVENOUS
  Filled 2012-12-25: qty 250

## 2012-12-25 MED ORDER — TRACE MINERALS CR-CU-F-FE-I-MN-MO-SE-ZN IV SOLN
INTRAVENOUS | Status: AC
  Administered 2012-12-25: 19:00:00 via INTRAVENOUS
  Filled 2012-12-25: qty 2000

## 2012-12-25 MED ORDER — ENSURE PUDDING PO PUDG
1.0000 | ORAL | Status: DC
  Filled 2012-12-25 (×3): qty 1

## 2012-12-25 NOTE — Progress Notes (Signed)
PARENTERAL NUTRITION CONSULT NOTE - Follow-up  Pharmacy Consult for TNA Indication: SBO vs ileus  No Known Allergies  Patient Measurements: Height: 6' (182.9 cm) Weight: 167 lb 8.8 oz (76 kg) IBW/kg (Calculated) : 77.6 Adjusted Body Weight: 81 kg Weight PTA: 92 kg  Vital Signs: Temp: 98 F (36.7 C) (12/10 0446) Temp src: Axillary (12/10 0446) BP: 135/76 mmHg (12/10 0446) Pulse Rate: 103 (12/10 0446) Intake/Output from previous day: 12/09 0701 - 12/10 0700 In: 3951 [I.V.:625; IV Piggyback:350; TPN:2976] Out: 1525 [Urine:1525]  Labs:  Recent Labs  12/23/12 0414 12/24/12 0345 12/25/12 0605  WBC 15.4* 14.4* 12.7*  HGB 11.9* 11.6* 11.3*  HCT 35.5* 34.6* 33.8*  PLT 681* 687* 537*     Recent Labs  12/23/12 0414 12/24/12 0345 12/25/12 0605  NA 136 137 136  K 4.5 4.2 4.3  CL 103 104 105  CO2 23 21 22   GLUCOSE 122* 141* 128*  BUN 32* 31* 32*  CREATININE 0.88 0.88 0.81  CALCIUM 8.2* 8.0* 8.1*  MG 2.2 2.2 2.1  PHOS 4.1  --   --   PROT 6.9 6.8 6.6  ALBUMIN 2.1* 2.1* 1.9*  AST 17 15 18   ALT 9 9 10   ALKPHOS 90 94 97  BILITOT 0.4 0.4 0.4  PREALBUMIN 12.7*  --   --   TRIG 41  --   --    Estimated Creatinine Clearance: 96.4 ml/min (by C-G formula based on Cr of 0.81).    Recent Labs  12/24/12 2147 12/25/12 0611 12/25/12 0739  GLUCAP 134* 123* 118*   Insulin Requirements in the past 24 hours:   No hx of DM  3 units Novolog required - on sensitive SSI q8h  Nutritional Goals/ 24 hr:   RD recommendations (updated 12/3):  2100-2300 KCal/day, 90-105 grams protein/day, Fluid 2.1-2.3 L/day   Goal rate Clinimix E 5/20 of 38ml/hr + 20% lipids at 72ml/hr to deliver 100g protein and 2233 KCal per 24 hr.  Current Nutrition:   Clinimix  E 5/20 70ml/hr + 20% lipids 48ml/hr.  Dysphagia diet starting 12/9  IVF:  NS @ 20 ml/hr  Assessment:  [name] with hx mental retardation, deafness, HTN, admitted from group home 11/20 after 12 hours of vomiting, diarrhea,  hypotension, and c/o RLQ pain.  Stool was +Cdiff.  Pneumoperitoneum on CT, probable ruptured viscus, ileus vs partial SBO, colitis.  Tube feed not begun d/t suspected GI perf, began TNA 11/27.  CT showed improvement but new small abscess on 11/30.  Patient self-removed NGT on 12/5.  12/10: Pt refusing all PO intake (clear liquid diet 12/8 and dysphagia diet starting 12/9). Repeat CT abd/pelvisb 12/7 with decrease in pneumoperitoneum, LU pelvic mesenteric abscess has resolved, no other abscess evident. Labs are stable.   Labs  Glucose: at goal < 150  Electrolytes: wnl Renal: SCr stable/wnl, UOP 0.8 ml/kg/hr LFTs: wnl  TGs: wnl Prealbumin: initially improved but trending down 7.8 (11/28), 17.5 (12/1), 12.7 (12/8)  TPN Access: Triple lumen PICC placed 12/3 (TL CVC removed 12/3) TPN day#: 14  Plan:    Continue Clinimix E 5/20 at goal rate of 83 ml/hr.  TNA to contain standard multivitamins and trace elements.  Continue lipids 20% at 10 ml/hr.  Continue SSI sensitive scale q8h  F/u PO intake  TNA lab panels on Mondays & Thursdays.  Victor Little, PharmD, BCPS Pager: [phone] 10:09 AM Pharmacy #: [phone]

## 2012-12-25 NOTE — Progress Notes (Signed)
NUTRITION FOLLOW UP  Intervention:   - Recommend weaning TPN (PO intake and/or enteral nutrition are more appropriate at this time) - If PO intake does not improve within 1-2 days, recommend trial of appetite stimulant - Recommend adding Florastor to help replenish gut bacteria - Provide Mighty Shakes BID - Provide Ensure Pudding and Magic Cup ice cream once daily each - Will continue to monitor    Nutrition Dx:   Inadequate oral intake related to altered GI function as evidenced by npo status; ongoing, diet has been advanced but PO intake remains inadequate  Goal:   TPN to meet >90% of estimated nutritional needs - being met, discontinue  New Goal: PO intake to meet >/= 90% of their estimated nutrition needs    Monitor:   Weights; 15 lbs below admission weight but stable at 167 lbs for past week TPN; at goal and meeting estimated nutritional needs Labs; elevated BUN, low calcium, low albumin trending down, low hemoglobin trending down  Assessment:   67 years old male resident of a nursing home with PMH relevant for mental retardation, deafness, HTN. Admid cdiff, aspiration, vent.  TPN continues; changed to Clinamix E 5/20 @83  ml/hr with lipids at 10 ml/hr to provide 2233 kcal and 100 grams of protein. Continues to meet 100% of estimated energy and protein needs. Pt's diet has been advanced from clear liquids to Dysphagia 2 with nectar think liquids. Per nursing note pt is not eating most meals, eating 10% of one. Pt asleep at time of visit with lunch tray mainly untouched at bedside. Per SLP note, SLP performed oral care today for pt as pt had thick dried secretions/myucous on upper front teeth. Per SLP, pt appears to be tolerating dysphagia 2 diet relatively well.   CBG (last 3)   Recent Labs  12/24/12 2147 12/25/12 0611 12/25/12 0739  GLUCAP 134* 123* 118*     Height: Ht Readings from Last 1 Encounters:  12/07/12 6' (1.829 m)    Weight Status:   Wt Readings from  Last 1 Encounters:  12/25/12 167 lb 8.8 oz (76 kg)  Admit wt:        182 lb 5 oz (82.6 kg)     Re-estimated needs:  Kcal: 2100-2300 Protein: 90-105 grams Fluid: 2.1-2.3 L/day  Skin: Non-pitting RUE, LUE, RLE, LLE edema; stage 2 sacral ulcer  Diet Order: Dysphagia   Intake/Output Summary (Last 24 hours) at 12/25/12 1407 Last data filed at 12/25/12 0839  Gross per 24 hour  Intake   3951 ml  Output   1025 ml  Net   2926 ml    Last BM: 12/9 diarrhea   Labs:   Recent Labs Lab 12/19/12 0416  12/23/12 0414 12/24/12 0345 12/25/12 0605  NA 132*  < > 136 137 136  K 4.3  < > 4.5 4.2 4.3  CL 100  < > 103 104 105  CO2 23  < > 23 21 22   BUN 26*  < > 32* 31* 32*  CREATININE 1.00  < > 0.88 0.88 0.81  CALCIUM 7.9*  < > 8.2* 8.0* 8.1*  MG 2.4  < > 2.2 2.2 2.1  PHOS 3.4  --  4.1  --   --   GLUCOSE 129*  < > 122* 141* 128*  < > = values in this interval not displayed.  CBG (last 3)   Recent Labs  12/24/12 2147 12/25/12 0611 12/25/12 0739  GLUCAP 134* 123* 118*    Scheduled Meds: .  antiseptic oral rinse  1 application Mouth Rinse QID  . chlorhexidine  15 mL Mouth/Throat BID  . famotidine (PEPCID) IV  20 mg Intravenous Q24H  . insulin aspart  0-9 Units Subcutaneous Q8H  . metronidazole  500 mg Intravenous Q8H    Continuous Infusions: . sodium chloride 20 mL/hr at 12/23/12 2345  . Marland KitchenTPN (CLINIMIX-E) Adult 83 mL/hr at 12/24/12 1753   And  . fat emulsion 240 mL (12/24/12 1753)  . Marland KitchenTPN (CLINIMIX-E) Adult     And  . fat emulsion      Ian Malkin RD, LDN Inpatient Clinical Dietitian Pager: [phone] After Hours Pager: [phone]

## 2012-12-25 NOTE — Progress Notes (Signed)
Patient ID: Victor Little, male   DOB: 06-25-1945, 67 y.o.   MRN: [id number]         [facility]    Date of Admission:  12/05/2012           Day 21 metronidazole Principal Problem:   Fever Active Problems:   Clostridium difficile colitis   Pneumoperitoneum of unknown etiology   Intra-abdominal abscess   HCAP (healthcare-associated pneumonia)   Right arm pain   Organic brain syndrome (chronic)   Deaf   ARF (acute renal failure)   HTN (hypertension)   Neuropathy   Acute respiratory failure with hypoxia   Secondary thrombocytosis   [place]   . antiseptic oral rinse  1 application Mouth Rinse QID  . chlorhexidine  15 mL Mouth/Throat BID  . famotidine (PEPCID) IV  20 mg Intravenous Q24H  . insulin aspart  0-9 Units Subcutaneous Q8H  . metronidazole  500 mg Intravenous Q8H   Objective: Temp:  [97 F (36.1 C)-98 F (36.7 C)] 98 F (36.7 C) (12/10 0446) Pulse Rate:  [103] 103 (12/10 0446) Resp:  [20] 20 (12/10 0446) BP: (126-135)/(76-87) 135/76 mmHg (12/10 0446) SpO2:  [100 %] 100 % (12/10 0446) Weight:  [76 kg (167 lb 8.8 oz)] 76 kg (167 lb 8.8 oz) (12/10 0446)  General: Resting quietly in bed Skin: No rash Lungs: Clear Cor: Regular S1-S2 no murmurs Abdomen: Soft and not obviously tender Right arm: Not obviously tender today  Lab Results Lab Results  Component Value Date   WBC 12.7* 12/25/2012   HGB 11.3* 12/25/2012   HCT 33.8* 12/25/2012   MCV 85.8 12/25/2012   PLT 537* 12/25/2012    Lab Results  Component Value Date   CREATININE 0.81 12/25/2012   BUN 32* 12/25/2012   NA 136 12/25/2012   K 4.3 12/25/2012   CL 105 12/25/2012   CO2 22 12/25/2012    Lab Results  Component Value Date   ALT 10 12/25/2012   AST 18 12/25/2012   ALKPHOS 97 12/25/2012   BILITOT 0.4 12/25/2012      Microbiology: Recent Results (from the past 240 hour(s))  CULTURE, BLOOD (ROUTINE X 2)     Status: None   Collection Time    12/22/12  3:40 PM     Result Value Range Status   Specimen Description BLOOD LEFT HAND   Final   Special Requests BOTTLES DRAWN AEROBIC ONLY 1CC   Final   Culture  Setup Time     Final   Value: 12/22/2012 20:02     Performed at [facility]   Culture     Final   Value:        BLOOD CULTURE RECEIVED NO GROWTH TO DATE CULTURE WILL BE HELD FOR 5 DAYS BEFORE ISSUING A FINAL NEGATIVE REPORT     Performed at [facility]   Report Status PENDING   Incomplete  CULTURE, BLOOD (ROUTINE X 2)     Status: None   Collection Time    12/22/12  5:15 PM      Result Value Range Status   Specimen Description BLOOD RIGHT FOOT   Final   Special Requests BOTTLES DRAWN AEROBIC AND ANAEROBIC [facility]   Final   Culture  Setup Time     Final   Value: 12/22/2012 20:02     Performed at [facility]   Culture     Final   Value:  BLOOD CULTURE RECEIVED NO GROWTH TO DATE CULTURE WILL BE HELD FOR 5 DAYS BEFORE ISSUING A FINAL NEGATIVE REPORT     Performed at [facility]   Report Status PENDING   Incomplete  URINE CULTURE     Status: None   Collection Time    12/22/12  6:37 PM      Result Value Range Status   Specimen Description URINE, CLEAN CATCH   Final   Special Requests Normal   Final   Culture  Setup Time     Final   Value: 12/23/2012 00:58     Performed at [facility] Count     Final   Value: NO GROWTH     Performed at [facility]   Culture     Final   Value: NO GROWTH     Performed at [facility]   Report Status 12/24/2012 FINAL   Final  STOOL CULTURE     Status: None   Collection Time    12/24/12 11:09 AM      Result Value Range Status   Specimen Description STOOL   Final   Special Requests NONE   Final   Culture     Final   Value: Culture reincubated for better growth     Performed at [facility]   Report Status PENDING   Incomplete    Assessment: The cause of his recent isolated fever spike is unclear. I will plan on  continuing the metronidazole for 5 more days as terminal treatment for C. difficile colitis.  Plan: 1. Continue metronidazole for 5 more days  Cliffton Asters, MD [facility] for Infectious Disease [facility] [phone] pager   [phone] cell 12/25/2012, 2:09 PM

## 2012-12-25 NOTE — Progress Notes (Signed)
Physical Therapy Treatment Patient Details Name: Victor Little MRN: [id number] DOB: 1945/01/30 Today's Date: 12/25/2012 Time: 0102-7253 PT Time Calculation (min): 23 min  PT Assessment / Plan / Recommendation  History of Present Illness 67 yo male from residential group home with C diff colitis, possible SBO vs ileus and sepsis.  Hx of mental retardation, deaf, mute. 11/22 Aspiration, VDRF, CCS consulted, PCCM assumed care and pt extubated 12/1   PT Comments   Pt performed bed mobility and then assisted to recliner.  Pt more awake/alert today and smiling.  Pt does grimace when touching or moving R forearm/wrist/hand.     Follow Up Recommendations  SNF     Does the patient have the potential to tolerate intense rehabilitation     Barriers to Discharge        Equipment Recommendations  Rolling walker with 5" wheels;Wheelchair (measurements PT)    Recommendations for Other Services    Frequency Min 2X/week   Progress towards PT Goals Progress towards PT goals: Progressing toward goals  Plan Frequency needs to be updated    Precautions / Restrictions Precautions Precautions: Fall Precaution Comments: deaf, mute   Pertinent Vitals/Pain Grimacing when touching or moving R forearm/wrist/hand.  Repositioned in recliner on pillow    Mobility  Bed Mobility Bed Mobility: Rolling Left;Rolling Right Rolling Right: 1: +1 Total assist Rolling Left: 1: +1 Total assist Supine to Sit: 1: +2 Total assist Supine to Sit: Patient Percentage: 10% Details for Bed Mobility Assistance: attempted to have pt assist with rolling for hygiene as pt found with BM in supine upon arrival, pt attempted to assist a little with trunk rise Transfers Transfers: Sit to Stand;Stand to Sit;Stand Pivot Transfers Sit to Stand: 1: +2 Total assist;From bed Sit to Stand: Patient Percentage: 50% Stand to Sit: 1: +2 Total assist;To chair/3-in-1 Stand to Sit: Patient Percentage: 50% Stand Pivot Transfers: 1: +2  Total assist Stand Pivot Transfers: Patient Percentage: 50% Details for Transfer Assistance: increased time and visual cues provided, pt able to assist more today then previous visits with this therapist, assisted from bed to recliner Ambulation/Gait Ambulation/Gait Assistance: Not tested (comment)    Exercises     PT Diagnosis:    PT Problem List:   PT Treatment Interventions:     PT Goals (current goals can now be found in the care plan section)    Visit Information  Last PT Received On: 12/25/12 Assistance Needed: +2 History of Present Illness: 67 yo male from residential group home with C diff colitis, possible SBO vs ileus and sepsis.  Hx of mental retardation, deaf, mute. 11/22 Aspiration, VDRF, CCS consulted, PCCM assumed care and pt extubated 12/1    Subjective Data      Cognition  Cognition Arousal/Alertness: Awake/alert Overall Cognitive Status: Difficult to assess General Comments: pt more awake and smiling today Difficult to assess due to: Impaired communication    Balance     End of Session PT - End of Session Equipment Utilized During Treatment: Gait belt Activity Tolerance: Patient tolerated treatment well Patient left: in chair;with call bell/phone within reach Nurse Communication: Need for lift equipment (pt up in recliner, lift pad under pt for back to bed)   GP     Woodrow Drab,KATHrine E 12/25/2012, 4:02 PM Zenovia Jarred, PT, DPT 12/25/2012 Pager: [phone]

## 2012-12-25 NOTE — Progress Notes (Signed)
Speech Language Pathology Treatment: Dysphagia  Patient Details Name: Victor Little MRN: [id number] DOB: 12/14/1945 Today's Date: 12/25/2012 Time: 1200-1300 SLP Time Calculation (min): 60 min  Assessment / Plan / Recommendation Clinical Impression  Max. Assist for oral care.  Pt. Had thick dried secretions/mucous caked on all upper front teeth.  SLP brushed and literally scraped teeth clean with min-mod resistance from pt.  Lunch tray was at b/s untouched.  Pt. Requires total assist for feeding, and frequently turns head away when spoon is presented to lips, but when given time, and "coaxed," (spoon left near lips) pt. Will open mouth and accept bolus.  Feeding strategies and oral care protocol discussed with nursing.  Pt. Appears to be tolerating Dys 2 diet with Nectar thick liquids relatively well.   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  NG for small bowel obstruction;  TPN for nutrition.   Pt is afebrile;  LS diminished but clear; CXR: bibasilar atx.  Pertinent Vitals   SLP Plan  Continue with current plan of care    Recommendations Diet recommendations: Dysphagia 2 (fine chop);Nectar-thick liquid Liquids provided via: Cup;No straw Medication Administration: Crushed with puree Supervision: Staff to assist with self feeding;Full supervision/cueing for compensatory strategies;Trained caregiver to feed patient Compensations: Slow rate;Small sips/bites Postural Changes and/or Swallow Maneuvers: Seated upright 90 degrees;Upright 30-60 min after meal              Oral Care Recommendations: Oral care Q4 per protocol Follow up Recommendations: 24 hour supervision/assistance;Skilled Nursing facility Plan: Continue with current plan of care    GO     Maryjo Rochester T 12/25/2012, 1:01 PM

## 2012-12-25 NOTE — Progress Notes (Signed)
Patient ID: Victor Little, male   DOB: July 11, 1945, 67 y.o.   MRN: [id number]  [facility] PROGRESS NOTE  [name]:[id number] DOB: 1945/09/04 DOA: 12/05/2012 PCP: Pcp Not In System  Brief narrative: 67 yo male with deafness, mental retardation from a group home who presented on 11/20 to [facility] ED after multiple episodes of diarrhea and was found to have markedly elevated WBC on admission, acute renal failure, and signs consistent with sepsis which was determined to be secondary to C. Diff. He has subsequently aspirated, developed acute respiratory failure and required intubation 11/22 (extubated 12/01. In addition, his hospital stay was complicated by development of SBO, pneumoperitoneum, abdominal abscess 11/25. Pt was transferred to [facility] team 12/02 and was placed on TPN.  Assessment/Plan:  Septic shock:  - appears to be resolved, pt is hemodynamically stable, afebrile over 24 hours, WBC trending down - CXR 12/7; no change from previous CXR - Abdominal/pelvis CT 12/7; interval decrease in pneumoperitoneum  - Antibiotic regimen per infectious disease. Appreciate input  Diarrhea:  - Secondary C. Difficile - continue Flagyl  Organic brain syndrome (chronic)  - Patient is deaf, unable to communicate needs, unresponsive to any sort commands.  ARF (acute renal failure):  - Felt to be secondary to hypotension from septic shock. Resolved  Secondary hypotension:  - Resolved, secondary to septic shock  SBO (small bowel obstruction):  - Recent self DC'd NG tube on 12/8  HTN (hypertension):  - reasonable inpatient control  Aspiration pneumonia:  - CXR 12/7 ; see below for results  Acute respiratory failure with hypoxia:  - Resolved, secondary to aspiration pneumonia  Left lower quadrant abscess:  - Continue to observe as per surgery, see above for antibiotic coverage  - New CT abdomen/pelvis with contrast, and pancultured has not shown clear cause for the leukocytosis   Pneumoperitoneum:  - Looks to be self improved  Thrombocytosis  - Reactive vs infection   Contact information; sister Corinne Ports [phone](NUUVO)   Code Status: Full code  Family Communication: Medical staff attempted to contact group home; call not returned  Disposition Plan: Patient not medically stable   Consultants:   Critical Care   General surgery  ID  Procedures:   Urine culture 12/22/2012; negative (final)   Blood culture 12/7; NGTD   CT abdomen/pelvis with contrast 12/22/2012 Interval decrease in pneumoperitoneum although it does persist. The small anterior left upper pelvic mesenteric abscess seen on the previous study has resolved with no visible fluid collection at this location today. No other abscess evident. Persistent irregular wall thickening in the distal sigmoid colon and rectum with perirectal edema/inflammation. Scattered tiny perirectal lymph nodes are stable and the dominant 1.4 x 2.2 cm left pararectal nodule is not substantially changed. Imaging features could be related to the infection, inflammation or neoplasm.   CXR 12/22/2012 Streaky bibasilar atelectasis. No definite infiltrates or effusions. Pneumoperitoneum again demonstrated.   Intubation the patient 11/22-12/1   Placement of left IJ central line 11/22  Right wrist x-ray 12/18/2012 No acute bony abnormality of the right wrist is demonstrated on this limited two view series   Antibiotics:   IV Flagyl 11/20 >> 12/4   Zosyn 11/20 >> 12/4   Enteral Vancomycin 11/20 >> 12/3   IV Vancomycin 11/24 >> 12/01   Micafungin 11/26 >> 12/4   Flagyl po 12/4 >> stopped 12/5   Metronidazole IV 12/5>>>   Rocephin IV 12/4 >> stopped 12/8   HPI/Subjective: No events overnight.   Objective: Filed  Vitals:   12/24/12 1300 12/24/12 2052 12/25/12 0446 12/25/12 1459  BP: 145/86 126/87 135/76 126/73  Pulse: 103 103 103 105  Temp: 98.3 F (36.8 C) 97 F (36.1 C) 98 F (36.7 C) 97.5 F (36.4 C)   TempSrc: Axillary Axillary Axillary Axillary  Resp: 18 20 20 20   Height:      Weight:   76 kg (167 lb 8.8 oz)   SpO2: 100% 100% 100% 98%    Intake/Output Summary (Last 24 hours) at 12/25/12 1737 Last data filed at 12/25/12 1500  Gross per 24 hour  Intake 3059.13 ml  Output   1625 ml  Net 1434.13 ml    Exam:   General:  Pt sitting in bed, turning head left and right, non verbal  Cardiovascular: Regular rate and rhythm, S1/S2, no murmurs, no rubs, no gallops  Respiratory: Clear to auscultation bilaterally, diminished breath sounds at bases   Abdomen: Soft, non tender, non distended, bowel sounds present, no guarding  Extremities: No edema, pulses DP and PT palpable bilaterally  Data Reviewed: Basic Metabolic Panel:  Recent Labs Lab 12/19/12 0416  12/21/12 0600 12/22/12 0332 12/23/12 0414 12/24/12 0345 12/25/12 0605  NA 132*  < > 134* 134* 136 137 136  K 4.3  < > 4.7 4.6 4.5 4.2 4.3  CL 100  < > 102 102 103 104 105  CO2 23  < > 23 23 23 21 22   GLUCOSE 129*  < > 141* 135* 122* 141* 128*  BUN 26*  < > 29* 31* 32* 31* 32*  CREATININE 1.00  < > 0.94 0.96 0.88 0.88 0.81  CALCIUM 7.9*  < > 8.3* 8.4 8.2* 8.0* 8.1*  MG 2.4  --  2.3 2.2 2.2 2.2 2.1  PHOS 3.4  --   --   --  4.1  --   --   < > = values in this interval not displayed. Liver Function Tests:  Recent Labs Lab 12/21/12 0600 12/22/12 0332 12/23/12 0414 12/24/12 0345 12/25/12 0605  AST 22 20 17 15 18   ALT 12 11 9 9 10   ALKPHOS 94 97 90 94 97  BILITOT 0.4 0.5 0.4 0.4 0.4  PROT 7.2 7.2 6.9 6.8 6.6  ALBUMIN 2.3* 2.2* 2.1* 2.1* 1.9*   CBC:  Recent Labs Lab 12/21/12 0600 12/22/12 0332 12/23/12 0414 12/24/12 0345 12/25/12 0605  WBC 15.0* 16.3* 15.4* 14.4* 12.7*  NEUTROABS 11.0* 12.2* 11.2* 11.4* 9.8*  HGB 12.3* 12.3* 11.9* 11.6* 11.3*  HCT 36.0* 36.7* 35.5* 34.6* 33.8*  MCV 84.9 85.3 86.0 85.4 85.8  PLT 684* 756* 681* 687* 537*   CBG:  Recent Labs Lab 12/24/12 1336 12/24/12 2147  12/25/12 0611 12/25/12 0739 12/25/12 1505  GLUCAP 129* 134* 123* 118* 102*    Recent Results (from the past 240 hour(s))  CULTURE, BLOOD (ROUTINE X 2)     Status: None   Collection Time    12/22/12  3:40 PM      Result Value Range Status   Specimen Description BLOOD LEFT HAND   Final   Special Requests BOTTLES DRAWN AEROBIC ONLY 1CC   Final   Culture  Setup Time     Final   Value: 12/22/2012 20:02     Performed at [facility]   Culture     Final   Value:        BLOOD CULTURE RECEIVED NO GROWTH TO DATE CULTURE WILL BE HELD FOR 5 DAYS BEFORE ISSUING A FINAL NEGATIVE REPORT  Performed at [facility]   Report Status PENDING   Incomplete  CULTURE, BLOOD (ROUTINE X 2)     Status: None   Collection Time    12/22/12  5:15 PM      Result Value Range Status   Specimen Description BLOOD RIGHT FOOT   Final   Special Requests BOTTLES DRAWN AEROBIC AND ANAEROBIC [facility]   Final   Culture  Setup Time     Final   Value: 12/22/2012 20:02     Performed at [facility]   Culture     Final   Value:        BLOOD CULTURE RECEIVED NO GROWTH TO DATE CULTURE WILL BE HELD FOR 5 DAYS BEFORE ISSUING A FINAL NEGATIVE REPORT     Performed at [facility]   Report Status PENDING   Incomplete  URINE CULTURE     Status: None   Collection Time    12/22/12  6:37 PM      Result Value Range Status   Specimen Description URINE, CLEAN CATCH   Final   Special Requests Normal   Final   Culture  Setup Time     Final   Value: 12/23/2012 00:58     Performed at [facility] Count     Final   Value: NO GROWTH     Performed at [facility]   Culture     Final   Value: NO GROWTH     Performed at [facility]   Report Status 12/24/2012 FINAL   Final  STOOL CULTURE     Status: None   Collection Time    12/24/12 11:09 AM      Result Value Range Status   Specimen Description STOOL   Final   Special Requests NONE   Final   Culture     Final    Value: Culture reincubated for better growth     Performed at [facility]   Report Status PENDING   Incomplete     Scheduled Meds: . antiseptic oral rinse  1 application Mouth Rinse QID  . chlorhexidine  15 mL Mouth/Throat BID  . famotidine (PEPCID) IV  20 mg Intravenous Q24H  . feeding supplement (ENSURE)  1 Container Oral Q24H  . insulin aspart  0-9 Units Subcutaneous Q8H  . metronidazole  500 mg Intravenous Q8H   Continuous Infusions: . sodium chloride 20 mL/hr at 12/23/12 2345  . Marland KitchenTPN (CLINIMIX-E) Adult 83 mL/hr at 12/24/12 1753   And  . fat emulsion 240 mL (12/24/12 1753)  . Marland KitchenTPN (CLINIMIX-E) Adult     And  . fat emulsion     Debbora Presto, MD  [facility] Pager [phone]  If 7PM-7AM, please contact night-coverage [email or web address] Password [facility] 12/25/2012, 5:37 PM   LOS: 20 days

## 2012-12-26 LAB — CBC WITH DIFFERENTIAL/PLATELET
Basophils Absolute: 0.1 10*3/uL (ref 0.0–0.1)
Basophils Relative: 1 % (ref 0–1)
Eosinophils Absolute: 0.2 10*3/uL (ref 0.0–0.7)
HCT: 38.4 % — ABNORMAL LOW (ref 39.0–52.0)
Hemoglobin: 12.9 g/dL — ABNORMAL LOW (ref 13.0–17.0)
Lymphocytes Relative: 13 % (ref 12–46)
Lymphs Abs: 1.5 10*3/uL (ref 0.7–4.0)
MCH: 28.7 pg (ref 26.0–34.0)
MCHC: 33.6 g/dL (ref 30.0–36.0)
MCV: 85.5 fL (ref 78.0–100.0)
Monocytes Absolute: 1.4 10*3/uL — ABNORMAL HIGH (ref 0.1–1.0)
Monocytes Relative: 12 % (ref 3–12)
Neutro Abs: 8.6 10*3/uL — ABNORMAL HIGH (ref 1.7–7.7)
RDW: 14.7 % (ref 11.5–15.5)
WBC: 11.8 10*3/uL — ABNORMAL HIGH (ref 4.0–10.5)

## 2012-12-26 LAB — COMPREHENSIVE METABOLIC PANEL
ALT: 12 U/L (ref 0–53)
AST: 23 U/L (ref 0–37)
Albumin: 2 g/dL — ABNORMAL LOW (ref 3.5–5.2)
Alkaline Phosphatase: 104 U/L (ref 39–117)
BUN: 29 mg/dL — ABNORMAL HIGH (ref 6–23)
CO2: 21 mEq/L (ref 19–32)
Chloride: 105 mEq/L (ref 96–112)
GFR calc Af Amer: >90 mL/min (ref >90)
GFR calc non Af Amer: >90 mL/min (ref >90)
Potassium: 4.8 mEq/L (ref 3.5–5.1)
Total Bilirubin: 0.3 mg/dL (ref 0.3–1.2)

## 2012-12-26 LAB — GLUCOSE, CAPILLARY
Glucose-Capillary: 100 mg/dL — ABNORMAL HIGH (ref 70–99)
Glucose-Capillary: 106 mg/dL — ABNORMAL HIGH (ref 70–99)

## 2012-12-26 MED ORDER — TRACE MINERALS CR-CU-F-FE-I-MN-MO-SE-ZN IV SOLN
INTRAVENOUS | Status: AC
  Administered 2012-12-26: 19:00:00 via INTRAVENOUS
  Filled 2012-12-26: qty 2000

## 2012-12-26 MED ORDER — FAT EMULSION 20 % IV EMUL
240.0000 mL | INTRAVENOUS | Status: AC
  Administered 2012-12-26: 240 mL via INTRAVENOUS
  Filled 2012-12-26: qty 250

## 2012-12-26 NOTE — Progress Notes (Signed)
Patient ID: Victor Little, male   DOB: 03-19-1945, 67 y.o.   MRN: [id number]         [facility]    Date of Admission:  12/05/2012           Day 22 metronidazole  Principal Problem:   Fever Active Problems:   Clostridium difficile colitis   Pneumoperitoneum of unknown etiology   Intra-abdominal abscess   HCAP (healthcare-associated pneumonia)   Right arm pain   Organic brain syndrome (chronic)   Deaf   ARF (acute renal failure)   HTN (hypertension)   Neuropathy   Acute respiratory failure with hypoxia   Secondary thrombocytosis   [place]   . antiseptic oral rinse  1 application Mouth Rinse QID  . chlorhexidine  15 mL Mouth/Throat BID  . famotidine (PEPCID) IV  20 mg Intravenous Q24H  . feeding supplement (ENSURE)  1 Container Oral Q24H  . insulin aspart  0-9 Units Subcutaneous Q8H  . metronidazole  500 mg Intravenous Q8H    Objective: Temp:  [97.5 F (36.4 C)-98.9 F (37.2 C)] 98.1 F (36.7 C) (12/11 1338) Pulse Rate:  [90-105] 92 (12/11 1338) Resp:  [18-20] 18 (12/11 1338) BP: (119-154)/(54-88) 119/54 mmHg (12/11 1338) SpO2:  [96 %-100 %] 96 % (12/11 1338) Weight:  [76.1 kg (167 lb 12.3 oz)] 76.1 kg (167 lb 12.3 oz) (12/11 0865)  General: Alert and apparently comfortable laying in bed Skin: No rash Lungs: Clear Cor: Regular S1-S2 no murmurs Abdomen: Some intermittent grimacing with palpation. His nurse reports that he said 3 small watery bowel movements today Some grimacing with movement of right arm  Lab Results Lab Results  Component Value Date   WBC 11.8* 12/26/2012   HGB 12.9* 12/26/2012   HCT 38.4* 12/26/2012   MCV 85.5 12/26/2012   PLT 454* 12/26/2012    Lab Results  Component Value Date   CREATININE 0.76 12/26/2012   BUN 29* 12/26/2012   NA 134* 12/26/2012   K 4.8 12/26/2012   CL 105 12/26/2012   CO2 21 12/26/2012    Lab Results  Component Value Date   ALT 12 12/26/2012   AST 23 12/26/2012   ALKPHOS 104  12/26/2012   BILITOT 0.3 12/26/2012      Microbiology: Recent Results (from the past 240 hour(s))  CULTURE, BLOOD (ROUTINE X 2)     Status: None   Collection Time    12/22/12  3:40 PM      Result Value Range Status   Specimen Description BLOOD LEFT HAND   Final   Special Requests BOTTLES DRAWN AEROBIC ONLY 1CC   Final   Culture  Setup Time     Final   Value: 12/22/2012 20:02     Performed at [facility]   Culture     Final   Value:        BLOOD CULTURE RECEIVED NO GROWTH TO DATE CULTURE WILL BE HELD FOR 5 DAYS BEFORE ISSUING A FINAL NEGATIVE REPORT     Performed at [facility]   Report Status PENDING   Incomplete  CULTURE, BLOOD (ROUTINE X 2)     Status: None   Collection Time    12/22/12  5:15 PM      Result Value Range Status   Specimen Description BLOOD RIGHT FOOT   Final   Special Requests BOTTLES DRAWN AEROBIC AND ANAEROBIC [facility] EACH   Final   Culture  Setup Time     Final  Value: 12/22/2012 20:02     Performed at [facility]   Culture     Final   Value:        BLOOD CULTURE RECEIVED NO GROWTH TO DATE CULTURE WILL BE HELD FOR 5 DAYS BEFORE ISSUING A FINAL NEGATIVE REPORT     Performed at [facility]   Report Status PENDING   Incomplete  URINE CULTURE     Status: None   Collection Time    12/22/12  6:37 PM      Result Value Range Status   Specimen Description URINE, CLEAN CATCH   Final   Special Requests Normal   Final   Culture  Setup Time     Final   Value: 12/23/2012 00:58     Performed at [facility] Count     Final   Value: NO GROWTH     Performed at [facility]   Culture     Final   Value: NO GROWTH     Performed at [facility]   Report Status 12/24/2012 FINAL   Final  STOOL CULTURE     Status: None   Collection Time    12/24/12 11:09 AM      Result Value Range Status   Specimen Description STOOL   Final   Special Requests NONE   Final   Culture     Final   Value: NO SUSPICIOUS  COLONIES, CONTINUING TO HOLD     Performed at [facility]   Report Status PENDING   Incomplete    Assessment: He has not had any further fever. He is not able to take oral medications consistently follow continue IV metronidazole for now.  Plan: 1. Continue IV metronidazole  Cliffton Asters, MD [facility] [phone] pager   [phone] cell 12/26/2012, 2:08 PM

## 2012-12-26 NOTE — Progress Notes (Signed)
Patient ID: Victor Little, male   DOB: [birth year], 67 y.o.   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 ED after multiple episodes of diarrhea and was found to have markedly elevated WBC on admission, acute renal failure, and signs consistent with sepsis which was determined to be secondary to C. Diff. He has subsequently aspirated, developed acute respiratory failure and required intubation 11/22 (extubated 12/01. In addition, his hospital stay was complicated by development of SBO, pneumoperitoneum, abdominal abscess 11/25. Pt was transferred to [facility] team 12/02 and was placed on TPN.   Assessment/Plan:  Septic shock:  - appears to be resolved, pt is hemodynamically stable, afebrile over 48 hours, WBC trending down  - CXR 12/7; no change from previous CXR  - Abdominal/pelvis CT 12/7; interval decrease in pneumoperitoneum  - Antibiotic regimen per infectious disease. Appreciate input  - plan on d/c to SNF in AM on TPN Diarrhea:  - Secondary C. Difficile  - continue Flagyl for 4 more days Organic brain syndrome (chronic)  - Patient is deaf, unable to communicate needs, unresponsive to any sort commands.  ARF (acute renal failure):  - Felt to be secondary to hypotension from septic shock. Resolved  Secondary hypotension:  - Resolved, secondary to septic shock  SBO (small bowel obstruction):  - Recent self DC'd NG tube on 12/8  HTN (hypertension):  - reasonable inpatient control  Aspiration pneumonia:  - CXR 12/7 ; see below for results  Acute respiratory failure with hypoxia:  - Resolved, secondary to aspiration pneumonia  Left lower quadrant abscess:  - Continue to observe as per surgery, see above for antibiotic coverage  - New CT abdomen/pelvis with contrast, and pancultured has  not shown clear cause for the leukocytosis  Pneumoperitoneum:  - Looks to be self improved  Thrombocytosis  - Reactive vs infection   Contact information; sister Corinne Ports [phone](HQION)   Code Status: Full code  Family Communication: Medical staff attempted to contact group home; call still not returned  Disposition Plan: Patient not medically stable   Consultants:  Critical Care  General surgery  ID Procedures:  Urine culture 12/22/2012; negative (final)  Blood culture 12/7; NGTD  CT abdomen/pelvis with contrast 12/22/2012 Interval decrease in pneumoperitoneum although it does persist. The small anterior left upper pelvic mesenteric abscess seen on the previous study has resolved with no visible fluid collection at this location today. No other abscess evident. Persistent irregular wall thickening in the distal sigmoid colon and rectum with perirectal edema/inflammation. Scattered tiny perirectal lymph nodes are stable and the dominant 1.4 x 2.2 cm left pararectal nodule is not substantially changed. Imaging features could be related to the infection, inflammation or neoplasm.  CXR 12/22/2012 Streaky bibasilar atelectasis. No definite infiltrates or effusions. Pneumoperitoneum again demonstrated.  Intubation the patient 11/22-12/1  Placement of left IJ central line 11/22  Right wrist x-ray 12/18/2012 No acute bony abnormality of the right wrist is demonstrated on this limited two view series  Antibiotics:  IV Flagyl 11/20 >> 12/4  Zosyn 11/20 >> 12/4  Enteral Vancomycin 11/20 >> 12/3  IV Vancomycin 11/24 >> 12/01  Micafungin 11/26 >> 12/4  Flagyl po 12/4 >> stopped 12/5  Metronidazole IV 12/5>>>  Rocephin IV 12/4 >> stopped 12/8   HPI/Subjective: No events overnight. Still not eating  on his own.   Objective: Filed Vitals:   12/25/12 1459 12/25/12 2250 12/26/12 0653 12/26/12 1338  BP: 126/73 135/79 154/88 119/54  Pulse: 105 90 94 92  Temp: 97.5 F (36.4 C) 98.4 F (36.9  C) 98.9 F (37.2 C) 98.1 F (36.7 C)  TempSrc: Axillary Axillary Axillary Axillary  Resp: 20 20 20 18   Height:      Weight:   76.1 kg (167 lb 12.3 oz)   SpO2: 98% 99% 100% 96%    Intake/Output Summary (Last 24 hours) at 12/26/12 1547 Last data filed at 12/26/12 1545  Gross per 24 hour  Intake 1793.8 ml  Output   1225 ml  Net  568.8 ml    Exam:   General:  Pt is somnolent and non verbal  Cardiovascular: Regular rate and rhythm, S1/S2, no murmurs, no rubs, no gallops  Respiratory: Clear to auscultation bilaterally, diminished breath sounds at bases  Abdomen: Soft, non tender, non distended, bowel sounds present, no guarding   Data Reviewed: Basic Metabolic Panel:  Recent Labs Lab 12/22/12 0332 12/23/12 0414 12/24/12 0345 12/25/12 0605 12/26/12 0747  NA 134* 136 137 136 134*  K 4.6 4.5 4.2 4.3 4.8  CL 102 103 104 105 105  CO2 23 23 21 22 21   GLUCOSE 135* 122* 141* 128* 118*  BUN 31* 32* 31* 32* 29*  CREATININE 0.96 0.88 0.88 0.81 0.76  CALCIUM 8.4 8.2* 8.0* 8.1* 7.9*  MG 2.2 2.2 2.2 2.1 2.1  PHOS  --  4.1  --   --  4.0   Liver Function Tests:  Recent Labs Lab 12/22/12 0332 12/23/12 0414 12/24/12 0345 12/25/12 0605 12/26/12 0747  AST 20 17 15 18 23   ALT 11 9 9 10 12   ALKPHOS 97 90 94 97 104  BILITOT 0.5 0.4 0.4 0.4 0.3  PROT 7.2 6.9 6.8 6.6 6.9  ALBUMIN 2.2* 2.1* 2.1* 1.9* 2.0*   CBC:  Recent Labs Lab 12/22/12 0332 12/23/12 0414 12/24/12 0345 12/25/12 0605 12/26/12 0747  WBC 16.3* 15.4* 14.4* 12.7* 11.8*  NEUTROABS 12.2* 11.2* 11.4* 9.8* 8.6*  HGB 12.3* 11.9* 11.6* 11.3* 12.9*  HCT 36.7* 35.5* 34.6* 33.8* 38.4*  MCV 85.3 86.0 85.4 85.8 85.5  PLT 756* 681* 687* 537* 454*   CBG:  Recent Labs Lab 12/25/12 0739 12/25/12 1505 12/25/12 2242 12/26/12 0651 12/26/12 1337  GLUCAP 118* 102* 122* 100* 106*    Recent Results (from the past 240 hour(s))  CULTURE, BLOOD (ROUTINE X 2)     Status: None   Collection Time    12/22/12  3:40  PM      Result Value Range Status   Specimen Description BLOOD LEFT HAND   Final   Special Requests BOTTLES DRAWN AEROBIC ONLY 1CC   Final   Culture  Setup Time     Final   Value: 12/22/2012 20:02     Performed at [facility]   Culture     Final   Value:        BLOOD CULTURE RECEIVED NO GROWTH TO DATE CULTURE WILL BE HELD FOR 5 DAYS BEFORE ISSUING A FINAL NEGATIVE REPORT     Performed at [facility]   Report Status PENDING   Incomplete  CULTURE, BLOOD (ROUTINE X 2)     Status: None   Collection Time    12/22/12  5:15 PM      Result Value Range Status   Specimen Description BLOOD RIGHT FOOT   Final  Special Requests BOTTLES DRAWN AEROBIC AND ANAEROBIC [facility]   Final   Culture  Setup Time     Final   Value: 12/22/2012 20:02     Performed at [facility]   Culture     Final   Value:        BLOOD CULTURE RECEIVED NO GROWTH TO DATE CULTURE WILL BE HELD FOR 5 DAYS BEFORE ISSUING A FINAL NEGATIVE REPORT     Performed at [facility]   Report Status PENDING   Incomplete  URINE CULTURE     Status: None   Collection Time    12/22/12  6:37 PM      Result Value Range Status   Specimen Description URINE, CLEAN CATCH   Final   Special Requests Normal   Final   Culture  Setup Time     Final   Value: 12/23/2012 00:58     Performed at [facility] Count     Final   Value: NO GROWTH     Performed at [facility]   Culture     Final   Value: NO GROWTH     Performed at [facility]   Report Status 12/24/2012 FINAL   Final  STOOL CULTURE     Status: None   Collection Time    12/24/12 11:09 AM      Result Value Range Status   Specimen Description STOOL   Final   Special Requests NONE   Final   Culture     Final   Value: NO SUSPICIOUS COLONIES, CONTINUING TO HOLD     Performed at [facility]   Report Status PENDING   Incomplete     Scheduled Meds: . antiseptic oral rinse  1 application Mouth Rinse QID  .  chlorhexidine  15 mL Mouth/Throat BID  . famotidine (PEPCID) IV  20 mg Intravenous Q24H  . feeding supplement (ENSURE)  1 Container Oral Q24H  . insulin aspart  0-9 Units Subcutaneous Q8H  . metronidazole  500 mg Intravenous Q8H   Continuous Infusions: . sodium chloride 20 mL/hr at 12/23/12 2345  . Marland KitchenTPN (CLINIMIX-E) Adult 83 mL/hr at 12/25/12 1832   And  . fat emulsion 240 mL (12/25/12 1833)  . Marland KitchenTPN (CLINIMIX-E) Adult     And  . fat emulsion     Debbora Presto, MD  [facility] Pager [phone]  If 7PM-7AM, please contact night-coverage [email or web address] Password [email or web address] 12/26/2012, 3:47 PM   LOS: 21 days

## 2012-12-26 NOTE — Progress Notes (Signed)
CSW received call from Dr. Izola Price that patient will most likely be discharged tomorrow. CSW spoke with patient's sister/guardian, Liborio Nixon (ph#: [phone]) and provided bed offers - she accepted bed offer at [facility]. Rokkell @ [facility] made aware, arrangements made for TPN should patient not be weaned tonight. CSW will follow-up tomorrow.   Clinical Social Work Department CLINICAL SOCIAL WORK PLACEMENT NOTE 12/26/2012  Patient:  Victor Little, Victor Little  Account Number:  [id number] Admit date:  12/05/2012  Clinical Social Worker:  Orpah Greek  Date/time:  12/19/2012 10:54 AM  Clinical Social Work is seeking post-discharge placement for this patient at the following level of care:   SKILLED NURSING   (*CSW will update this form in Epic as items are completed)   12/19/2012  Patient/family provided with [facility] Department of Clinical Social Work's list of facilities offering this level of care within the geographic area requested by the patient (or if unable, by the patient's family).  12/19/2012  Patient/family informed of their freedom to choose among providers that offer the needed level of care, that participate in Medicare, Medicaid or managed care program needed by the patient, have an available bed and are willing to accept the patient.  12/19/2012  Patient/family informed of [facility]' ownership interest in [facility], as well as of the fact that they are under no obligation to receive care at this facility.  PASARR submitted to EDS on 12/19/2012 PASARR number received from EDS on 12/24/2012  FL2 transmitted to all facilities in geographic area requested by pt/family on  12/19/2012 FL2 transmitted to all facilities within larger geographic area on   Patient informed that his/her managed care company has contracts with or will negotiate with  certain facilities, including the following:     Patient/family informed of bed offers received:   12/26/2012 Patient chooses bed at [facility] Physician recommends and patient chooses bed at    Patient to be transferred to [facility] on   Patient to be transferred to facility by   The following physician request were entered in Epic:   Additional Comments:   Unice Bailey, LCSW [facility] Clinical Social Worker cell #: [phone]

## 2012-12-26 NOTE — Progress Notes (Addendum)
PARENTERAL NUTRITION CONSULT NOTE - Follow-up  Pharmacy Consult for TNA Indication: SBO vs ileus  No Known Allergies  Patient Measurements: Height: 6' (182.9 cm) Weight: 167 lb 12.3 oz (76.1 kg) IBW/kg (Calculated) : 77.6 Adjusted Body Weight: 81 kg Weight PTA: 92 kg  Vital Signs: Temp: 98.9 F (37.2 C) (12/11 0653) Temp src: Axillary (12/11 0653) BP: 154/88 mmHg (12/11 0653) Pulse Rate: 94 (12/11 0653) Intake/Output from previous day: 12/10 0701 - 12/11 0700 In: 2794.9 [P.O.:180; I.V.:145.3; IV Piggyback:250; TPN:2219.6] Out: 1225 [Urine:1225]  Labs:  Recent Labs  12/24/12 0345 12/25/12 0605 12/26/12 0747  WBC 14.4* 12.7* PENDING  HGB 11.6* 11.3* 12.9*  HCT 34.6* 33.8* 38.4*  PLT 687* 537* PENDING     Recent Labs  12/24/12 0345 12/25/12 0605 12/26/12 0747  NA 137 136 134*  K 4.2 4.3 4.8  CL 104 105 105  CO2 21 22 21   GLUCOSE 141* 128* 118*  BUN 31* 32* 29*  CREATININE 0.88 0.81 0.76  CALCIUM 8.0* 8.1* 7.9*  MG 2.2 2.1 2.1  PHOS  --   --  4.0  PROT 6.8 6.6 6.9  ALBUMIN 2.1* 1.9* 2.0*  AST 15 18 PENDING  ALT 9 10 12   ALKPHOS 94 97 104  BILITOT 0.4 0.4 0.3   Estimated Creatinine Clearance: 97.8 ml/min (by C-G formula based on Cr of 0.76).    Recent Labs  12/25/12 1505 12/25/12 2242 12/26/12 0651  GLUCAP 102* 122* 100*   Insulin Requirements in the past 24 hours:   No hx of DM  1 units Novolog required - on sensitive SSI q8h  Nutritional Goals/ 24 hr:   RD recommendations (updated 12/3):  2100-2300 KCal/day, 90-105 grams protein/day, Fluid 2.1-2.3 L/day   Goal rate Clinimix E 5/20 of 36ml/hr + 20% lipids at 7ml/hr to deliver 100g protein and 2233 KCal per 24 hr.  Current Nutrition:   Clinimix  E 5/20 78ml/hr + 20% lipids 51ml/hr.  Dysphagia diet starting 12/9  IVF:  NS @ 20 ml/hr  Assessment:  [name] with hx mental retardation, deafness, HTN, admitted from group home 11/20 after 12 hours of vomiting, diarrhea, hypotension, and  c/o RLQ pain.  Stool was +Cdiff.  Pneumoperitoneum on CT, probable ruptured viscus, ileus vs partial SBO, colitis.  Tube feed not begun d/t suspected GI perf, began TNA 11/27.  CT showed improvement but new small abscess on 11/30.  Patient self-removed NGT on 12/5.  12/11: Some PO intake recorded yesterday.  Would consider weaning TNA to help stimulate appetite.  Repeat CT abd/pelvisb 12/7 with decrease in pneumoperitoneum, LU pelvic mesenteric abscess has resolved, no other abscess evident. Labs are stable.   Labs  Glucose: at goal < 150  Electrolytes: Na 134, other lytes WNL Renal: SCr stable/wnl, UOP 0.7 ml/kg/hr LFTs: wnl  TGs: wnl Prealbumin: 7.8 (11/28), 17.5 (12/1), 12.7 (12/8)  TPN Access: Triple lumen PICC placed 12/3 (TL CVC removed 12/3) TPN day#: 15  Plan:    Continue Clinimix E 5/20 at goal rate of 83 ml/hr.    TNA to contain standard multivitamins and trace elements.  Continue lipids 20% at 10 ml/hr.  Continue SSI sensitive scale q8h  TNA lab panels on Mondays & Thursdays.  F/u plan for weaning TNA which may help improve appetite.  Clance Boll, PharmD, BCPS Pager: [phone] 12/26/2012 8:32 AM   Addendum: 12/26/2012 11:58 AM MD hopeful to discharge patient home soon.  In regards to TNA for discharge planning, would continue current rate Clinimix  E 5/20 of 33ml/hr + 20% lipids at 75ml/hr to deliver 100g protein and 2233 KCal per 24 hr to provide 100% nutrition at discharge since patient still has minimal PO intake recorded but would attempt to wean TNA down (try decrease to 50 ml/hr) tonight or tomorrow to see if this helps increase his appetite.   Clance Boll, PharmD, BCPS Pager: [phone] 12/26/2012 12:03 PM

## 2012-12-27 LAB — CBC WITH DIFFERENTIAL/PLATELET
Basophils Absolute: 0 10*3/uL (ref 0.0–0.1)
Eosinophils Absolute: 0.2 10*3/uL (ref 0.0–0.7)
Eosinophils Relative: 2 % (ref 0–5)
Lymphocytes Relative: 10 % — ABNORMAL LOW (ref 12–46)
MCH: 28.4 pg (ref 26.0–34.0)
MCV: 85.4 fL (ref 78.0–100.0)
Platelets: 537 10*3/uL — ABNORMAL HIGH (ref 150–400)
RDW: 14.5 % (ref 11.5–15.5)
WBC: 14.2 10*3/uL — ABNORMAL HIGH (ref 4.0–10.5)

## 2012-12-27 LAB — COMPREHENSIVE METABOLIC PANEL
ALT: 13 U/L (ref 0–53)
Albumin: 2 g/dL — ABNORMAL LOW (ref 3.5–5.2)
Alkaline Phosphatase: 107 U/L (ref 39–117)
BUN: 28 mg/dL — ABNORMAL HIGH (ref 6–23)
CO2: 23 mEq/L (ref 19–32)
Chloride: 106 mEq/L (ref 96–112)
GFR calc non Af Amer: >90 mL/min (ref >90)
Potassium: 4.2 mEq/L (ref 3.5–5.1)
Total Bilirubin: 0.3 mg/dL (ref 0.3–1.2)
Total Protein: 6.8 g/dL (ref 6.0–8.3)

## 2012-12-27 LAB — MAGNESIUM: Magnesium: 2.1 mg/dL (ref 1.5–2.5)

## 2012-12-27 MED ORDER — ALBUTEROL SULFATE (5 MG/ML) 0.5% IN NEBU: 2.5000 mg | INHALATION_SOLUTION | RESPIRATORY_TRACT | Status: DC | PRN

## 2012-12-27 MED ORDER — ENSURE PUDDING PO PUDG: 1.0000 | ORAL | Status: DC

## 2012-12-27 MED ORDER — METRONIDAZOLE IN NACL 5-0.79 MG/ML-% IV SOLN: 500.0000 mg | Freq: Three times a day (TID) | INTRAVENOUS | Status: DC

## 2012-12-27 MED ORDER — HEPARIN SOD (PORK) LOCK FLUSH 100 UNIT/ML IV SOLN
250.0000 [IU] | INTRAVENOUS | Status: AC | PRN
  Administered 2012-12-27 (×2): 250 [IU]

## 2012-12-27 NOTE — Progress Notes (Signed)
[facility], patient alert/awake. Patient discharge packet prepared by CSW and given to EMS transporter for the facility. Stage II on sacrum 0.2X0.1 covered with allevyn. No distress noted. Reported off to nurse Misty Stanley at [facility] care prior to transport to facility; dicharged with PICC for TNA. PICC site clean/dry/intact

## 2012-12-27 NOTE — Plan of Care (Signed)
Problem: Discharge Progression Outcomes Goal: Activity appropriate for discharge plan Outcome: Progressing Discharge to SNF to F/U with plan of care

## 2012-12-27 NOTE — Progress Notes (Deleted)
Clinical Social Work Department CLINICAL SOCIAL WORK PLACEMENT NOTE 12/27/2012  Patient:  Victor Little,Victor Little  Account Number:  [id number] Admit date:  12/16/2012  Clinical Social Worker:  Tyrelle Raczka FOLEY, LCSWA  Date/time:  12/27/2012 11:33 AM  Clinical Social Work is seeking post-discharge placement for this patient at the following level of care:   SKILLED NURSING   (*CSW will update this form in Epic as items are completed)   12/27/2012  Patient/family provided with [facility] Department of Clinical Social Work's list of facilities offering this level of care within the geographic area requested by the patient (or if unable, by the patient's family).  12/27/2012  Patient/family informed of their freedom to choose among providers that offer the needed level of care, that participate in Medicare, Medicaid or managed care program needed by the patient, have an available bed and are willing to accept the patient.  12/27/2012  Patient/family informed of [facility]' ownership interest in [facility], as well as of the fact that they are under no obligation to receive care at this facility.  PASARR submitted to EDS on 12/27/2012 PASARR number received from EDS on 12/27/2012  FL2 transmitted to all facilities in geographic area requested by pt/family on  12/27/2012 FL2 transmitted to all facilities within larger geographic area on   Patient informed that his/her managed care company has contracts with or will negotiate with  certain facilities, including the following:     Patient/family informed of bed offers received:   Patient chooses bed at  Physician recommends and patient chooses bed at    Patient to be transferred to  on   Patient to be transferred to facility by   The following physician request were entered in Epic:   Additional Comments:   Alvester Eads Foley, LCSW [facility] Clinical Social Worker cell #: [phone]  

## 2012-12-27 NOTE — Plan of Care (Signed)
Problem: Discharge Progression Outcomes Goal: Tolerating diet Outcome: Completed/Met Date Met:  12/27/12 TNA

## 2012-12-27 NOTE — Discharge Summary (Signed)
Physician Discharge Summary  HUSTON STONEHOCKER WUJ:[id number] DOB: May 20, 1945 DOA: 12/05/2012  PCP: Pcp Not In System  Admit date: 12/05/2012 Discharge date: 12/27/2012  Recommendations for Outpatient Follow-up:  1. Pt will need to follow up with PCP in 2-3 weeks post discharge 2. Please obtain BMP to evaluate electrolytes and kidney function 3. Please also check CBC to evaluate Hg and Hct levels 4. Pt discharged on TNA, will continue Flagyl for three more days post discharge   Discharge Diagnoses: C. Diff Principal Problem:   Fever Active Problems:   Organic brain syndrome (chronic)   Deaf   ARF (acute renal failure)   HTN (hypertension)   Neuropathy   Clostridium difficile colitis   Acute respiratory failure with hypoxia   Pneumoperitoneum of unknown etiology   Intra-abdominal abscess   Secondary thrombocytosis   HCAP (healthcare-associated pneumonia)   Right arm pain   Mute  Discharge Condition: Stable  Diet recommendation: Heart healthy diet discussed in details   Brief narrative:  67 yo male with deafness, mental retardation from a group home who presented on 11/20 to [facility] ED after multiple episodes of diarrhea and was found to have markedly elevated WBC on admission, acute renal failure, and signs consistent with sepsis which was determined to be secondary to C. Diff. He has subsequently aspirated, developed acute respiratory failure and required intubation 11/22 (extubated 12/01. In addition, his hospital stay was complicated by development of SBO, pneumoperitoneum, abdominal abscess 11/25. Pt was transferred to [facility] team 12/02 and was placed on TPN.   Assessment/Plan:  Septic shock:  - appears to be resolved, pt is hemodynamically stable, afebrile over 48 hours, WBC trending down  - CXR 12/7; no change from previous CXR  - Abdominal/pelvis CT 12/7; interval decrease in pneumoperitoneum  - Antibiotic regimen per infectious disease. Appreciate input continue for 3 more  days post discharge  - plan on d/c to SNF in AM on TPN  Diarrhea:  - Secondary C. Difficile  - continue Flagyl for 3 more days  Organic brain syndrome (chronic)  - Patient is deaf, unable to communicate needs, unresponsive to any sort commands.  ARF (acute renal failure):  - Felt to be secondary to hypotension from septic shock. Resolved  Secondary hypotension:  - Resolved, secondary to septic shock  SBO (small bowel obstruction):  - Recent self DC'd NG tube on 12/8  HTN (hypertension):  - reasonable inpatient control  Aspiration pneumonia:  - CXR 12/7 ; see below for results  Acute respiratory failure with hypoxia:  - Resolved, secondary to aspiration pneumonia  Left lower quadrant abscess:  - Continue to observe as per surgery, see above for antibiotic coverage  - New CT abdomen/pelvis with contrast, and pancultured has not shown clear cause for the leukocytosis  Pneumoperitoneum:  - Looks to be self improved  Thrombocytosis  - Reactive vs infection   Contact information; sister Corinne Ports [phone](HQION)   Code Status: Full code  Family Communication: Medical staff attempted to contact group home Disposition Plan: D/C today   Consultants:  Critical Care  General surgery  ID Procedures:  Urine culture 12/22/2012; negative (final)  Blood culture 12/7; NGTD  CT abdomen/pelvis with contrast 12/22/2012 Interval decrease in pneumoperitoneum although it does persist. The small anterior left upper pelvic mesenteric abscess seen on the previous study has resolved with no visible fluid collection at this location today. No other abscess evident. Persistent irregular wall thickening in the distal sigmoid colon and rectum with perirectal edema/inflammation. Scattered tiny  perirectal lymph nodes are stable and the dominant 1.4 x 2.2 cm left pararectal nodule is not substantially changed. Imaging features could be related to the infection, inflammation or neoplasm.  CXR 12/22/2012  Streaky bibasilar atelectasis. No definite infiltrates or effusions. Pneumoperitoneum again demonstrated.  Intubation the patient 11/22-12/1  Placement of left IJ central line 11/22  Right wrist x-ray 12/18/2012 No acute bony abnormality of the right wrist is demonstrated on this limited two view series  Antibiotics:  IV Flagyl 11/20 >> 12/4  Zosyn 11/20 >> 12/4  Enteral Vancomycin 11/20 >> 12/3  IV Vancomycin 11/24 >> 12/01  Micafungin 11/26 >> 12/4  Flagyl po 12/4 >> stopped 12/5  Metronidazole IV 12/5>>> 12/15 Rocephin IV 12/4 >> stopped 12/8    Discharge Exam: Filed Vitals:   12/27/12 0459  BP: 126/71  Pulse: 104  Temp: 97.7 F (36.5 C)  Resp: 20   Filed Vitals:   12/26/12 1338 12/26/12 2205 12/27/12 0459 12/27/12 0700  BP: 119/54 141/69 126/71   Pulse: 92 100 104   Temp: 98.1 F (36.7 C) 98.3 F (36.8 C) 97.7 F (36.5 C)   TempSrc: Axillary Axillary Axillary   Resp: 18 20 20    Height:      Weight:    76.9 kg (169 lb 8.5 oz)  SpO2: 96% 100% 100%     General: Pt is non verbal and not following any commands Cardiovascular: Regular rate and rhythm, S1/S2 +, no murmurs, no rubs, no gallops Respiratory: Clear to auscultation bilaterally, no wheezing, diminished breath sounds at bases  Abdominal: Soft, non tender, non distended, bowel sounds +, no guarding Extremities: no edema, no cyanosis, pulses palpable bilaterally DP and PT  Discharge Instructions  Discharge Orders   Future Orders Complete By Expires   Diet - low sodium heart healthy  As directed    Increase activity slowly  As directed    TNA per pharmacy consult  As directed    Scheduling Instructions:     Per pharmacy       Medication List    STOP taking these medications       meloxicam 7.5 MG tablet  Commonly known as:  MOBIC      TAKE these medications       albuterol (5 MG/ML) 0.5% nebulizer solution  Commonly known as:  PROVENTIL  Take 0.5 mLs (2.5 mg total) by nebulization every 2 (two)  hours as needed for wheezing or shortness of breath.     amLODipine 10 MG tablet  Commonly known as:  NORVASC  Take 1 tablet by mouth daily.     benazepril 20 MG tablet  Commonly known as:  LOTENSIN  Take 1 tablet by mouth daily.     feeding supplement (ENSURE) Pudg  Take 1 Container by mouth daily.     furosemide 20 MG tablet  Commonly known as:  LASIX  Take 1 tablet by mouth daily.     gabapentin 300 MG capsule  Commonly known as:  NEURONTIN  Take 1 capsule by mouth 3 (three) times daily.     hyoscyamine 0.375 MG 12 hr tablet  Commonly known as:  LEVBID  Take 0.375 mg by mouth 2 (two) times daily.     loratadine 10 MG tablet  Commonly known as:  CLARITIN  Take 10 mg by mouth daily.     metroNIDAZOLE 5-0.79 MG/ML-% IVPB  Commonly known as:  FLAGYL  Inject 100 mLs (500 mg total) into the vein every 8 (eight) hours.  PROBIOTIC FORMULA PO  Take 1 capsule by mouth daily.     risperiDONE 1 MG tablet  Commonly known as:  RISPERDAL  Take 1 tablet by mouth daily.           Follow-up Information   Follow up with Debbora Presto, MD.   Specialty:  Internal Medicine   Contact information:   [address] [place] [address] [phone]       Please follow up. (provider at the facility )        The results of significant diagnostics from this hospitalization (including imaging, microbiology, ancillary and laboratory) are listed below for reference.     Microbiology: Recent Results (from the past 240 hour(s))  CULTURE, BLOOD (ROUTINE X 2)     Status: None   Collection Time    12/22/12  3:40 PM      Result Value Range Status   Specimen Description BLOOD LEFT HAND   Final   Special Requests BOTTLES DRAWN AEROBIC ONLY 1CC   Final   Culture  Setup Time     Final   Value: 12/22/2012 20:02     Performed at [facility]   Culture     Final   Value:        BLOOD CULTURE RECEIVED NO GROWTH TO DATE CULTURE WILL BE HELD FOR 5 DAYS BEFORE ISSUING  A FINAL NEGATIVE REPORT     Performed at [facility]   Report Status PENDING   Incomplete  CULTURE, BLOOD (ROUTINE X 2)     Status: None   Collection Time    12/22/12  5:15 PM      Result Value Range Status   Specimen Description BLOOD RIGHT FOOT   Final   Special Requests BOTTLES DRAWN AEROBIC AND ANAEROBIC [facility] EACH   Final   Culture  Setup Time     Final   Value: 12/22/2012 20:02     Performed at [facility]   Culture     Final   Value:        BLOOD CULTURE RECEIVED NO GROWTH TO DATE CULTURE WILL BE HELD FOR 5 DAYS BEFORE ISSUING A FINAL NEGATIVE REPORT     Performed at [facility]   Report Status PENDING   Incomplete  URINE CULTURE     Status: None   Collection Time    12/22/12  6:37 PM      Result Value Range Status   Specimen Description URINE, CLEAN CATCH   Final   Special Requests Normal   Final   Culture  Setup Time     Final   Value: 12/23/2012 00:58     Performed at [facility] Count     Final   Value: NO GROWTH     Performed at [facility]   Culture     Final   Value: NO GROWTH     Performed at [facility]   Report Status 12/24/2012 FINAL   Final  STOOL CULTURE     Status: None   Collection Time    12/24/12 11:09 AM      Result Value Range Status   Specimen Description STOOL   Final   Special Requests NONE   Final   Culture     Final   Value: NO SUSPICIOUS COLONIES, CONTINUING TO HOLD     Performed at [facility]   Report Status PENDING   Incomplete  Labs: Basic Metabolic Panel:  Recent Labs Lab 12/23/12 0414 12/24/12 0345 12/25/12 0605 12/26/12 0747 12/27/12 0426  NA 136 137 136 134* 137  K 4.5 4.2 4.3 4.8 4.2  CL 103 104 105 105 106  CO2 23 21 22 21 23   GLUCOSE 122* 141* 128* 118* 117*  BUN 32* 31* 32* 29* 28*  CREATININE 0.88 0.88 0.81 0.76 0.77  CALCIUM 8.2* 8.0* 8.1* 7.9* 7.9*  MG 2.2 2.2 2.1 2.1 2.1  PHOS 4.1  --   --  4.0  --    Liver Function Tests:  Recent  Labs Lab 12/23/12 0414 12/24/12 0345 12/25/12 0605 12/26/12 0747 12/27/12 0426  AST 17 15 18 23 22   ALT 9 9 10 12 13   ALKPHOS 90 94 97 104 107  BILITOT 0.4 0.4 0.4 0.3 0.3  PROT 6.9 6.8 6.6 6.9 6.8  ALBUMIN 2.1* 2.1* 1.9* 2.0* 2.0*   CBC:  Recent Labs Lab 12/23/12 0414 12/24/12 0345 12/25/12 0605 12/26/12 0747 12/27/12 0426  WBC 15.4* 14.4* 12.7* 11.8* 14.2*  NEUTROABS 11.2* 11.4* 9.8* 8.6* 11.1*  HGB 11.9* 11.6* 11.3* 12.9* 11.5*  HCT 35.5* 34.6* 33.8* 38.4* 34.6*  MCV 86.0 85.4 85.8 85.5 85.4  PLT 681* 687* 537* 454* 537*   BNP: BNP (last 3 results)  Recent Labs  12/07/12 0335 12/18/12 0300  PROBNP 680.8* 53.8   CBG:  Recent Labs Lab 12/25/12 2242 12/26/12 0651 12/26/12 1337 12/26/12 2204 12/27/12 0624  GLUCAP 122* 100* 106* 105* 107*     SIGNED: Time coordinating discharge: Over 30 minutes  Debbora Presto, MD  [facility] 12/27/2012, 12:39 PM Pager [phone]  If 7PM-7AM, please contact night-coverage [email or web address] Password [email or web address]

## 2012-12-27 NOTE — Plan of Care (Signed)
Problem: Discharge Progression Outcomes Goal: Complications resolved/controlled Outcome: Adequate for Discharge F/U at [facility]

## 2012-12-27 NOTE — Progress Notes (Signed)
SLP Cancellation Note  Patient Details Name: Victor Little MRN: [id number] DOB: 11/10/1945   Cancelled treatment:        Pt not available - preparing for DC; currently with RN, who reports pt is not accepting anything po, and will not allow you to even get close to his mouth.  Pt is receiving TF.  DC pending today.    Bobbye Reinitz B. [place], [facility], CCC-SLP [phone]  Leigh Aurora 12/27/2012, 2:56 PM

## 2012-12-27 NOTE — Progress Notes (Signed)
Patient is set to discharge to [facility] today. Patient's guardian/sister, Charna Elizabeth, group home director both aware. Discharge packet given to RN, Ify. [facility] called for transport.   Clinical Social Work Department CLINICAL SOCIAL WORK PLACEMENT NOTE 12/27/2012  Patient:  DAIMIAN, SUDBERRY  Account Number:  [id number] Admit date:  12/05/2012  Clinical Social Worker:  Orpah Greek  Date/time:  12/19/2012 10:54 AM  Clinical Social Work is seeking post-discharge placement for this patient at the following level of care:   SKILLED NURSING   (*CSW will update this form in Epic as items are completed)   12/19/2012  Patient/family provided with [facility] Department of Clinical Social Work's list of facilities offering this level of care within the geographic area requested by the patient (or if unable, by the patient's family).  12/19/2012  Patient/family informed of their freedom to choose among providers that offer the needed level of care, that participate in Medicare, Medicaid or managed care program needed by the patient, have an available bed and are willing to accept the patient.  12/19/2012  Patient/family informed of [facility]' ownership interest in [facility], as well as of the fact that they are under no obligation to receive care at this facility.  PASARR submitted to EDS on 12/19/2012 PASARR number received from EDS on 12/24/2012  FL2 transmitted to all facilities in geographic area requested by pt/family on  12/19/2012 FL2 transmitted to all facilities within larger geographic area on   Patient informed that his/her managed care company has contracts with or will negotiate with  certain facilities, including the following:     Patient/family informed of bed offers received:  12/26/2012 Patient chooses bed at [facility] Physician recommends and patient chooses bed at    Patient to be transferred to [facility] on  12/27/2012 Patient to be transferred to facility by [facility]  The following physician request were entered in Epic:   Additional Comments:   Unice Bailey, LCSW [facility] Clinical Social Worker cell #: [phone]

## 2012-12-27 NOTE — Progress Notes (Deleted)
Clinical Social Work Department BRIEF PSYCHOSOCIAL ASSESSMENT 12/27/2012  Patient:  Victor Little     Account Number:  [id number]     Admit date:  12/16/2012  Clinical Social Worker:  Orpah Greek  Date/Time:  12/27/2012 11:17 AM  Referred by:  RN  Date Referred:  12/27/2012 Referred for  SNF Placement   Other Referral:   Interview type:  Patient Other interview type:    PSYCHOSOCIAL DATA Living Status:  ALONE Admitted from facility:   Level of care:   Primary support name:  Fara Boros (sister) ph#: [phone] Primary support relationship to patient:  SIBLING Degree of support available:   good    CURRENT CONCERNS Current Concerns  Post-Acute Placement   Other Concerns:    SOCIAL WORK ASSESSMENT / PLAN CSW reviewed PT evaluation recommending SNF placement.   Assessment/plan status:  Information/Referral to [facility] Other assessment/ plan:   Information/referral to community resources:   CSW completed FL2 and faxed information out to [facility] - will follow-up with bed offers when ready.    PATIENT'S/FAMILY'S RESPONSE TO PLAN OF CARE: Patient was hesitant about going to SNF but agreeable to have information faxed out to see which bed offers he receives. Patient states that he was completely indepdendent prior to coming into the hospital. CSW explained that SNF would be temporary to get him stronger so that he'd be more stable once he goes home.       Unice Bailey, LCSW [facility] Clinical Social Worker cell #: [phone]

## 2012-12-28 LAB — CULTURE, BLOOD (ROUTINE X 2): Culture: NO GROWTH

## 2012-12-28 LAB — STOOL CULTURE

## 2013-03-13 ENCOUNTER — Inpatient Hospital Stay (HOSPITAL_COMMUNITY): Payer: PRIVATE HEALTH INSURANCE

## 2013-03-13 ENCOUNTER — Inpatient Hospital Stay (HOSPITAL_COMMUNITY)
Admission: EM | Admit: 2013-03-13 | Discharge: 2013-03-20 | DRG: 375 | Disposition: A | Discharge status: Skilled Nursing Facility | Payer: PRIVATE HEALTH INSURANCE | Attending: Internal Medicine | Admitting: Internal Medicine

## 2013-03-13 ENCOUNTER — Encounter (HOSPITAL_COMMUNITY): Payer: Self-pay | Admitting: Emergency Medicine

## 2013-03-13 DIAGNOSIS — H9190 Unspecified hearing loss, unspecified ear: Secondary | ICD-10-CM | POA: Diagnosis present

## 2013-03-13 DIAGNOSIS — D75838 Other thrombocytosis: Secondary | ICD-10-CM

## 2013-03-13 DIAGNOSIS — C787 Secondary malignant neoplasm of liver and intrahepatic bile duct: Secondary | ICD-10-CM | POA: Diagnosis present

## 2013-03-13 DIAGNOSIS — J189 Pneumonia, unspecified organism: Secondary | ICD-10-CM

## 2013-03-13 DIAGNOSIS — R160 Hepatomegaly, not elsewhere classified: Secondary | ICD-10-CM | POA: Diagnosis present

## 2013-03-13 DIAGNOSIS — N179 Acute kidney failure, unspecified: Secondary | ICD-10-CM

## 2013-03-13 DIAGNOSIS — R509 Fever, unspecified: Secondary | ICD-10-CM

## 2013-03-13 DIAGNOSIS — K921 Melena: Secondary | ICD-10-CM | POA: Diagnosis present

## 2013-03-13 DIAGNOSIS — K6289 Other specified diseases of anus and rectum: Secondary | ICD-10-CM

## 2013-03-13 DIAGNOSIS — C20 Malignant neoplasm of rectum: Principal | ICD-10-CM | POA: Diagnosis present

## 2013-03-13 DIAGNOSIS — M79601 Pain in right arm: Secondary | ICD-10-CM

## 2013-03-13 DIAGNOSIS — R7989 Other specified abnormal findings of blood chemistry: Secondary | ICD-10-CM

## 2013-03-13 DIAGNOSIS — G589 Mononeuropathy, unspecified: Secondary | ICD-10-CM | POA: Diagnosis present

## 2013-03-13 DIAGNOSIS — D509 Iron deficiency anemia, unspecified: Secondary | ICD-10-CM

## 2013-03-13 DIAGNOSIS — K612 Anorectal abscess: Secondary | ICD-10-CM | POA: Diagnosis present

## 2013-03-13 DIAGNOSIS — I10 Essential (primary) hypertension: Secondary | ICD-10-CM | POA: Diagnosis present

## 2013-03-13 DIAGNOSIS — R4701 Aphasia: Secondary | ICD-10-CM

## 2013-03-13 DIAGNOSIS — F079 Unspecified personality and behavioral disorder due to known physiological condition: Secondary | ICD-10-CM | POA: Diagnosis present

## 2013-03-13 DIAGNOSIS — A0472 Enterocolitis due to Clostridium difficile, not specified as recurrent: Secondary | ICD-10-CM

## 2013-03-13 DIAGNOSIS — G629 Polyneuropathy, unspecified: Secondary | ICD-10-CM

## 2013-03-13 DIAGNOSIS — J9601 Acute respiratory failure with hypoxia: Secondary | ICD-10-CM

## 2013-03-13 DIAGNOSIS — H913 Deaf nonspeaking, not elsewhere classified: Secondary | ICD-10-CM | POA: Diagnosis present

## 2013-03-13 DIAGNOSIS — F79 Unspecified intellectual disabilities: Secondary | ICD-10-CM | POA: Diagnosis present

## 2013-03-13 DIAGNOSIS — K922 Gastrointestinal hemorrhage, unspecified: Secondary | ICD-10-CM | POA: Diagnosis present

## 2013-03-13 DIAGNOSIS — F09 Unspecified mental disorder due to known physiological condition: Secondary | ICD-10-CM | POA: Diagnosis present

## 2013-03-13 HISTORY — DX: Polyneuropathy, unspecified: G62.9

## 2013-03-13 HISTORY — DX: Cellulitis, unspecified: L03.90

## 2013-03-13 HISTORY — DX: Pneumonia, unspecified organism: J18.9

## 2013-03-13 HISTORY — DX: Other specified disorders of peritoneum: K66.8

## 2013-03-13 HISTORY — DX: Enterocolitis due to Clostridium difficile, not specified as recurrent: A04.72

## 2013-03-13 HISTORY — DX: Peritoneal abscess: K65.1

## 2013-03-13 HISTORY — DX: Aphasia: R47.01

## 2013-03-13 LAB — TYPE AND SCREEN
ABO/RH(D): B POS
ANTIBODY SCREEN: NEGATIVE

## 2013-03-13 LAB — BASIC METABOLIC PANEL
BUN: 13 mg/dL (ref 6–23)
CO2: 25 meq/L (ref 19–32)
Calcium: 8.7 mg/dL (ref 8.4–10.5)
Chloride: 103 mEq/L (ref 96–112)
Creatinine, Ser: 1 mg/dL (ref 0.50–1.35)
GFR calc Af Amer: 89 mL/min — ABNORMAL LOW (ref >90)
GFR calc non Af Amer: 76 mL/min — ABNORMAL LOW (ref >90)
GLUCOSE: 94 mg/dL (ref 70–99)
POTASSIUM: 3.9 meq/L (ref 3.7–5.3)
SODIUM: 143 meq/L (ref 137–147)

## 2013-03-13 LAB — CBC WITH DIFFERENTIAL/PLATELET
Basophils Absolute: 0.1 10*3/uL (ref 0.0–0.1)
Basophils Relative: 1 % (ref 0–1)
Eosinophils Absolute: 0.4 10*3/uL (ref 0.0–0.7)
Eosinophils Relative: 5 % (ref 0–5)
HCT: 37.4 % — ABNORMAL LOW (ref 39.0–52.0)
HEMOGLOBIN: 12.1 g/dL — AB (ref 13.0–17.0)
LYMPHS PCT: 25 % (ref 12–46)
Lymphs Abs: 2 10*3/uL (ref 0.7–4.0)
MCH: 27.4 pg (ref 26.0–34.0)
MCHC: 32.4 g/dL (ref 30.0–36.0)
MCV: 84.8 fL (ref 78.0–100.0)
MONOS PCT: 7 % (ref 3–12)
Monocytes Absolute: 0.6 10*3/uL (ref 0.1–1.0)
NEUTROS PCT: 62 % (ref 43–77)
Neutro Abs: 4.9 10*3/uL (ref 1.7–7.7)
PLATELETS: 329 10*3/uL (ref 150–400)
RBC: 4.41 MIL/uL (ref 4.22–5.81)
RDW: 14.6 % (ref 11.5–15.5)
WBC: 7.8 10*3/uL (ref 4.0–10.5)

## 2013-03-13 LAB — HEMOGLOBIN: Hemoglobin: 11.3 g/dL — ABNORMAL LOW (ref 13.0–17.0)

## 2013-03-13 LAB — POC OCCULT BLOOD, ED: Fecal Occult Bld: POSITIVE — AB

## 2013-03-13 LAB — ABO/RH: ABO/RH(D): B POS

## 2013-03-13 MED ORDER — MIRTAZAPINE 7.5 MG PO TABS
7.5000 mg | ORAL_TABLET | Freq: Every day | ORAL | Status: DC
  Administered 2013-03-13 – 2013-03-19 (×7): 7.5 mg via ORAL
  Filled 2013-03-13 (×9): qty 1

## 2013-03-13 MED ORDER — ADULT MULTIVITAMIN W/MINERALS CH
1.0000 | ORAL_TABLET | Freq: Every day | ORAL | Status: DC
  Administered 2013-03-14 – 2013-03-20 (×7): 1 via ORAL
  Filled 2013-03-13 (×7): qty 1

## 2013-03-13 MED ORDER — SACCHAROMYCES BOULARDII 250 MG PO CAPS
250.0000 mg | ORAL_CAPSULE | Freq: Two times a day (BID) | ORAL | Status: DC
  Administered 2013-03-13 – 2013-03-20 (×14): 250 mg via ORAL
  Filled 2013-03-13 (×15): qty 1

## 2013-03-13 MED ORDER — SODIUM CHLORIDE 0.9 % IV SOLN
INTRAVENOUS | Status: DC
  Administered 2013-03-13 – 2013-03-15 (×4): via INTRAVENOUS

## 2013-03-13 MED ORDER — ALBUTEROL SULFATE (2.5 MG/3ML) 0.083% IN NEBU: 2.5000 mg | INHALATION_SOLUTION | RESPIRATORY_TRACT | Status: DC | PRN

## 2013-03-13 MED ORDER — PANTOPRAZOLE SODIUM 40 MG IV SOLR
40.0000 mg | INTRAVENOUS | Status: DC
  Administered 2013-03-13 – 2013-03-17 (×5): 40 mg via INTRAVENOUS
  Filled 2013-03-13 (×6): qty 40

## 2013-03-13 MED ORDER — ACETAMINOPHEN 650 MG RE SUPP: 650.0000 mg | Freq: Four times a day (QID) | RECTAL | Status: DC | PRN

## 2013-03-13 MED ORDER — METOPROLOL TARTRATE 25 MG PO TABS
25.0000 mg | ORAL_TABLET | Freq: Every day | ORAL | Status: DC
  Administered 2013-03-13 – 2013-03-20 (×8): 25 mg via ORAL
  Filled 2013-03-13 (×8): qty 1

## 2013-03-13 MED ORDER — ONDANSETRON HCL 4 MG/2ML IJ SOLN: 4.0000 mg | Freq: Four times a day (QID) | INTRAMUSCULAR | Status: DC | PRN

## 2013-03-13 MED ORDER — TRAMADOL HCL 50 MG PO TABS
50.0000 mg | ORAL_TABLET | Freq: Four times a day (QID) | ORAL | Status: DC | PRN
  Administered 2013-03-15 – 2013-03-16 (×2): 50 mg via ORAL
  Filled 2013-03-13 (×2): qty 1

## 2013-03-13 MED ORDER — IOHEXOL 300 MG/ML  SOLN
50.0000 mL | Freq: Once | INTRAMUSCULAR | Status: AC | PRN
  Administered 2013-03-13: 50 mL via ORAL

## 2013-03-13 MED ORDER — LORATADINE 10 MG PO TABS
10.0000 mg | ORAL_TABLET | Freq: Every day | ORAL | Status: DC
  Administered 2013-03-14 – 2013-03-20 (×7): 10 mg via ORAL
  Filled 2013-03-13 (×7): qty 1

## 2013-03-13 MED ORDER — GABAPENTIN 300 MG PO CAPS
300.0000 mg | ORAL_CAPSULE | Freq: Three times a day (TID) | ORAL | Status: DC
  Administered 2013-03-13 – 2013-03-20 (×20): 300 mg via ORAL
  Filled 2013-03-13 (×22): qty 1

## 2013-03-13 MED ORDER — ONDANSETRON HCL 4 MG PO TABS: 4.0000 mg | ORAL_TABLET | Freq: Four times a day (QID) | ORAL | Status: DC | PRN

## 2013-03-13 MED ORDER — IOHEXOL 300 MG/ML  SOLN
100.0000 mL | Freq: Once | INTRAMUSCULAR | Status: AC | PRN
  Administered 2013-03-13: 100 mL via INTRAVENOUS

## 2013-03-13 MED ORDER — ACETAMINOPHEN 325 MG PO TABS: 650.0000 mg | ORAL_TABLET | Freq: Four times a day (QID) | ORAL | Status: DC | PRN

## 2013-03-13 NOTE — H&P (Addendum)
History and Physical  Victor Little QMG:[id number] DOB: 06/22/1945 DOA: 03/13/2013  Referring physician: Dr. Jola Schmidt PCP: [name], MD  Chief Complaint: Bloody stools   History of Present Illness: Victor Little is an 68 y.o. male with a PMH of organic brain syndrome with deafness/muteness, prior SBO, prior pneumoperitoneum 12/14, clostridium difficile colitis, not on chronic ASA, who was sent from [facility] with a 24 hour history of bloody stools.  He told the ER doctor that he has had bloody bowel movements before and that he had a colonoscopy in the past, but was unable to give the name of the physician or the year. He denies abdominal pain. No nausea or vomiting. He's on no other anticoagulants. Upon initial evaluation in the ED, the patient's blood pressure was stable, his hemoglobin was stable at 12.1, and a rectal exam revealed grossly bloody stool.  Review of Systems: Unable to obtain secondary to patient being deaf/mute.  Past Medical History Past Medical History  Diagnosis Date  . Mental retardation   . Organic brain syndrome   . Hypertension   . Deaf   . Clostridium difficile colitis   . Pneumonia   . Cellulitis   . Deaf   . Mutism   . Neuropathy      Past Surgical History Past Surgical History  Procedure Laterality Date  . Orif right patella  09/23/2005  . Removal of right patella hardware  01/26/2006     Social History: History   Social History  . Marital Status: Single    Spouse Name: N/A    Number of Children: N/A  . Years of Education: N/A   Occupational History  . Not on file.   Social History Main Topics  . Smoking status: Never Smoker   . Smokeless tobacco: Never Used  . Alcohol Use: No  . Drug Use: No  . Sexual Activity: No   Other Topics Concern  . Not on file   Social History Narrative   SNF resident.    Family History:  History reviewed. No pertinent family history.  FH not known, no records  available.  Allergies: Review of patient's allergies indicates no known allergies.  Meds: Prior to Admission medications   Medication Sig Start Date End Date Taking? Authorizing Provider  albuterol (PROVENTIL) (5 MG/ML) 0.5% nebulizer solution Take 0.5 mLs (2.5 mg total) by nebulization every 2 (two) hours as needed for wheezing or shortness of breath. 12/27/12  Yes Theodis Blaze, MD  benazepril (LOTENSIN) 5 MG tablet Take 5 mg by mouth daily.   Yes Historical Provider, MD  feeding supplement, ENSURE, (ENSURE) PUDG Take 1 Container by mouth daily. 12/27/12  Yes Theodis Blaze, MD  ferrous sulfate 325 (65 FE) MG tablet Take 325 mg by mouth daily with breakfast.   Yes Historical Provider, MD  furosemide (LASIX) 20 MG tablet Take 20 mg by mouth daily.   Yes Historical Provider, MD  gabapentin (NEURONTIN) 300 MG capsule Take 1 capsule by mouth 3 (three) times daily. 11/16/12  Yes Historical Provider, MD  loratadine (CLARITIN) 10 MG tablet Take 10 mg by mouth daily.   Yes Historical Provider, MD  metoprolol tartrate (LOPRESSOR) 25 MG tablet Take 25 mg by mouth daily.   Yes Historical Provider, MD  mirtazapine (REMERON) 7.5 MG tablet Take 7.5 mg by mouth at bedtime.   Yes Historical Provider, MD  Multiple Vitamin (MULTIVITAMIN WITH MINERALS) TABS tablet Take 1 tablet by mouth daily.   Yes  Historical Provider, MD  PRESCRIPTION MEDICATION Take 120 mLs by mouth 3 (three) times daily. Med Plus   Yes Historical Provider, MD  Probiotic Product (PROBIOTIC FORMULA PO) Take 1 capsule by mouth daily.   Yes Historical Provider, MD  traMADol (ULTRAM) 50 MG tablet Take 50 mg by mouth every 6 (six) hours as needed.   Yes Historical Provider, MD    Physical Exam: Filed Vitals:   03/13/13 1126 03/13/13 1136 03/13/13 1410  BP:   109/69  Pulse:  96 94  Temp:  97.8 F (36.6 C)   TempSrc:  Oral   Resp:  20 18  SpO2: 99% 100% 100%     Physical Exam: Blood pressure 109/69, pulse 94, temperature 97.8 F (36.6  C), temperature source Oral, resp. rate 18, SpO2 100.00%. Gen: No acute distress. Head: Normocephalic, atraumatic. Eyes: PERRL, EOMI, sclerae nonicteric. Mouth: Oropharynx clear with dry mucous membranes and fair dentition. Neck: Supple, no thyromegaly, no lymphadenopathy, no jugular venous distention. Chest: Lungs are clear to auscultation bilaterally, diminished at the bases. CV: Heart sounds are regular. No murmurs, rubs, or gallops. Abdomen: Soft, nontender, nondistended with normal active bowel sounds. Rectal exam done by EDP which was + for gross blood, no external hemorrhoids. Extremities: Extremities without clubbing, edema, or cyanosis. Skin: Warm and dry. Neuro: Alert; cranial nerves II through XII grossly intact. Psych: Mood and affect normal.  Labs on Admission:  Basic Metabolic Panel:  Recent Labs Lab 03/13/13 1200  NA 143  K 3.9  CL 103  CO2 25  GLUCOSE 94  BUN 13  CREATININE 1.00  CALCIUM 8.7   CBC:  Recent Labs Lab 03/13/13 1200  WBC 7.8  NEUTROABS 4.9  HGB 12.1*  HCT 37.4*  MCV 84.8  PLT 329    BNP (last 3 results)  Recent Labs  12/07/12 0335 12/18/12 0300  PROBNP 680.8* 53.8   CBG: No results found for this basename: GLUCAP,  in the last 168 hours  Radiological Exams on Admission: No results found.  Assessment/Plan Principal Problem:   Lower GI bleed Given history of prior CT scans that did not demonstrate diverticulosis, prior history of pneumoperitoneum as well as Clostridium difficile colitis, will obtain CT scan of the abdomen and pelvis. Place on bowel rest with clear liquids. Supportive care with IV fluids, and anti-emetics as needed.  Check serial hemoglobins every 6 hours x24 hours. 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  OT consult.   HTN (hypertension) Hold antihypertensives except for metoprolol for now.   Neuropathy Continue Neurontin.   DVT  prophylaxis SCDs.  Code Status: Full. Family Communication: No family at the bedside.  Disposition Plan: SNF.  Time spent: 1 hour.  Victor Little [facility] Pager [phone] Cell: [phone]   If 7PM-7AM, please contact night-coverage [email or web address] Password [email or web address] 03/13/2013, 4:06 PM    **Disclaimer: This note was dictated with voice recognition software. Similar sounding words can inadvertently be transcribed and this note may contain transcription errors which may not have been corrected upon publication of note.**

## 2013-03-13 NOTE — ED Notes (Signed)
Pt in CT. Will transport pt after CT.

## 2013-03-13 NOTE — ED Notes (Signed)
Bed: WA02 Expected date:  Expected time:  Means of arrival:  Comments: EMS-blood in stool

## 2013-03-13 NOTE — ED Provider Notes (Addendum)
CSN: [id number]     Arrival date & time 03/13/13  1126 History   First MD Initiated Contact with Patient 03/13/13 1148     Chief Complaint  Patient presents with  . Rectal Bleeding    Level V caveat: Communication issues (deaf/mute)  HPI Majority of history was obtained writing back-and-forth with the patient on a sheet of paper  Patient presents today from [facility] health care with complaints of bloody stool x1 day.  He reports his had bloody bowel movements x5 with blood mixed with the stool.  He states he is on aspirin.  He states he has had bloody bowel movements before.  He reports a colonoscopy but is unable to give me the physician or the year.  He denies abdominal pain.  No nausea or vomiting.  He's on no other anticoagulants.   Past Medical History  Diagnosis Date  . Mental retardation   . Organic brain syndrome   . Hypertension   . Deaf    Past Surgical History  Procedure Laterality Date  . Orif right patella  09/23/2005  . Removal of right patella hardware  01/26/2006   No family history on file. History  Substance Use Topics  . Smoking status: Never Smoker   . Smokeless tobacco: Never Used  . Alcohol Use: No    Review of Systems  Unable to perform ROS     Allergies  Review of patient's allergies indicates no known allergies.  Home Medications   Current Outpatient Rx  Name  Route  Sig  Dispense  Refill  . albuterol (PROVENTIL) (5 MG/ML) 0.5% nebulizer solution   Nebulization   Take 0.5 mLs (2.5 mg total) by nebulization every 2 (two) hours as needed for wheezing or shortness of breath.   20 mL   12   . benazepril (LOTENSIN) 5 MG tablet   Oral   Take 5 mg by mouth daily.         . feeding supplement, ENSURE, (ENSURE) PUDG   Oral   Take 1 Container by mouth daily.   30 Container   11   . ferrous sulfate 325 (65 FE) MG tablet   Oral   Take 325 mg by mouth daily with breakfast.         . furosemide (LASIX) 20 MG tablet   Oral   Take 20 mg  by mouth daily.         Marland Kitchen gabapentin (NEURONTIN) 300 MG capsule   Oral   Take 1 capsule by mouth 3 (three) times daily.         Marland Kitchen loratadine (CLARITIN) 10 MG tablet   Oral   Take 10 mg by mouth daily.         . metoprolol tartrate (LOPRESSOR) 25 MG tablet   Oral   Take 25 mg by mouth daily.         . mirtazapine (REMERON) 7.5 MG tablet   Oral   Take 7.5 mg by mouth at bedtime.         . Multiple Vitamin (MULTIVITAMIN WITH MINERALS) TABS tablet   Oral   Take 1 tablet by mouth daily.         Marland Kitchen PRESCRIPTION MEDICATION   Oral   Take 120 mLs by mouth 3 (three) times daily. Med Plus         . Probiotic Product (PROBIOTIC FORMULA PO)   Oral   Take 1 capsule by mouth daily.         Marland Kitchen  traMADol (ULTRAM) 50 MG tablet   Oral   Take 50 mg by mouth every 6 (six) hours as needed.          Pulse 96  Temp(Src) 97.8 F (36.6 C) (Oral)  Resp 20  SpO2 100% Physical Exam  Nursing note and vitals reviewed. Constitutional: He appears well-developed and well-nourished.  HENT:  Head: Normocephalic and atraumatic.  Eyes: EOM are normal.  Neck: Normal range of motion.  Cardiovascular: Normal rate, regular rhythm, normal heart sounds and intact distal pulses.   Pulmonary/Chest: Effort normal and breath sounds normal. No respiratory distress.  Abdominal: Soft. He exhibits no distension. There is no tenderness.  Genitourinary:  Gross red blood on rectal exam.  No bleeding external hemorrhoids.  Musculoskeletal: Normal range of motion.  Neurological: He is alert.  Skin: Skin is warm and dry.  Psychiatric: He has a normal mood and affect. Judgment normal.    ED Course  Procedures (including critical care time) Labs Review Labs Reviewed  CBC WITH DIFFERENTIAL - Abnormal; Notable for the following:    Hemoglobin 12.1 (*)    HCT 37.4 (*)    All other components within normal limits  BASIC METABOLIC PANEL - Abnormal; Notable for the following:    GFR calc non Af Amer  76 (*)    GFR calc Af Amer 89 (*)    All other components within normal limits  POC OCCULT BLOOD, ED  TYPE AND SCREEN  ABO/RH   Imaging Review No results found.  EKG Interpretation   None       MDM   Final diagnoses:  Lower GI bleed    Hemoglobin stable at 12.1.  Patient will benefit from admission for serial CBCs.  No abdominal tenderness at this time.  12:44 PM Pulse 97 BP 109/73  Hoy Morn, MD 03/13/13 [place], MD 03/13/13 [phone]

## 2013-03-13 NOTE — ED Notes (Signed)
Pt from [facility] with c/o blood in stool x1 day.  Pt is deaf/mute but able to make needs known.  Pt denies pain.  Skin PWD. Ambulatory per EMS.

## 2013-03-14 ENCOUNTER — Encounter (HOSPITAL_COMMUNITY): Payer: Self-pay | Admitting: General Surgery

## 2013-03-14 DIAGNOSIS — R160 Hepatomegaly, not elsewhere classified: Secondary | ICD-10-CM | POA: Diagnosis present

## 2013-03-14 DIAGNOSIS — C787 Secondary malignant neoplasm of liver and intrahepatic bile duct: Secondary | ICD-10-CM

## 2013-03-14 DIAGNOSIS — D490 Neoplasm of unspecified behavior of digestive system: Secondary | ICD-10-CM

## 2013-03-14 DIAGNOSIS — C20 Malignant neoplasm of rectum: Secondary | ICD-10-CM | POA: Diagnosis present

## 2013-03-14 DIAGNOSIS — F079 Unspecified personality and behavioral disorder due to known physiological condition: Secondary | ICD-10-CM

## 2013-03-14 DIAGNOSIS — M79609 Pain in unspecified limb: Secondary | ICD-10-CM

## 2013-03-14 DIAGNOSIS — K922 Gastrointestinal hemorrhage, unspecified: Secondary | ICD-10-CM

## 2013-03-14 DIAGNOSIS — K921 Melena: Secondary | ICD-10-CM

## 2013-03-14 LAB — HEMOGLOBIN
HEMOGLOBIN: 10.8 g/dL — AB (ref 13.0–17.0)
Hemoglobin: 10.9 g/dL — ABNORMAL LOW (ref 13.0–17.0)

## 2013-03-14 LAB — HEMOGLOBIN AND HEMATOCRIT, BLOOD
HCT: 35.6 % — ABNORMAL LOW (ref 39.0–52.0)
HEMOGLOBIN: 11.5 g/dL — AB (ref 13.0–17.0)

## 2013-03-14 MED ORDER — PEG 3350-KCL-NA BICARB-NACL 420 G PO SOLR
4000.0000 mL | Freq: Once | ORAL | Status: AC
  Administered 2013-03-14: 4000 mL via ORAL

## 2013-03-14 NOTE — Progress Notes (Signed)
IR received request for image guided liver abscess drain placement, images reviewed by Dr. Pascal Lux who feels this is not amendable to a percutaneous drain at this time and would recommend routine follow up imaging. Dr. Pascal Lux has discussed this with Dr. Sheran Fava today over the phone.   Tsosie Billing PA-C Interventional Radiology  03/14/13  11:13 AM

## 2013-03-14 NOTE — Progress Notes (Signed)
OT Cancellation Note  Patient Details Name: Victor Little MRN: [id number] DOB: [birth year]   Cancelled Treatment:     Reviewing order as Rehab Supervisor and signed off OT order due to communication more in scope of services for an SLP order.  I spoke with pt's nurse and simple communication boards were given to nursing to try with pt. However, please feel free to order SLP if you need further assessment for pt's communication needs, we would be glad to help, thanks.   Clide Dales 03/14/2013, 4:16 PM Clide Dales, PT (Acute Rehab Supervisor)  Pager: [phone] 03/14/2013

## 2013-03-14 NOTE — Consult Note (Signed)
ATTENDING ADDENDUM:  I personally reviewed patient's record, examined the patient, and formulated the following assessment and plan:  Pt with palpable rectal mass and rectal bleeding.  GI to perform colonoscopy and evaluate.  Concerning for rectal cancer.

## 2013-03-14 NOTE — Progress Notes (Signed)
Clinical Social Work Department BRIEF PSYCHOSOCIAL ASSESSMENT 03/14/2013  Patient:  Victor Little, Victor Little     Account Number:  [id number]     Admit date:  03/13/2013  Clinical Social Worker:  Earlie Server  Date/Time:  03/14/2013 09:45 AM  Referred by:  Physician  Date Referred:  03/14/2013 Referred for  SNF Placement   Other Referral:   Interview type:  Patient Other interview type:    PSYCHOSOCIAL DATA Living Status:  FACILITY Admitted from facility:  [facility] Level of care:  [place] Primary support name:  Thayer Headings Primary support relationship to patient:  [place] Degree of support available:   Adequate    CURRENT CONCERNS Current Concerns  Post-Acute Placement   Other Concerns:    SOCIAL WORK ASSESSMENT / PLAN CSW received referral due to patient being admitted from a SNF. CSW reviewed chart which stated that patient is from [facility]. CSW attempted to meet with patient at bedside but patient is mute and deaf and unable to participate in assessment.    CSW called and spoke with emergency contacts listed in chart. CSW spoke with Joelene Millin who reports that she cared for patient when he was living in her group home but he has transitioned to SNF. Joelene Millin reports that patient's sister is the main contact person for decisions that need to be made. CSW called sister and left a message. CSW spoke with [facility] Glad) who reports that patient can be readmitted at [place] inquired about level Z pasarr and SNF reports they will review pasarr needs.    CSW completed FL2 and placed in chart. CSW will await to hear from sister to confirm DC plans.   Assessment/plan status:  Psychosocial Support/Ongoing Assessment of Needs Other assessment/ plan:   Information/referral to community resources:   Will return to SNF    PATIENT'S/FAMILY'S RESPONSE TO PLAN OF CARE: Patient unable to participate in assessment. Patient's sister did  not answer the phone but CSW will complete assessment with her. SNF agreeable to accept patient and thanked CSW for addressing pasarr concerns. CSW will continue to follow.       [place], [place] L. Garcia [phone]

## 2013-03-14 NOTE — Progress Notes (Signed)
Clinical Social Work  CSW spoke with Thayer Headings via phone who reports she is legal guardian over patient. CSW asked sister to bring in paperwork to verify this information. Sister agreeable for patient to return to [facility] at [place]. CSW will continue to follow.  [place], [place] [phone]

## 2013-03-14 NOTE — Consult Note (Addendum)
[name] for Infectious Disease  Total days of antibiotics 0               Reason for Consult: perirectal abscess/ bloody stool    Referring Physician: short  Principal Problem:   Lower GI bleed Active Problems:   Organic brain syndrome (chronic)   Deaf   HTN (hypertension)   Neuropathy   Mute    HPI: Victor Little is a 68 y.o. 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 He was hospitalized for nearly 3 wks in Fall 2014 for severe sepsis thought to be due to SBO/pneumoperitoneum/ c.difficile colitis/liver abscess/intra-abdominal abscess. He is readmitted on 2/26 for 1 day history of bloody stool. There is no mention to fevers, chills, nightsweats or diarrhea. On admit, his physical exam reveals bloody stool rectal vault, not tachycardic nor hypotensive. His labs do not show any leukocytosis or left shift. He did undergo an abd ct which showed a slight increased hypoattenuating lesion involving the left lobe of  the liver measuring 25 x 23 mm cross-section. Persistent left perirectal peripherally enhancing fluid collection  measuring 15 x 20 x 26 mm, likely residual perirectal abscess.   Past Medical History  Diagnosis Date  . Mental retardation   . Organic brain syndrome   . Hypertension   . Deaf   . Clostridium difficile colitis   . Pneumonia   . Cellulitis   . Deaf   . Mutism   . Neuropathy     Allergies: No Known Allergies  MEDICATIONS: . gabapentin  300 mg Oral TID  . loratadine  10 mg Oral Daily  . metoprolol tartrate  25 mg Oral Daily  . mirtazapine  7.5 mg Oral QHS  . multivitamin with minerals  1 tablet Oral Daily  . pantoprazole (PROTONIX) IV  40 mg Intravenous Q24H  . saccharomyces boulardii  250 mg Oral BID    History  Substance Use Topics  . Smoking status: Never Smoker   . Smokeless tobacco: Never Used  . Alcohol Use: No    History reviewed. No pertinent  family history.  Review of Systems  Done via interpreter: he reports having left thigh pain from injury in wheelchair. Mild abdominal pain in the LUQ. 10 point ROS is otherwise negative    OBJECTIVE: Temp:  [97.8 F (36.6 C)-98.1 F (36.7 C)] 97.8 F (36.6 C) (02/27 0515) Pulse Rate:  [68-96] 68 (02/27 0515) Resp:  [16-20] 18 (02/27 0515) BP: (109-132)/(69-80) 117/70 mmHg (02/27 0515) SpO2:  [93 %-100 %] 100 % (02/27 0515)  Constitutional: He is oriented to person, place. He appears chronically and well-nourished. No distress.  HENT: poor dentition. Oral mucosa is dry but no thrush. No oropharyngeal exudate.  Cardiovascular: Normal rate, regular rhythm and normal heart sounds. Exam reveals no gallop and no friction rub.  No murmur heard.  Pulmonary/Chest: Effort normal and breath sounds normal. No respiratory distress. He has no wheezes.  Abdominal: Soft. Bowel sounds are normal. He exhibits no distension. There is no tenderness.  Lymphadenopathy:  He has no cervical adenopathy.  Neurological: He is alert and oriented to person, place, and time.  Skin: Skin is warm and dry. No rash noted. No erythema. Ext: right thigh is without any abrasion or echymosis. He states there is mild tenderness in upper lateral thigh. No fluctuance. No rash Psychiatric: appears at his baseline   LABS: Results for orders placed during  the hospital encounter of 03/13/13 (from the past 48 hour(s))  CBC WITH DIFFERENTIAL     Status: Abnormal   Collection Time    03/13/13 12:00 PM      Result Value Ref Range   WBC 7.8  4.0 - 10.5 K/uL   RBC 4.41  4.22 - 5.81 MIL/uL   Hemoglobin 12.1 (*) 13.0 - 17.0 g/dL   HCT 37.4 (*) 39.0 - 52.0 %   MCV 84.8  78.0 - 100.0 fL   MCH 27.4  26.0 - 34.0 pg   MCHC 32.4  30.0 - 36.0 g/dL   RDW 14.6  11.5 - 15.5 %   Platelets 329  150 - 400 K/uL   Neutrophils Relative % 62  43 - 77 %   Neutro Abs 4.9  1.7 - 7.7 K/uL   Lymphocytes Relative 25  12 - 46 %   Lymphs Abs  2.0  0.7 - 4.0 K/uL   Monocytes Relative 7  3 - 12 %   Monocytes Absolute 0.6  0.1 - 1.0 K/uL   Eosinophils Relative 5  0 - 5 %   Eosinophils Absolute 0.4  0.0 - 0.7 K/uL   Basophils Relative 1  0 - 1 %   Basophils Absolute 0.1  0.0 - 0.1 K/uL  BASIC METABOLIC PANEL     Status: Abnormal   Collection Time    03/13/13 12:00 PM      Result Value Ref Range   Sodium 143  137 - 147 mEq/L   Potassium 3.9  3.7 - 5.3 mEq/L   Chloride 103  96 - 112 mEq/L   CO2 25  19 - 32 mEq/L   Glucose, Bld 94  70 - 99 mg/dL   BUN 13  6 - 23 mg/dL   Creatinine, Ser 1.00  0.50 - 1.35 mg/dL   Calcium 8.7  8.4 - 10.5 mg/dL   GFR calc non Af Amer 76 (*) >90 mL/min   GFR calc Af Amer 89 (*) >90 mL/min   Comment: (NOTE)     The eGFR has been calculated using the CKD EPI equation.     This calculation has not been validated in all clinical situations.     eGFR's persistently <90 mL/min signify possible Chronic Kidney     Disease.  TYPE AND SCREEN     Status: None   Collection Time    03/13/13 12:00 PM      Result Value Ref Range   ABO/RH(D) B POS     Antibody Screen NEG     Sample Expiration 03/16/2013    ABO/RH     Status: None   Collection Time    03/13/13 12:00 PM      Result Value Ref Range   ABO/RH(D) B POS    POC OCCULT BLOOD, ED     Status: Abnormal   Collection Time    03/13/13  1:33 PM      Result Value Ref Range   Fecal Occult Bld POSITIVE (*) NEGATIVE  HEMOGLOBIN     Status: Abnormal   Collection Time    03/13/13  6:46 PM      Result Value Ref Range   Hemoglobin 11.3 (*) 13.0 - 17.0 g/dL  HEMOGLOBIN     Status: Abnormal   Collection Time    03/14/13 12:40 AM      Result Value Ref Range   Hemoglobin 10.8 (*) 13.0 - 17.0 g/dL  HEMOGLOBIN     Status: Abnormal  Collection Time    03/14/13  8:35 AM      Result Value Ref Range   Hemoglobin 10.9 (*) 13.0 - 17.0 g/dL    MICRO: none IMAGING: Ct Abdomen Pelvis W Contrast  03/13/2013   CLINICAL DATA:  Bloody stools for 1 day. History  of pneumoperitoneum, C difficile colitis, and previous intra-abdominal abscess.  EXAM: CT ABDOMEN AND PELVIS WITH CONTRAST  TECHNIQUE: Multidetector CT imaging of the abdomen and pelvis was performed using the standard protocol following bolus administration of intravenous contrast.  CONTRAST:  11mL OMNIPAQUE IOHEXOL 300 MG/ML  SOLN  COMPARISON:  CT ABD/PELVIS W CM dated 12/22/2012; DG ABD 2 VIEWS dated 12/18/2012; CT ABD/PELVIS W CM dated 12/15/2012; CT ABD/PELV WO CM dated 12/05/2012; CT ABD/PELVIS W CM dated 12/11/2012  FINDINGS: No lung infiltrates.  No effusion or pneumothorax.  Pneumoperitoneum previously detected has resolved. Left lower quadrant fluid collection noted previously also resolved.  Persistent left perirectal peripherally enhancing fluid collection essentially stable in size when compared most recent priors, measuring 15 x 20 mm cross-section on image 82, extending over a craniocaudal length of 26 mm. It is immediately adjacent to the wall of the rectum. There is moderate surrounding stranding. Residual perirectal abscess is likely, and possibly correlates with bloody stools.  Slight interval growth in a hypoattenuating lesion involving the left lobe of the liver now measuring 25 x 23 mm as compared with 17 x 15 mm on 12 714. Given the previous suspected intraperitoneal inflammatory process, a liver abscess cannot be excluded. Tissue sampling may be warranted. No other similar lesions.  Unremarkable appearing and unchanged spleen, pancreas, kidneys, adrenal glands, and gallbladder. No bowel obstruction. No appendiceal inflammation. No worrisome osseous lesions. Atheromatous change of the aorta without aneurysmal dilatation. Previously noted air in the bladder has resolved and was likely from previous instrumentation. Mild prostatic calcifications.  IMPRESSION: Pneumoperitoneum and left lower quadrant fluid collection noted previously have resolved.  Slight increased hypoattenuating lesion  involving the left lobe of the liver measuring 25 x 23 mm cross-section. Liver abscess not excluded.  Persistent left perirectal peripherally enhancing fluid collection measuring 15 x 20 x 26 mm, likely residual perirectal abscess.   Electronically Signed   By: Rolla Flatten M.D.   On: 03/13/2013 18:13    Assessment/Plan:  68yo M with prior history of abdominal infection/cdifficile colitis presents with bloody stools. CT shows rectal lesion with stranding concern for perirectal abscess/mass. Discussion with general surgery believes that rectal mass concerning for anal cancer   - would defer to GI and general surgery for further work up  -  Will d/c IV metronidazole and d/c once stool work up returns and is negative.  - left thigh pain -> would treat for NSAIDs or robaxin. PRN  Elzie Rings [facility] for Infectious Diseases [phone]

## 2013-03-14 NOTE — Progress Notes (Signed)
[facility] PROGRESS NOTE  REFAEL Little VOZ:[id number] DOB: 01-02-1946 DOA: 03/13/2013 PCP: [name], MD  Assessment/Plan  Lower GI bleed, increased brightness of the applied in the bowel movements overnight. Rectal exam demonstrates large rectal mass. -  GI consultation for colonoscopy and biopsy -  General surgery consulted -  Will likely need oncology evaluation after pathology reported -  Okay to decrease frequency of H&H -  Repeat labs in the morning  Residual abscess in liver and perirectal space, initially reported as enlarging liver abscess, however after discussion with radiology, the changes noted on CT are probably secondary to respiration variation. Areas are too small for aspiration. Patient is afebrile, without leukocytosis. No indication for antibiotics at this time. 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 -  Frequent reorientation, use a sign interpreter for communication  Hypertension, blood pressure within normal limits -  Continue holding blood pressure medications except for metoprolol  Neuropathy, stable, continue Neurontin  Diet:  Clear liquid Access:  PIV IVF:  Yes Proph:  SCDs  Code Status: Full code Family Communication: Spoke with patient and his sister Disposition Plan: Pending further evaluation of rectal mass   Consultants:  General surgery  Infectious disease  Gastroenterology  Procedures:  CT of abdomen and pelvis  Antibiotics:  None   HPI/Subjective:  Patient denies nausea, vomiting. She may have had some weight loss. He denies significant abdominal pain.  Objective: Filed Vitals:   03/13/13 2100 03/13/13 2258 03/14/13 0515 03/14/13 1341  BP: 120/80 132/73 117/70 121/74  Pulse: 74 69 68 92  Temp:  97.9 F (36.6 C) 97.8 F (36.6 C) 97.4 F (36.3 C)  TempSrc:  Oral Rectal Oral  Resp:  16 18 18   SpO2:  93% 100% 95%   No intake or output data in the 24 hours ending 03/14/13 1801 There  were no vitals filed for this visit.  Exam:   General:  Caucasian male, No acute distress  HEENT:  NCAT, MMM  Cardiovascular:  RRR, nl S1, S2 no mrg, 2+ pulses, warm extremities  Respiratory:  CTAB, no increased WOB  Abdomen:   NABS, soft, NT/ND  GU:  Brown stool with some blood on rectal exam, large palpable mass  MSK:   Normal tone and bulk, no LEE  Neuro:  Grossly intact  Data Reviewed: Basic Metabolic Panel:  Recent Labs Lab 03/13/13 1200  NA 143  K 3.9  CL 103  CO2 25  GLUCOSE 94  BUN 13  CREATININE 1.00  CALCIUM 8.7   Liver Function Tests: No results found for this basename: AST, ALT, ALKPHOS, BILITOT, PROT, ALBUMIN,  in the last 168 hours No results found for this basename: LIPASE, AMYLASE,  in the last 168 hours No results found for this basename: AMMONIA,  in the last 168 hours CBC:  Recent Labs Lab 03/13/13 1200 03/13/13 1846 03/14/13 0040 03/14/13 0835  WBC 7.8  --   --   --   NEUTROABS 4.9  --   --   --   HGB 12.1* 11.3* 10.8* 10.9*  HCT 37.4*  --   --   --   MCV 84.8  --   --   --   PLT 329  --   --   --    Cardiac Enzymes: No results found for this basename: CKTOTAL, CKMB, CKMBINDEX, TROPONINI,  in the last 168 hours BNP (last 3 results)  Recent Labs  12/07/12 0335 12/18/12 0300  PROBNP  680.8* 53.8   CBG: No results found for this basename: GLUCAP,  in the last 168 hours  No results found for this or any previous visit (from the past 240 hour(s)).   Studies: Ct Abdomen Pelvis W Contrast  03/14/2013   ADDENDUM REPORT: 03/14/2013 14:22  ADDENDUM: The original report was by Dr. Nyoka Lint. The following addendum is by Dr. Van Clines:  I spoke by telephone about this case with Dr. Michael Boston at 2:15 p.m. on 03/13/2013.  He noted that on physical exam the patient has a hard rectal mass. Accordingly I am suspicious that the left perirectal 2.0 x 1.5 cm lesion is actually a metastatic lymph node and also that the enlarging  hypodense lesion in the lateral segment left hepatic lobe is probably a metastatic lesion. We discussed this relative likelihood.  There is a marginally enhancing fluid collection just below the right ischial tuberosity extending towards the skin surface along the right lower buttock which may reflect a decubitus ulcer.   Electronically Signed   By: Sherryl Barters M.D.   On: 03/14/2013 14:22   03/14/2013   CLINICAL DATA:  Bloody stools for 1 day. History of pneumoperitoneum, C difficile colitis, and previous intra-abdominal abscess.  EXAM: CT ABDOMEN AND PELVIS WITH CONTRAST  TECHNIQUE: Multidetector CT imaging of the abdomen and pelvis was performed using the standard protocol following bolus administration of intravenous contrast.  CONTRAST:  137mL OMNIPAQUE IOHEXOL 300 MG/ML  SOLN  COMPARISON:  CT ABD/PELVIS W CM dated 12/22/2012; DG ABD 2 VIEWS dated 12/18/2012; CT ABD/PELVIS W CM dated 12/15/2012; CT ABD/PELV WO CM dated 12/05/2012; CT ABD/PELVIS W CM dated 12/11/2012  FINDINGS: No lung infiltrates.  No effusion or pneumothorax.  Pneumoperitoneum previously detected has resolved. Left lower quadrant fluid collection noted previously also resolved.  Persistent left perirectal peripherally enhancing fluid collection essentially stable in size when compared most recent priors, measuring 15 x 20 mm cross-section on image 82, extending over a craniocaudal length of 26 mm. It is immediately adjacent to the wall of the rectum. There is moderate surrounding stranding. Residual perirectal abscess is likely, and possibly correlates with bloody stools.  Slight interval growth in a hypoattenuating lesion involving the left lobe of the liver now measuring 25 x 23 mm as compared with 17 x 15 mm on 12 714. Given the previous suspected intraperitoneal inflammatory process, a liver abscess cannot be excluded. Tissue sampling may be warranted. No other similar lesions.  Unremarkable appearing and unchanged spleen, pancreas,  kidneys, adrenal glands, and gallbladder. No bowel obstruction. No appendiceal inflammation. No worrisome osseous lesions. Atheromatous change of the aorta without aneurysmal dilatation. Previously noted air in the bladder has resolved and was likely from previous instrumentation. Mild prostatic calcifications.  IMPRESSION: Pneumoperitoneum and left lower quadrant fluid collection noted previously have resolved.  Slight increased hypoattenuating lesion involving the left lobe of the liver measuring 25 x 23 mm cross-section. Liver abscess not excluded.  Persistent left perirectal peripherally enhancing fluid collection measuring 15 x 20 x 26 mm, likely residual perirectal abscess.  Electronically Signed: By: Rolla Flatten M.D. On: 03/13/2013 18:13    Scheduled Meds: . gabapentin  300 mg Oral TID  . loratadine  10 mg Oral Daily  . metoprolol tartrate  25 mg Oral Daily  . mirtazapine  7.5 mg Oral QHS  . multivitamin with minerals  1 tablet Oral Daily  . pantoprazole (PROTONIX) IV  40 mg Intravenous Q24H  . saccharomyces boulardii  250 mg Oral  BID   Continuous Infusions: . sodium chloride 100 mL/hr at 03/14/13 1338    Principal Problem:   Lower GI bleed Active Problems:   Organic brain syndrome (chronic)   Deaf   HTN (hypertension)   Neuropathy   Mute    Time spent: 30 min    Jerry Haugen, [facility] Pager [phone]. If 7PM-7AM, please contact night-coverage at [email or web address], password [facility] 03/14/2013, 6:01 PM  LOS: 1 day

## 2013-03-14 NOTE — Plan of Care (Signed)
Problem: Phase II Progression Outcomes Goal: No active bleeding Outcome: Not Progressing Pt has bleed coming from rectal area

## 2013-03-14 NOTE — Consult Note (Signed)
Unassigned Consult  Reason for Consult: Hematochezia Referring Physician: [facility].  Kyung [name] HPI: This is a 68 year old male with a PMH of organic brain syndrome, recent pneumoperitoneum, prior history of hematochezia, and other medical issues admitted for hematochezia.  He reports his bleeding to the staff at the [facility] and subsequently he was transferred to the ER for further evaluation.  There is a history of hematochezia in the past and apparently he had a colonoscopy, but I do not have any records for the work up.  No complaints of abdominal pain.  Prior CT scans do not reveal any evidence of diverticula.  His HGB is currently stable in the 10 range.  Past Medical History  Diagnosis Date  . Mental retardation   . Organic brain syndrome   . Hypertension   . Deaf   . Clostridium difficile colitis   . Pneumonia   . Cellulitis   . Deaf   . Mutism   . Neuropathy     Past Surgical History  Procedure Laterality Date  . Orif right patella  09/23/2005  . Removal of right patella hardware  01/26/2006    History reviewed. No pertinent family history.  Social History:  reports that he has never smoked. He has never used smokeless tobacco. He reports that he drinks about 1.8 ounces of alcohol per week. He reports that he does not use illicit drugs.  Allergies: No Known Allergies  Medications:   Results for orders placed during the hospital encounter of 03/13/13 (from the past 24 hour(s))  HEMOGLOBIN     Status: Abnormal   Collection Time    03/13/13  6:46 PM      Result Value Ref Range   Hemoglobin 11.3 (*) 13.0 - 17.0 g/dL  HEMOGLOBIN     Status: Abnormal   Collection Time    03/14/13 12:40 AM      Result Value Ref Range   Hemoglobin 10.8 (*) 13.0 - 17.0 g/dL  HEMOGLOBIN     Status: Abnormal   Collection Time    03/14/13  8:35 AM      Result Value Ref Range   Hemoglobin 10.9 (*) 13.0 - 17.0 g/dL     Ct Abdomen Pelvis W Contrast  03/13/2013    CLINICAL DATA:  Bloody stools for 1 day. History of pneumoperitoneum, C difficile colitis, and previous intra-abdominal abscess.  EXAM: CT ABDOMEN AND PELVIS WITH CONTRAST  TECHNIQUE: Multidetector CT imaging of the abdomen and pelvis was performed using the standard protocol following bolus administration of intravenous contrast.  CONTRAST:  167mL OMNIPAQUE IOHEXOL 300 MG/ML  SOLN  COMPARISON:  CT ABD/PELVIS W CM dated 12/22/2012; DG ABD 2 VIEWS dated 12/18/2012; CT ABD/PELVIS W CM dated 12/15/2012; CT ABD/PELV WO CM dated 12/05/2012; CT ABD/PELVIS W CM dated 12/11/2012  FINDINGS: No lung infiltrates.  No effusion or pneumothorax.  Pneumoperitoneum previously detected has resolved. Left lower quadrant fluid collection noted previously also resolved.  Persistent left perirectal peripherally enhancing fluid collection essentially stable in size when compared most recent priors, measuring 15 x 20 mm cross-section on image 82, extending over a craniocaudal length of 26 mm. It is immediately adjacent to the wall of the rectum. There is moderate surrounding stranding. Residual perirectal abscess is likely, and possibly correlates with bloody stools.  Slight interval growth in a hypoattenuating lesion involving the left lobe of the liver now measuring 25 x 23 mm as compared with 17 x 15 mm on 12 714.  Given the previous suspected intraperitoneal inflammatory process, a liver abscess cannot be excluded. Tissue sampling may be warranted. No other similar lesions.  Unremarkable appearing and unchanged spleen, pancreas, kidneys, adrenal glands, and gallbladder. No bowel obstruction. No appendiceal inflammation. No worrisome osseous lesions. Atheromatous change of the aorta without aneurysmal dilatation. Previously noted air in the bladder has resolved and was likely from previous instrumentation. Mild prostatic calcifications.  IMPRESSION: Pneumoperitoneum and left lower quadrant fluid collection noted previously have resolved.   Slight increased hypoattenuating lesion involving the left lobe of the liver measuring 25 x 23 mm cross-section. Liver abscess not excluded.  Persistent left perirectal peripherally enhancing fluid collection measuring 15 x 20 x 26 mm, likely residual perirectal abscess.   Electronically Signed   By: Rolla Flatten M.D.   On: 03/13/2013 18:13    ROS:  As stated above in the HPI otherwise negative.  Blood pressure 121/74, pulse 92, temperature 97.4 F (36.3 C), temperature source Oral, resp. rate 18, SpO2 95.00%.    PE: Gen: NAD, Alert and Oriented HEENT:  [place]/AT, EOMI Neck: Supple, no LAD Lungs: CTA Bilaterally CV: RRR without M/G/R ABM: Soft, NTND, +BS Ext: No C/C/E  Assessment/Plan: 1) Hematochezia. 2) Multiple medical problems.   With the findings of hematochezia further evaluation with a colonoscopy will be pursued.  I cannot communicate with his a the sign language interpreter is not available.    Plan: 1) Colonoscopy tomorrow.  Edee Nifong D 03/14/2013, 1:54 PM

## 2013-03-14 NOTE — Progress Notes (Signed)
Clinical Social Work  CSW received a call from [employer] at [facility] who reports that CSW needs to apply for another pasarr for patient. SNF reports they were aware that patient has a level Z pasarr and it is not valid for SNF. SNF was aware that pasarr was expired and did not re-apply. CSW submitted for pasarr and sent clinicals to [facility] for review. CSW will continue to follow.  [place], [place] [phone]

## 2013-03-14 NOTE — Consult Note (Signed)
Victor Little 08-24-1945  [id number].   Primary Care MD: unknown Requesting MD: Dr. Janece Canterbury Chief Complaint/Reason for Consult: rectal and liver abscess HPI: This is a 68 yo white male who is mute/deaf and has MR.  Minimal history is able to be obtained as I'm not sure how well he can read and write.  He did not communicate well with me that way.  We have a sign interpretor come up.  He communicated some that way, but otherwise just copied her.  Per the chart, he was brought to  [facility] last night with about a 24 hr history of rectal bleeding.  The patient does admit to some abdominal pain, but can't quantify it.  He denies nausea or vomiting.  The RN reports continued blood in his stool over night and today, but his hgb has remained relatively stable.  He had a CT scan that revealed possible liver fluid collection and perirectal collection.  In December of 2014, he had C.diff and free air that improved on it's own.  These two areas were there at that time as well.  They were felt to possibly be abscesses secondary to this perforation.  As stated, these were seen again on this CT scan.  We were consulted for evaluation.  ROS : Please see HPI, otherwise a review of systems is unable to be obtain secondary to mental status and mute/deaf.  History reviewed. No pertinent family history.  Past Medical History  Diagnosis Date  . Mental retardation   . Organic brain syndrome   . Hypertension   . Deaf   . Clostridium difficile colitis   . Pneumonia   . Cellulitis   . Deaf   . Mutism   . Neuropathy     Past Surgical History  Procedure Laterality Date  . Orif right patella  09/23/2005  . Removal of right patella hardware  01/26/2006    Social History:  reports that he has never smoked. He has never used smokeless tobacco. He reports that he drinks about 1.8 ounces of alcohol per week. He reports that he does not use illicit drugs.  Allergies: No Known Allergies  Medications Prior to  Admission  Medication Sig Dispense Refill  . albuterol (PROVENTIL) (5 MG/ML) 0.5% nebulizer solution Take 0.5 mLs (2.5 mg total) by nebulization every 2 (two) hours as needed for wheezing or shortness of breath.  20 mL  12  . benazepril (LOTENSIN) 5 MG tablet Take 5 mg by mouth daily.      . feeding supplement, ENSURE, (ENSURE) PUDG Take 1 Container by mouth daily.  30 Container  11  . ferrous sulfate 325 (65 FE) MG tablet Take 325 mg by mouth daily with breakfast.      . furosemide (LASIX) 20 MG tablet Take 20 mg by mouth daily.      Marland Kitchen gabapentin (NEURONTIN) 300 MG capsule Take 1 capsule by mouth 3 (three) times daily.      Marland Kitchen loratadine (CLARITIN) 10 MG tablet Take 10 mg by mouth daily.      . metoprolol tartrate (LOPRESSOR) 25 MG tablet Take 25 mg by mouth daily.      . mirtazapine (REMERON) 7.5 MG tablet Take 7.5 mg by mouth at bedtime.      . Multiple Vitamin (MULTIVITAMIN WITH MINERALS) TABS tablet Take 1 tablet by mouth daily.      Marland Kitchen PRESCRIPTION MEDICATION Take 120 mLs by mouth 3 (three) times daily. Med Plus      .  Probiotic Product (PROBIOTIC FORMULA PO) Take 1 capsule by mouth daily.      . traMADol (ULTRAM) 50 MG tablet Take 50 mg by mouth every 6 (six) hours as needed.        Blood pressure 117/70, pulse 68, temperature 97.8 F (36.6 C), temperature source Rectal, resp. rate 18, SpO2 100.00%. Physical Exam: General: pleasant, WD, WN white male who is laying in bed in NAD, but does not understand much of our communication HEENT: head is normocephalic, atraumatic.  Sclera are noninjected.  PERRL.  Ears and nose without any masses or lesions.  Mouth is pink. Heart: regular, rate, and rhythm.  Normal s1,s2. No obvious murmurs, gallops, or rubs noted.  Palpable radial and pedal pulses bilaterally Lungs: CTAB, no wheezes, rhonchi, or rales noted.  Respiratory effort nonlabored Abd: soft, seems nontender, but can't tell, ND, +BS, no masses, hernias, or organomegaly Rectal: rectal mass  palpable at about 5-6cm from the anal verge.  This is hard.  Stool not palpable  Blood noted on glove. MS: all 4 extremities are symmetrical with no cyanosis, clubbing, or edema. Skin: warm and dry with no masses, lesions, or rashes Psych: alert, unable to determine orientation    Results for orders placed during the hospital encounter of 03/13/13 (from the past 48 hour(s))  CBC WITH DIFFERENTIAL     Status: Abnormal   Collection Time    03/13/13 12:00 PM      Result Value Ref Range   WBC 7.8  4.0 - 10.5 K/uL   RBC 4.41  4.22 - 5.81 MIL/uL   Hemoglobin 12.1 (*) 13.0 - 17.0 g/dL   HCT 37.4 (*) 39.0 - 52.0 %   MCV 84.8  78.0 - 100.0 fL   MCH 27.4  26.0 - 34.0 pg   MCHC 32.4  30.0 - 36.0 g/dL   RDW 14.6  11.5 - 15.5 %   Platelets 329  150 - 400 K/uL   Neutrophils Relative % 62  43 - 77 %   Neutro Abs 4.9  1.7 - 7.7 K/uL   Lymphocytes Relative 25  12 - 46 %   Lymphs Abs 2.0  0.7 - 4.0 K/uL   Monocytes Relative 7  3 - 12 %   Monocytes Absolute 0.6  0.1 - 1.0 K/uL   Eosinophils Relative 5  0 - 5 %   Eosinophils Absolute 0.4  0.0 - 0.7 K/uL   Basophils Relative 1  0 - 1 %   Basophils Absolute 0.1  0.0 - 0.1 K/uL  BASIC METABOLIC PANEL     Status: Abnormal   Collection Time    03/13/13 12:00 PM      Result Value Ref Range   Sodium 143  137 - 147 mEq/L   Potassium 3.9  3.7 - 5.3 mEq/L   Chloride 103  96 - 112 mEq/L   CO2 25  19 - 32 mEq/L   Glucose, Bld 94  70 - 99 mg/dL   BUN 13  6 - 23 mg/dL   Creatinine, Ser 1.00  0.50 - 1.35 mg/dL   Calcium 8.7  8.4 - 10.5 mg/dL   GFR calc non Af Amer 76 (*) >90 mL/min   GFR calc Af Amer 89 (*) >90 mL/min   Comment: (NOTE)     The eGFR has been calculated using the CKD EPI equation.     This calculation has not been validated in all clinical situations.     eGFR's persistently <90 mL/min signify  possible Chronic Kidney     Disease.  TYPE AND SCREEN     Status: None   Collection Time    03/13/13 12:00 PM      Result Value Ref Range    ABO/RH(D) B POS     Antibody Screen NEG     Sample Expiration 03/16/2013    ABO/RH     Status: None   Collection Time    03/13/13 12:00 PM      Result Value Ref Range   ABO/RH(D) B POS    POC OCCULT BLOOD, ED     Status: Abnormal   Collection Time    03/13/13  1:33 PM      Result Value Ref Range   Fecal Occult Bld POSITIVE (*) NEGATIVE  HEMOGLOBIN     Status: Abnormal   Collection Time    03/13/13  6:46 PM      Result Value Ref Range   Hemoglobin 11.3 (*) 13.0 - 17.0 g/dL  HEMOGLOBIN     Status: Abnormal   Collection Time    03/14/13 12:40 AM      Result Value Ref Range   Hemoglobin 10.8 (*) 13.0 - 17.0 g/dL  HEMOGLOBIN     Status: Abnormal   Collection Time    03/14/13  8:35 AM      Result Value Ref Range   Hemoglobin 10.9 (*) 13.0 - 17.0 g/dL   Ct Abdomen Pelvis W Contrast  03/13/2013   CLINICAL DATA:  Bloody stools for 1 day. History of pneumoperitoneum, C difficile colitis, and previous intra-abdominal abscess.  EXAM: CT ABDOMEN AND PELVIS WITH CONTRAST  TECHNIQUE: Multidetector CT imaging of the abdomen and pelvis was performed using the standard protocol following bolus administration of intravenous contrast.  CONTRAST:  OMNIPAQUE IOHEXOL 300 MG/ML  SOLN  COMPARISON:  CT ABD/PELVIS W CM dated 12/22/2012; DG ABD 2 VIEWS dated 12/18/2012; CT ABD/PELVIS W CM dated 12/15/2012; CT ABD/PELV WO CM dated 12/05/2012; CT ABD/PELVIS W CM dated 12/11/2012  FINDINGS: No lung infiltrates.  No effusion or pneumothorax.  Pneumoperitoneum previously detected has resolved. Left lower quadrant fluid collection noted previously also resolved.  Persistent left perirectal peripherally enhancing fluid collection essentially stable in size when compared most recent priors, measuring 15 x 20 mm cross-section on image 82, extending over a craniocaudal length of 26 mm. It is immediately adjacent to the wall of the rectum. There is moderate surrounding stranding. Residual perirectal abscess is likely,  and possibly correlates with bloody stools.  Slight interval growth in a hypoattenuating lesion involving the left lobe of the liver now measuring 25 x 23 mm as compared with 17 x 15 mm on 12 714. Given the previous suspected intraperitoneal inflammatory process, a liver abscess cannot be excluded. Tissue sampling may be warranted. No other similar lesions.  Unremarkable appearing and unchanged spleen, pancreas, kidneys, adrenal glands, and gallbladder. No bowel obstruction. No appendiceal inflammation. No worrisome osseous lesions. Atheromatous change of the aorta without aneurysmal dilatation. Previously noted air in the bladder has resolved and was likely from previous instrumentation. Mild prostatic calcifications.  IMPRESSION: Pneumoperitoneum and left lower quadrant fluid collection noted previously have resolved.  Slight increased hypoattenuating lesion involving the left lobe of the liver measuring 25 x 23 mm cross-section. Liver abscess not excluded.  Persistent left perirectal peripherally enhancing fluid collection measuring 15 x 20 x 26 mm, likely residual perirectal abscess.   Electronically Signed   By: Davonna Belling M.D.   On: 03/13/2013 18:13  Assessment/Plan 1. Rectal mass, likely cancer 2. Lower GI bleeding, secondary to #1 3. Liver lesion, ? Fluid vs met 4. Perirectal collection 5. Mute/deaf 6. MR  Plan: 1. Patient has an obviously palpable rectal mass.  GI needs to evaluate the patient for a colonoscopy and biopsies.  If this is a malignancy, then he will need the full rectal cancer staging workup including EUS, CT scan of his chest, etc.   2. The area in the liver may be a met vs a residual fluid collection from his free air in December.  He is not febrile or have a WBC.  I think this along with the area near his rectum are incidental.  Nothing to do about these currently.  Will defer further workup to GI at this point.  We will follow.  Dawn Kiper E 03/14/2013, 1:18  PM Pager: [phone]

## 2013-03-15 ENCOUNTER — Inpatient Hospital Stay (HOSPITAL_COMMUNITY): Payer: PRIVATE HEALTH INSURANCE

## 2013-03-15 ENCOUNTER — Encounter (HOSPITAL_COMMUNITY): Payer: Self-pay | Admitting: Radiology

## 2013-03-15 DIAGNOSIS — I10 Essential (primary) hypertension: Secondary | ICD-10-CM

## 2013-03-15 LAB — CBC
HEMATOCRIT: 32.9 % — AB (ref 39.0–52.0)
Hemoglobin: 10.5 g/dL — ABNORMAL LOW (ref 13.0–17.0)
MCH: 27.2 pg (ref 26.0–34.0)
MCHC: 31.9 g/dL (ref 30.0–36.0)
MCV: 85.2 fL (ref 78.0–100.0)
Platelets: 267 10*3/uL (ref 150–400)
RBC: 3.86 MIL/uL — ABNORMAL LOW (ref 4.22–5.81)
RDW: 14.8 % (ref 11.5–15.5)
WBC: 7 10*3/uL (ref 4.0–10.5)

## 2013-03-15 LAB — BASIC METABOLIC PANEL
BUN: 10 mg/dL (ref 6–23)
CHLORIDE: 107 meq/L (ref 96–112)
CO2: 23 mEq/L (ref 19–32)
Calcium: 7.9 mg/dL — ABNORMAL LOW (ref 8.4–10.5)
Creatinine, Ser: 1.02 mg/dL (ref 0.50–1.35)
GFR calc Af Amer: 86 mL/min — ABNORMAL LOW (ref >90)
GFR calc non Af Amer: 75 mL/min — ABNORMAL LOW (ref >90)
Glucose, Bld: 94 mg/dL (ref 70–99)
Potassium: 3.8 mEq/L (ref 3.7–5.3)
Sodium: 140 mEq/L (ref 137–147)

## 2013-03-15 LAB — HIV ANTIBODY (ROUTINE TESTING W REFLEX): HIV: NONREACTIVE

## 2013-03-15 MED ORDER — IOHEXOL 300 MG/ML  SOLN
80.0000 mL | Freq: Once | INTRAMUSCULAR | Status: AC | PRN
  Administered 2013-03-15: 80 mL via INTRAVENOUS

## 2013-03-15 MED ORDER — PEG 3350-KCL-NA BICARB-NACL 420 G PO SOLR
4000.0000 mL | Freq: Once | ORAL | Status: AC
  Administered 2013-03-15: 4000 mL via ORAL

## 2013-03-15 NOTE — Progress Notes (Signed)
The patient only drank half of the golytely.  The colonoscopy was cancelled and I was able to speak with the patient through the interpreter.  He will be reprepped this evening and a colonoscopy will be performed tomorrow AM.  Also, there is no prior history of a colonoscopy.

## 2013-03-15 NOTE — Progress Notes (Signed)
Pt has finished about half of the gallon of go-lytle.  He does not want to take any more.  Keep encouraging him to take sips and he will.  He then will give the sign that he is full.  Since pt is unable to hear or speak it makes communication impossible. Has had one large very soft bloody stool earlier tonight.  Will keep trying but he keeps showing he is full.

## 2013-03-15 NOTE — Progress Notes (Signed)
[facility] PROGRESS NOTE  MIKAELE STEFFEK [id number] DOB: 1945/06/05 DOA: 03/13/2013 PCP: [name], MD  Assessment/Plan  Lower GI bleed likely due to rectal mass, probable rectal cancer -  Apprecaite GI assistance:  [place] surgery assistance -  Will likely need oncology evaluation after pathology reported -  Daily cbc  Residual abscess in liver and perirectal space, initially reported as enlarging liver abscess, however after discussion with radiology, the changes noted on CT are probably secondary to respiration variation. Areas are too small for aspiration. Patient is afebrile, without leukocytosis. No indication for antibiotics at this time. 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 -  Frequent reorientation, use a sign interpreter for communication  Hypertension, blood pressure within normal limits -  Continue holding blood pressure medications except for metoprolol  Neuropathy, stable, continue Neurontin  Diet:  Clear liquid Access:  PIV IVF:  Yes Proph:  SCDs  Code Status: Full code Family Communication: Spoke with patient and his sister Disposition Plan: Pending further evaluation of rectal mass   Consultants:  General surgery  Infectious disease  Gastroenterology  Procedures:  CT of abdomen and pelvis  Antibiotics:  None   HPI/Subjective:  Patient denies nausea, vomiting, abdominal pain.  May still be having some blood in stools, but difficult to tell.  Patient difficult historian due to organic brain syndrome.  Objective: Filed Vitals:   03/14/13 0515 03/14/13 1341 03/14/13 2100 03/15/13 0500  BP: 117/70 121/74 133/82 136/81  Pulse: 68 92 78 84  Temp: 97.8 F (36.6 C) 97.4 F (36.3 C) 97.2 F (36.2 C) 97.2 F (36.2 C)  TempSrc: Rectal Oral Axillary Axillary  Resp: 18 18 18 18   SpO2: 100% 95% 98% 99%    Intake/Output Summary (Last 24 hours) at 03/15/13  [id number] Last data filed at 03/15/13 0500  Gross per 24 hour  Intake      0 ml  Output    750 ml  Net   -750 ml   There were no vitals filed for this visit.  Exam:   General:  Caucasian male, No acute distress  HEENT:  NCAT, MMM  Cardiovascular:  RRR, nl S1, S2 no mrg, 2+ pulses, warm extremities  Respiratory:  CTAB, no increased WOB  Abdomen:   NABS, soft, NT/ND  MSK:   Normal tone and bulk, no LEE  Neuro:  Grossly intact  Data Reviewed: Basic Metabolic Panel:  Recent Labs Lab 03/13/13 1200 03/15/13 0552  NA 143 140  K 3.9 3.8  CL 103 107  CO2 25 23  GLUCOSE 94 94  BUN 13 10  CREATININE 1.00 1.02  CALCIUM 8.7 7.9*   Liver Function Tests: No results found for this basename: AST, ALT, ALKPHOS, BILITOT, PROT, ALBUMIN,  in the last 168 hours No results found for this basename: LIPASE, AMYLASE,  in the last 168 hours No results found for this basename: AMMONIA,  in the last 168 hours CBC:  Recent Labs Lab 03/13/13 1200 03/13/13 1846 03/14/13 0040 03/14/13 0835 03/14/13 1741 03/15/13 0552  WBC 7.8  --   --   --   --  7.0  NEUTROABS 4.9  --   --   --   --   --   HGB 12.1* 11.3* 10.8* 10.9* 11.5* 10.5*  HCT 37.4*  --   --   --  35.6* 32.9*  MCV 84.8  --   --   --   --  85.2  PLT 329  --   --   --   --  267   Cardiac Enzymes: No results found for this basename: CKTOTAL, CKMB, CKMBINDEX, TROPONINI,  in the last 168 hours BNP (last 3 results)  Recent Labs  12/07/12 0335 12/18/12 0300  PROBNP 680.8* 53.8   CBG: No results found for this basename: GLUCAP,  in the last 168 hours  No results found for this or any previous visit (from the past 240 hour(s)).   Studies: Ct Abdomen Pelvis W Contrast  03/14/2013   ADDENDUM REPORT: 03/14/2013 14:22  ADDENDUM: The original report was by Dr. Nyoka Lint. The following addendum is by Dr. Van Clines:  I spoke by telephone about this case with Dr. Michael Boston at 2:15 p.m. on 03/13/2013.  He noted that on  physical exam the patient has a hard rectal mass. Accordingly I am suspicious that the left perirectal 2.0 x 1.5 cm lesion is actually a metastatic lymph node and also that the enlarging hypodense lesion in the lateral segment left hepatic lobe is probably a metastatic lesion. We discussed this relative likelihood.  There is a marginally enhancing fluid collection just below the right ischial tuberosity extending towards the skin surface along the right lower buttock which may reflect a decubitus ulcer.   Electronically Signed   By: Sherryl Barters M.D.   On: 03/14/2013 14:22   03/14/2013   CLINICAL DATA:  Bloody stools for 1 day. History of pneumoperitoneum, C difficile colitis, and previous intra-abdominal abscess.  EXAM: CT ABDOMEN AND PELVIS WITH CONTRAST  TECHNIQUE: Multidetector CT imaging of the abdomen and pelvis was performed using the standard protocol following bolus administration of intravenous contrast.  CONTRAST:  165mL OMNIPAQUE IOHEXOL 300 MG/ML  SOLN  COMPARISON:  CT ABD/PELVIS W CM dated 12/22/2012; DG ABD 2 VIEWS dated 12/18/2012; CT ABD/PELVIS W CM dated 12/15/2012; CT ABD/PELV WO CM dated 12/05/2012; CT ABD/PELVIS W CM dated 12/11/2012  FINDINGS: No lung infiltrates.  No effusion or pneumothorax.  Pneumoperitoneum previously detected has resolved. Left lower quadrant fluid collection noted previously also resolved.  Persistent left perirectal peripherally enhancing fluid collection essentially stable in size when compared most recent priors, measuring 15 x 20 mm cross-section on image 82, extending over a craniocaudal length of 26 mm. It is immediately adjacent to the wall of the rectum. There is moderate surrounding stranding. Residual perirectal abscess is likely, and possibly correlates with bloody stools.  Slight interval growth in a hypoattenuating lesion involving the left lobe of the liver now measuring 25 x 23 mm as compared with 17 x 15 mm on 12 714. Given the previous suspected  intraperitoneal inflammatory process, a liver abscess cannot be excluded. Tissue sampling may be warranted. No other similar lesions.  Unremarkable appearing and unchanged spleen, pancreas, kidneys, adrenal glands, and gallbladder. No bowel obstruction. No appendiceal inflammation. No worrisome osseous lesions. Atheromatous change of the aorta without aneurysmal dilatation. Previously noted air in the bladder has resolved and was likely from previous instrumentation. Mild prostatic calcifications.  IMPRESSION: Pneumoperitoneum and left lower quadrant fluid collection noted previously have resolved.  Slight increased hypoattenuating lesion involving the left lobe of the liver measuring 25 x 23 mm cross-section. Liver abscess not excluded.  Persistent left perirectal peripherally enhancing fluid collection measuring 15 x 20 x 26 mm, likely residual perirectal abscess.  Electronically Signed: By: Rolla Flatten M.D. On: 03/13/2013 18:13    Scheduled Meds: . gabapentin  300 mg Oral TID  .  loratadine  10 mg Oral Daily  . metoprolol tartrate  25 mg Oral Daily  . mirtazapine  7.5 mg Oral QHS  . multivitamin with minerals  1 tablet Oral Daily  . pantoprazole (PROTONIX) IV  40 mg Intravenous Q24H  . polyethylene glycol-electrolytes  4,000 mL Oral Once  . 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 If 7PM-7AM, please contact night-coverage at [email or web address], password [facility] 03/15/2013, 9:39 AM  LOS: 2 days

## 2013-03-15 NOTE — Progress Notes (Signed)
[place], MD, [address] [place], [place] [address] Phone: [phone] FAX: [place] [phone] 1945-07-21  CARE TEAM:  PCP: [name], MD  Outpatient Care Team: Patient Care Team: [name] as PCP - General (Internal Medicine)  Inpatient Treatment Team: Treatment Team: Attending Provider: Janece Canterbury, MD; Registered Nurse: Claris Pong, RN; Registered Nurse: Delena Bali, RN; Rounding Team: Fatima Blank, MD; Technician: Val Riles, NT; Consulting Physician: Nolon Nations, MD; Consulting Physician: Beryle Beams, MD; Registered Nurse: Janeth Rase, RN; Technician: Francoise Schaumann, NT   Subjective:  Resting in NAD No events  Objective:  Vital signs:  Filed Vitals:   03/14/13 0515 03/14/13 1341 03/14/13 2100 03/15/13 0500  BP: 117/70 121/74 133/82 136/81  Pulse: 68 92 78 84  Temp: 97.8 F (36.6 C) 97.4 F (36.3 C) 97.2 F (36.2 C) 97.2 F (36.2 C)  TempSrc: Rectal Oral Axillary Axillary  Resp: _0 SpO2: 100% 95% 98% 99%    Last BM Date: 03/15/13  Intake/Output   Yesterday:  02/27 0701 - 02/28 0700 In: -  Out: 750 [Urine:750] This shift:  Total I/O In: -  Out: 1500 [Urine:1500]  Bowel function:  Flatus: y  BM: some  Drain:   Physical Exam:  General: Pt awaken/alert in no acute distress Eyes: PERRL, normal EOM.  Sclera clear.  No icterus Neuro: CN II-XII intact w/o focal sensory/motor deficits. Lymph: No head/neck/groin lymphadenopathy Psych:  No delerium/psychosis/paranoia HENT: Normocephalic, Mucus membranes moist.  No thrush Neck: Supple, No tracheal deviation Chest: No chest wall pain w good excursion CV:  Pulses intact.  Regular rhythm MS: Normal AROM mjr joints.  No obvious deformity Abdomen: Soft.  Nondistended.  Nontender.  No evidence of peritonitis.  No incarcerated hernias. Ext:  SCDs BLE.  No mjr edema.  No  cyanosis Skin: No petechiae / purpura   Problem List:   Principal Problem:   Rectal mass, probable cancer Active Problems:   Organic brain syndrome (chronic)   Deaf   HTN (hypertension)   Neuropathy   [place]   Lower GI bleed   Liver mass, left lobe, probable metastasis   Assessment  Kyung [name]  68 y.o. male     Procedure(s): COLONOSCOPY  Rectal mass with mesorectal & liver masses concerning for metastatic rectal cancer  Plan:  I reviewed Xrays w radiology Sherryl Barters M.D.).  Picture of exam c/w cancer w mets, not perforation/liver abscess.  Recommend:  -colonoscopy & Bx - dr HUng to do once prep completed  -if +cancer (as strongly suspected):  -complete staging workup:  CT chest CEA EUS Liver mass Bx  -Med Onc consultation (Dr Julieanne Manson ideal given GI focus) -GI Tumor multidisciplinary conference to discuss palliative options  -VTE prophylaxis- SCDs, etc -mobilize as tolerated to help recovery  The patient is stable.  There is no evidence of peritonitis, acute abdomen, nor shock.  There is no strong evidence of failure of improvement nor decline with current non-operative management.  There is no need for surgery at the present moment.  We will continue to follow up on Monday   Adin Hector, M.D., F.A.C.S. Gastrointestinal and Minimally Invasive Surgery [facility] [address], [place], [place] [address] [phone] Main / Paging   03/15/2013   Results:   Labs: Results for orders placed during the hospital  encounter of 03/13/13 (from the past 48 hour(s))  CBC WITH DIFFERENTIAL     Status: Abnormal   Collection Time    03/13/13 12:00 PM      Result Value Ref Range   WBC 7.8  4.0 - 10.5 K/uL   RBC 4.41  4.22 - 5.81 MIL/uL   Hemoglobin 12.1 (*) 13.0 - 17.0 g/dL   HCT 37.4 (*) 39.0 - 52.0 %   MCV 84.8  78.0 - 100.0 fL   MCH 27.4  26.0 - 34.0 pg   MCHC 32.4  30.0 - 36.0 g/dL   RDW 14.6  11.5 -  15.5 %   Platelets 329  150 - 400 K/uL   Neutrophils Relative % 62  43 - 77 %   Neutro Abs 4.9  1.7 - 7.7 K/uL   Lymphocytes Relative 25  12 - 46 %   Lymphs Abs 2.0  0.7 - 4.0 K/uL   Monocytes Relative 7  3 - 12 %   Monocytes Absolute 0.6  0.1 - 1.0 K/uL   Eosinophils Relative 5  0 - 5 %   Eosinophils Absolute 0.4  0.0 - 0.7 K/uL   Basophils Relative 1  0 - 1 %   Basophils Absolute 0.1  0.0 - 0.1 K/uL  BASIC METABOLIC PANEL     Status: Abnormal   Collection Time    03/13/13 12:00 PM      Result Value Ref Range   Sodium 143  137 - 147 mEq/L   Potassium 3.9  3.7 - 5.3 mEq/L   Chloride 103  96 - 112 mEq/L   CO2 25  19 - 32 mEq/L   Glucose, Bld 94  70 - 99 mg/dL   BUN 13  6 - 23 mg/dL   Creatinine, Ser 1.00  0.50 - 1.35 mg/dL   Calcium 8.7  8.4 - 10.5 mg/dL   GFR calc non Af Amer 76 (*) >90 mL/min   GFR calc Af Amer 89 (*) >90 mL/min   Comment: (NOTE)     The eGFR has been calculated using the CKD EPI equation.     This calculation has not been validated in all clinical situations.     eGFR's persistently <90 mL/min signify possible Chronic Kidney     Disease.  TYPE AND SCREEN     Status: None   Collection Time    03/13/13 12:00 PM      Result Value Ref Range   ABO/RH(D) B POS     Antibody Screen NEG     Sample Expiration 03/16/2013    ABO/RH     Status: None   Collection Time    03/13/13 12:00 PM      Result Value Ref Range   ABO/RH(D) B POS    POC OCCULT BLOOD, ED     Status: Abnormal   Collection Time    03/13/13  1:33 PM      Result Value Ref Range   Fecal Occult Bld POSITIVE (*) NEGATIVE  HEMOGLOBIN     Status: Abnormal   Collection Time    03/13/13  6:46 PM      Result Value Ref Range   Hemoglobin 11.3 (*) 13.0 - 17.0 g/dL  HEMOGLOBIN     Status: Abnormal   Collection Time    03/14/13 12:40 AM      Result Value Ref Range   Hemoglobin 10.8 (*) 13.0 - 17.0 g/dL  HEMOGLOBIN     Status: Abnormal   Collection  Time    03/14/13  8:35 AM      Result Value Ref  Range   Hemoglobin 10.9 (*) 13.0 - 17.0 g/dL  HEMOGLOBIN AND HEMATOCRIT, BLOOD     Status: Abnormal   Collection Time    03/14/13  5:41 PM      Result Value Ref Range   Hemoglobin 11.5 (*) 13.0 - 17.0 g/dL   HCT 35.6 (*) 39.0 - 09.6 %  BASIC METABOLIC PANEL     Status: Abnormal   Collection Time    03/15/13  5:52 AM      Result Value Ref Range   Sodium 140  137 - 147 mEq/L   Potassium 3.8  3.7 - 5.3 mEq/L   Chloride 107  96 - 112 mEq/L   CO2 23  19 - 32 mEq/L   Glucose, Bld 94  70 - 99 mg/dL   BUN 10  6 - 23 mg/dL   Creatinine, Ser 1.02  0.50 - 1.35 mg/dL   Calcium 7.9 (*) 8.4 - 10.5 mg/dL   GFR calc non Af Amer 75 (*) >90 mL/min   GFR calc Af Amer 86 (*) >90 mL/min   Comment: (NOTE)     The eGFR has been calculated using the CKD EPI equation.     This calculation has not been validated in all clinical situations.     eGFR's persistently <90 mL/min signify possible Chronic Kidney     Disease.  CBC     Status: Abnormal   Collection Time    03/15/13  5:52 AM      Result Value Ref Range   WBC 7.0  4.0 - 10.5 K/uL   RBC 3.86 (*) 4.22 - 5.81 MIL/uL   Hemoglobin 10.5 (*) 13.0 - 17.0 g/dL   HCT 32.9 (*) 39.0 - 52.0 %   MCV 85.2  78.0 - 100.0 fL   MCH 27.2  26.0 - 34.0 pg   MCHC 31.9  30.0 - 36.0 g/dL   RDW 14.8  11.5 - 15.5 %   Platelets 267  150 - 400 K/uL    Imaging / Studies: Ct Abdomen Pelvis W Contrast  03/14/2013   ADDENDUM REPORT: 03/14/2013 14:22  ADDENDUM: The original report was by Dr. Nyoka Lint. The following addendum is by Dr. Van Clines:  I spoke by telephone about this case with Dr. Michael Boston at 2:15 p.m. on 03/13/2013.  He noted that on physical exam the patient has a hard rectal mass. Accordingly I am suspicious that the left perirectal 2.0 x 1.5 cm lesion is actually a metastatic lymph node and also that the enlarging hypodense lesion in the lateral segment left hepatic lobe is probably a metastatic lesion. We discussed this relative likelihood.  There  is a marginally enhancing fluid collection just below the right ischial tuberosity extending towards the skin surface along the right lower buttock which may reflect a decubitus ulcer.   Electronically Signed   By: Sherryl Barters M.D.   On: 03/14/2013 14:22   03/14/2013   CLINICAL DATA:  Bloody stools for 1 day. History of pneumoperitoneum, C difficile colitis, and previous intra-abdominal abscess.  EXAM: CT ABDOMEN AND PELVIS WITH CONTRAST  TECHNIQUE: Multidetector CT imaging of the abdomen and pelvis was performed using the standard protocol following bolus administration of intravenous contrast.  CONTRAST:  141mL OMNIPAQUE IOHEXOL 300 MG/ML  SOLN  COMPARISON:  CT ABD/PELVIS W CM dated 12/22/2012; DG ABD 2 VIEWS dated 12/18/2012; CT ABD/PELVIS W CM dated  12/15/2012; CT ABD/PELV WO CM dated 12/05/2012; CT ABD/PELVIS W CM dated 12/11/2012  FINDINGS: No lung infiltrates.  No effusion or pneumothorax.  Pneumoperitoneum previously detected has resolved. Left lower quadrant fluid collection noted previously also resolved.  Persistent left perirectal peripherally enhancing fluid collection essentially stable in size when compared most recent priors, measuring 15 x 20 mm cross-section on image 82, extending over a craniocaudal length of 26 mm. It is immediately adjacent to the wall of the rectum. There is moderate surrounding stranding. Residual perirectal abscess is likely, and possibly correlates with bloody stools.  Slight interval growth in a hypoattenuating lesion involving the left lobe of the liver now measuring 25 x 23 mm as compared with 17 x 15 mm on 12 714. Given the previous suspected intraperitoneal inflammatory process, a liver abscess cannot be excluded. Tissue sampling may be warranted. No other similar lesions.  Unremarkable appearing and unchanged spleen, pancreas, kidneys, adrenal glands, and gallbladder. No bowel obstruction. No appendiceal inflammation. No worrisome osseous lesions. Atheromatous  change of the aorta without aneurysmal dilatation. Previously noted air in the bladder has resolved and was likely from previous instrumentation. Mild prostatic calcifications.  IMPRESSION: Pneumoperitoneum and left lower quadrant fluid collection noted previously have resolved.  Slight increased hypoattenuating lesion involving the left lobe of the liver measuring 25 x 23 mm cross-section. Liver abscess not excluded.  Persistent left perirectal peripherally enhancing fluid collection measuring 15 x 20 x 26 mm, likely residual perirectal abscess.  Electronically Signed: By: Rolla Flatten M.D. On: 03/13/2013 18:13    Medications / Allergies: per chart  Antibiotics: Anti-infectives   None       Note: This dictation was prepared with Dragon/digital dictation along with [employer]. Any transcriptional errors that result from this process are unintentional.

## 2013-03-15 NOTE — Progress Notes (Signed)
Only about two thirds of go lyte comsumes.  When trying to get him to take more.  He now hide his head under the front pillow and blanket   We keep tying but he's had enough

## 2013-03-16 ENCOUNTER — Encounter (HOSPITAL_COMMUNITY): Payer: Self-pay | Admitting: *Deleted

## 2013-03-16 ENCOUNTER — Encounter (HOSPITAL_COMMUNITY)
Admission: EM | Disposition: A | Discharge status: Skilled Nursing Facility | Payer: Self-pay | Source: Home / Self Care | Attending: Internal Medicine

## 2013-03-16 DIAGNOSIS — R198 Other specified symptoms and signs involving the digestive system and abdomen: Secondary | ICD-10-CM

## 2013-03-16 DIAGNOSIS — C189 Malignant neoplasm of colon, unspecified: Secondary | ICD-10-CM

## 2013-03-16 DIAGNOSIS — K769 Liver disease, unspecified: Secondary | ICD-10-CM

## 2013-03-16 HISTORY — DX: Malignant neoplasm of colon, unspecified: C18.9

## 2013-03-16 HISTORY — PX: COLONOSCOPY: SHX5424

## 2013-03-16 LAB — BASIC METABOLIC PANEL
BUN: 7 mg/dL (ref 6–23)
CALCIUM: 7.9 mg/dL — AB (ref 8.4–10.5)
CO2: 23 mEq/L (ref 19–32)
Chloride: 104 mEq/L (ref 96–112)
Creatinine, Ser: 0.98 mg/dL (ref 0.50–1.35)
GFR, EST NON AFRICAN AMERICAN: 84 mL/min — AB (ref >90)
GLUCOSE: 89 mg/dL (ref 70–99)
Potassium: 3.8 mEq/L (ref 3.7–5.3)
Sodium: 139 mEq/L (ref 137–147)

## 2013-03-16 LAB — CBC
HCT: 34.5 % — ABNORMAL LOW (ref 39.0–52.0)
Hemoglobin: 11.3 g/dL — ABNORMAL LOW (ref 13.0–17.0)
MCH: 27.7 pg (ref 26.0–34.0)
MCHC: 32.8 g/dL (ref 30.0–36.0)
MCV: 84.6 fL (ref 78.0–100.0)
PLATELETS: 310 10*3/uL (ref 150–400)
RBC: 4.08 MIL/uL — ABNORMAL LOW (ref 4.22–5.81)
RDW: 14.6 % (ref 11.5–15.5)
WBC: 7.1 10*3/uL (ref 4.0–10.5)

## 2013-03-16 LAB — CEA: CEA: 24.6 ng/mL — ABNORMAL HIGH (ref 0.0–5.0)

## 2013-03-16 SURGERY — COLONOSCOPY
Anesthesia: Moderate Sedation

## 2013-03-16 MED ORDER — TRAMADOL HCL 50 MG PO TABS: 100.0000 mg | ORAL_TABLET | Freq: Four times a day (QID) | ORAL | Status: DC | PRN

## 2013-03-16 MED ORDER — FENTANYL CITRATE 0.05 MG/ML IJ SOLN
INTRAMUSCULAR | Status: AC
  Filled 2013-03-16: qty 2

## 2013-03-16 MED ORDER — SPOT INK MARKER SYRINGE KIT
PACK | SUBMUCOSAL | Status: AC
  Filled 2013-03-16: qty 10

## 2013-03-16 MED ORDER — MIDAZOLAM HCL 10 MG/2ML IJ SOLN
INTRAMUSCULAR | Status: AC
  Filled 2013-03-16: qty 2

## 2013-03-16 MED ORDER — SODIUM CHLORIDE 0.9 % IV SOLN
INTRAVENOUS | Status: DC
  Administered 2013-03-16: 10:00:00 via INTRAVENOUS

## 2013-03-16 MED ORDER — DIPHENHYDRAMINE HCL 50 MG/ML IJ SOLN
INTRAMUSCULAR | Status: AC
  Filled 2013-03-16: qty 1

## 2013-03-16 MED ORDER — MIDAZOLAM HCL 5 MG/5ML IJ SOLN
INTRAMUSCULAR | Status: DC | PRN
  Administered 2013-03-16 (×2): 2 mg via INTRAVENOUS

## 2013-03-16 MED ORDER — SPOT INK MARKER SYRINGE KIT
PACK | SUBMUCOSAL | Status: DC | PRN
  Administered 2013-03-16: 5 mL via SUBMUCOSAL

## 2013-03-16 MED ORDER — FENTANYL CITRATE 0.05 MG/ML IJ SOLN
INTRAMUSCULAR | Status: DC | PRN
  Administered 2013-03-16 (×2): 25 ug via INTRAVENOUS

## 2013-03-16 NOTE — Progress Notes (Signed)
[facility] PROGRESS NOTE  Victor Little ZJQ:[id number] DOB: [birth year] DOA: 03/13/2013 PCP: [name], MD  Assessment/Plan  Lower GI bleed due to probable colon cancer, two separate areas identified on colonoscopy today -  CEA pending -  Discussed case with Dr. 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  Used sign interpreter for communication  Objective: Filed Vitals:   03/16/13 0935 03/16/13 0940 03/16/13 0945 03/16/13 1414  BP: 133/91 130/89 129/87 118/77  Pulse: 99 96 100 86  Temp:    98.4 F (36.9 C)  TempSrc:    Oral  Resp: 32 $Remove'18 20 20  'bMwTDOB$ SpO2: 100% 100% 100% 97%    Intake/Output Summary (Last 24 hours) at 03/16/13 1948 Last data filed at 03/16/13 1759  Gross per 24 hour  Intake    370 ml  Output    600 ml  Net   -230 ml   There were no vitals filed for this visit.  Exam:   General:  Caucasian male, No acute distress  HEENT:  NCAT, MMM  Cardiovascular:  RRR, nl S1, S2 no mrg, 2+ pulses, warm extremities  Respiratory:  CTAB, no increased WOB  Abdomen:   NABS, soft, NT/ND  MSK:   Normal tone and bulk, no LEE  Neuro:  Grossly intact  Data Reviewed: Basic Metabolic Panel:  Recent Labs Lab 03/13/13 1200 03/15/13 0552  03/16/13 0608  NA 143 140 139  K 3.9 3.8 3.8  CL 103 107 104  CO2 $Re'25 23 23  'BVl$ GLUCOSE 94 94 89  BUN $Re'13 10 7  'gBQ$ CREATININE 1.00 1.02 0.98  CALCIUM 8.7 7.9* 7.9*   Liver Function Tests: No results found for this basename: AST, ALT, ALKPHOS, BILITOT, PROT, ALBUMIN,  in the last 168 hours No results found for this basename: LIPASE, AMYLASE,  in the last 168 hours No results found for this basename: AMMONIA,  in the last 168 hours CBC:  Recent Labs Lab 03/13/13 1200  03/14/13 0040 03/14/13 0835 03/14/13 1741 03/15/13 0552 03/16/13 0608  WBC 7.8  --   --   --   --  7.0 7.1  NEUTROABS 4.9  --   --   --   --   --   --   HGB 12.1*  < > 10.8* 10.9* 11.5* 10.5* 11.3*  HCT 37.4*  --   --   --  35.6* 32.9* 34.5*  MCV 84.8  --   --   --   --  85.2 84.6  PLT 329  --   --   --   --  267 310  < > = values in this interval not displayed. Cardiac Enzymes: No results found for this basename: CKTOTAL, CKMB, CKMBINDEX, TROPONINI,  in the last 168 hours BNP (last 3 results)  Recent Labs  12/07/12  0335 12/18/12 0300  PROBNP 680.8* 53.8   CBG: No results found for this basename: GLUCAP,  in the last 168 hours  No results found for this or any previous visit (from the past 240 hour(s)).   Studies: Ct Chest W Contrast  03/15/2013   CLINICAL DATA:  Suspected metastatic rectal mass, for staging. Shortness of breath, leukocytosis, history of pneumonia.  EXAM: CT CHEST WITH CONTRAST  TECHNIQUE: Multidetector CT imaging of the chest was performed during intravenous contrast administration.  CONTRAST:  34mL OMNIPAQUE IOHEXOL 300 MG/ML  SOLN  COMPARISON:  CT chest dated 12/11/2012  FINDINGS: Evaluation of the lung parenchyma is constrained by respiratory motion.  Two right upper lobe pulmonary nodules measuring up to 6 mm (series 5/images 14-15). This is significantly improved from the prior and may reflect residual post infectious/inflammatory scarring.  6 mm left upper lobe nodule (series 5/image 15),  grossly unchanged.  6 mm left lower lobe nodular opacity along the fissure (series 5/ image 20), more conspicuous on the current study but likely grossly unchanged.  Multiple tree-in-bud nodular opacities in the left lower lobe, measuring up to 5 mm (series 5/images 40 and 42), new from prior chest CT and possibly infectious/inflammatory.  Trace bilateral pleural effusions.  No pneumothorax.  Visualized thyroid is unremarkable.  The heart is normal in size. No pericardial effusion. Coronary atherosclerosis in the LAD.  Small right paratracheal and subcarinal lymph nodes measuring up to 7-8 mm Whit Bruni axis, within normal limits. No suspicious hilar or axillary lymphadenopathy.  Visualized upper abdomen is unchanged, noting a 3.1 x 2.5 cm hypoenhancing lesion in the central left hepatic lobe (series 2/image 54), suspicious for metastasis.  Degenerative changes of the visualized thoracic spine. Fusion at T9-10.  IMPRESSION: Limited evaluation due to respiratory motion.  Scattered bilateral pulmonary nodules measuring up to 6 mm, as described above. Residual right upper lobe nodularity is improved and may reflect post infectious/inflammatory scarring. Left upper lobe and superior left lower lobe nodularity are unchanged and nonspecific. Left lower lobe tree-in-bud nodularity is new and favored to be infectious/inflammatory.  None of these findings are specific for metastatic disease to the chest. Attention on follow-up is suggested, preferably when patient is better able to breath hold.   Electronically Signed   By: Julian Hy M.D.   On: 03/15/2013 19:58    Scheduled Meds: . gabapentin  300 mg Oral TID  . loratadine  10 mg Oral Daily  . metoprolol tartrate  25 mg Oral Daily  . mirtazapine  7.5 mg Oral QHS  . multivitamin with minerals  1 tablet Oral Daily  . pantoprazole (PROTONIX) IV  40 mg Intravenous Q24H  . saccharomyces boulardii  250 mg Oral BID   Continuous Infusions:    Principal Problem:    Rectal mass, probable cancer Active Problems:   Organic brain syndrome (chronic)   Deaf   HTN (hypertension)   Neuropathy   Mute   Lower GI bleed   Liver mass, left lobe, probable metastasis    Time spent: 30 min    Annistyn Depass, [facility] Pager [phone]. If 7PM-7AM, please contact night-coverage at [email or web address], password [facility] 03/16/2013, 7:48 PM  LOS: 3 days

## 2013-03-16 NOTE — H&P (View-Only) (Signed)
[place], MD, [address] [place], [place] [address] Phone: [phone] FAX: [place] [phone] 1945-07-21  CARE TEAM:  PCP: [name], MD  Outpatient Care Team: Patient Care Team: [name] as PCP - General (Internal Medicine)  Inpatient Treatment Team: Treatment Team: Attending Provider: Janece Canterbury, MD; Registered Nurse: Claris Pong, RN; Registered Nurse: Delena Bali, RN; Rounding Team: Fatima Blank, MD; Technician: Val Riles, NT; Consulting Physician: Nolon Nations, MD; Consulting Physician: Beryle Beams, MD; Registered Nurse: Janeth Rase, RN; Technician: Francoise Schaumann, NT   Subjective:  Resting in NAD No events  Objective:  Vital signs:  Filed Vitals:   03/14/13 0515 03/14/13 1341 03/14/13 2100 03/15/13 0500  BP: 117/70 121/74 133/82 136/81  Pulse: 68 92 78 84  Temp: 97.8 F (36.6 C) 97.4 F (36.3 C) 97.2 F (36.2 C) 97.2 F (36.2 C)  TempSrc: Rectal Oral Axillary Axillary  Resp: _0 SpO2: 100% 95% 98% 99%    Last BM Date: 03/15/13  Intake/Output   Yesterday:  02/27 0701 - 02/28 0700 In: -  Out: 750 [Urine:750] This shift:  Total I/O In: -  Out: 1500 [Urine:1500]  Bowel function:  Flatus: y  BM: some  Drain:   Physical Exam:  General: Pt awaken/alert in no acute distress Eyes: PERRL, normal EOM.  Sclera clear.  No icterus Neuro: CN II-XII intact w/o focal sensory/motor deficits. Lymph: No head/neck/groin lymphadenopathy Psych:  No delerium/psychosis/paranoia HENT: Normocephalic, Mucus membranes moist.  No thrush Neck: Supple, No tracheal deviation Chest: No chest wall pain w good excursion CV:  Pulses intact.  Regular rhythm MS: Normal AROM mjr joints.  No obvious deformity Abdomen: Soft.  Nondistended.  Nontender.  No evidence of peritonitis.  No incarcerated hernias. Ext:  SCDs BLE.  No mjr edema.  No  cyanosis Skin: No petechiae / purpura   Problem List:   Principal Problem:   Rectal mass, probable cancer Active Problems:   Organic brain syndrome (chronic)   Deaf   HTN (hypertension)   Neuropathy   Mute   Lower GI bleed   Liver mass, left lobe, probable metastasis   Assessment  Victor Little  68 y.o. male     Procedure(s): COLONOSCOPY  Rectal mass with mesorectal & liver masses concerning for metastatic rectal cancer  Plan:  I reviewed Xrays w radiology Sherryl Barters M.D.).  Picture of exam c/w cancer w mets, not perforation/liver abscess.  Recommend:  -colonoscopy & Bx - dr HUng to do once prep completed  -if +cancer (as strongly suspected):  -complete staging workup:  CT chest CEA EUS Liver mass Bx  -Med Onc consultation (Dr Julieanne Manson ideal given GI focus) -GI Tumor multidisciplinary conference to discuss palliative options  -VTE prophylaxis- SCDs, etc -mobilize as tolerated to help recovery  The patient is stable.  There is no evidence of peritonitis, acute abdomen, nor shock.  There is no strong evidence of failure of improvement nor decline with current non-operative management.  There is no need for surgery at the present moment.  We will continue to follow up on Monday   Adin Hector, M.D., F.A.C.S. Gastrointestinal and Minimally Invasive Surgery [facility] [address], [place], [place] [address] [phone] Main / Paging   03/15/2013   Results:   Labs: Results for orders placed during the hospital  encounter of 03/13/13 (from the past 48 hour(s))  CBC WITH DIFFERENTIAL     Status: Abnormal   Collection Time    03/13/13 12:00 PM      Result Value Ref Range   WBC 7.8  4.0 - 10.5 K/uL   RBC 4.41  4.22 - 5.81 MIL/uL   Hemoglobin 12.1 (*) 13.0 - 17.0 g/dL   HCT 54.2 (*) 48.1 - 44.3 %   MCV 84.8  78.0 - 100.0 fL   MCH 27.4  26.0 - 34.0 pg   MCHC 32.4  30.0 - 36.0 g/dL   RDW 92.6  59.9 -  78.7 %   Platelets 329  150 - 400 K/uL   Neutrophils Relative % 62  43 - 77 %   Neutro Abs 4.9  1.7 - 7.7 K/uL   Lymphocytes Relative 25  12 - 46 %   Lymphs Abs 2.0  0.7 - 4.0 K/uL   Monocytes Relative 7  3 - 12 %   Monocytes Absolute 0.6  0.1 - 1.0 K/uL   Eosinophils Relative 5  0 - 5 %   Eosinophils Absolute 0.4  0.0 - 0.7 K/uL   Basophils Relative 1  0 - 1 %   Basophils Absolute 0.1  0.0 - 0.1 K/uL  BASIC METABOLIC PANEL     Status: Abnormal   Collection Time    03/13/13 12:00 PM      Result Value Ref Range   Sodium 143  137 - 147 mEq/L   Potassium 3.9  3.7 - 5.3 mEq/L   Chloride 103  96 - 112 mEq/L   CO2 25  19 - 32 mEq/L   Glucose, Bld 94  70 - 99 mg/dL   BUN 13  6 - 23 mg/dL   Creatinine, Ser 7.65  0.50 - 1.35 mg/dL   Calcium 8.7  8.4 - 48.6 mg/dL   GFR calc non Af Amer 76 (*) >90 mL/min   GFR calc Af Amer 89 (*) >90 mL/min   Comment: (NOTE)     The eGFR has been calculated using the CKD EPI equation.     This calculation has not been validated in all clinical situations.     eGFR's persistently <90 mL/min signify possible Chronic Kidney     Disease.  TYPE AND SCREEN     Status: None   Collection Time    03/13/13 12:00 PM      Result Value Ref Range   ABO/RH(D) B POS     Antibody Screen NEG     Sample Expiration 03/16/2013    ABO/RH     Status: None   Collection Time    03/13/13 12:00 PM      Result Value Ref Range   ABO/RH(D) B POS    POC OCCULT BLOOD, ED     Status: Abnormal   Collection Time    03/13/13  1:33 PM      Result Value Ref Range   Fecal Occult Bld POSITIVE (*) NEGATIVE  HEMOGLOBIN     Status: Abnormal   Collection Time    03/13/13  6:46 PM      Result Value Ref Range   Hemoglobin 11.3 (*) 13.0 - 17.0 g/dL  HEMOGLOBIN     Status: Abnormal   Collection Time    03/14/13 12:40 AM      Result Value Ref Range   Hemoglobin 10.8 (*) 13.0 - 17.0 g/dL  HEMOGLOBIN     Status: Abnormal   Collection  Time    03/14/13  8:35 AM      Result Value Ref  Range   Hemoglobin 10.9 (*) 13.0 - 17.0 g/dL  HEMOGLOBIN AND HEMATOCRIT, BLOOD     Status: Abnormal   Collection Time    03/14/13  5:41 PM      Result Value Ref Range   Hemoglobin 11.5 (*) 13.0 - 17.0 g/dL   HCT 60.0 (*) 65.9 - 01.9 %  BASIC METABOLIC PANEL     Status: Abnormal   Collection Time    03/15/13  5:52 AM      Result Value Ref Range   Sodium 140  137 - 147 mEq/L   Potassium 3.8  3.7 - 5.3 mEq/L   Chloride 107  96 - 112 mEq/L   CO2 23  19 - 32 mEq/L   Glucose, Bld 94  70 - 99 mg/dL   BUN 10  6 - 23 mg/dL   Creatinine, Ser 1.70  0.50 - 1.35 mg/dL   Calcium 7.9 (*) 8.4 - 10.5 mg/dL   GFR calc non Af Amer 75 (*) >90 mL/min   GFR calc Af Amer 86 (*) >90 mL/min   Comment: (NOTE)     The eGFR has been calculated using the CKD EPI equation.     This calculation has not been validated in all clinical situations.     eGFR's persistently <90 mL/min signify possible Chronic Kidney     Disease.  CBC     Status: Abnormal   Collection Time    03/15/13  5:52 AM      Result Value Ref Range   WBC 7.0  4.0 - 10.5 K/uL   RBC 3.86 (*) 4.22 - 5.81 MIL/uL   Hemoglobin 10.5 (*) 13.0 - 17.0 g/dL   HCT 15.2 (*) 66.7 - 33.8 %   MCV 85.2  78.0 - 100.0 fL   MCH 27.2  26.0 - 34.0 pg   MCHC 31.9  30.0 - 36.0 g/dL   RDW 83.2  62.3 - 56.6 %   Platelets 267  150 - 400 K/uL    Imaging / Studies: Ct Abdomen Pelvis W Contrast  03/14/2013   ADDENDUM REPORT: 03/14/2013 14:22  ADDENDUM: The original report was by Dr. Smith Mince. The following addendum is by Dr. Gaylyn Rong:  I spoke by telephone about this case with Dr. Karie Soda at 2:15 p.m. on 03/13/2013.  He noted that on physical exam the patient has a hard rectal mass. Accordingly I am suspicious that the left perirectal 2.0 x 1.5 cm lesion is actually a metastatic lymph node and also that the enlarging hypodense lesion in the lateral segment left hepatic lobe is probably a metastatic lesion. We discussed this relative likelihood.  There  is a marginally enhancing fluid collection just below the right ischial tuberosity extending towards the skin surface along the right lower buttock which may reflect a decubitus ulcer.   Electronically Signed   By: Herbie Baltimore M.D.   On: 03/14/2013 14:22   03/14/2013   CLINICAL DATA:  Bloody stools for 1 day. History of pneumoperitoneum, C difficile colitis, and previous intra-abdominal abscess.  EXAM: CT ABDOMEN AND PELVIS WITH CONTRAST  TECHNIQUE: Multidetector CT imaging of the abdomen and pelvis was performed using the standard protocol following bolus administration of intravenous contrast.  CONTRAST:  OMNIPAQUE IOHEXOL 300 MG/ML  SOLN  COMPARISON:  CT ABD/PELVIS W CM dated 12/22/2012; DG ABD 2 VIEWS dated 12/18/2012; CT ABD/PELVIS W CM dated  12/15/2012; CT ABD/PELV WO CM dated 12/05/2012; CT ABD/PELVIS W CM dated 12/11/2012  FINDINGS: No lung infiltrates.  No effusion or pneumothorax.  Pneumoperitoneum previously detected has resolved. Left lower quadrant fluid collection noted previously also resolved.  Persistent left perirectal peripherally enhancing fluid collection essentially stable in size when compared most recent priors, measuring 15 x 20 mm cross-section on image 82, extending over a craniocaudal length of 26 mm. It is immediately adjacent to the wall of the rectum. There is moderate surrounding stranding. Residual perirectal abscess is likely, and possibly correlates with bloody stools.  Slight interval growth in a hypoattenuating lesion involving the left lobe of the liver now measuring 25 x 23 mm as compared with 17 x 15 mm on 12 714. Given the previous suspected intraperitoneal inflammatory process, a liver abscess cannot be excluded. Tissue sampling may be warranted. No other similar lesions.  Unremarkable appearing and unchanged spleen, pancreas, kidneys, adrenal glands, and gallbladder. No bowel obstruction. No appendiceal inflammation. No worrisome osseous lesions. Atheromatous  change of the aorta without aneurysmal dilatation. Previously noted air in the bladder has resolved and was likely from previous instrumentation. Mild prostatic calcifications.  IMPRESSION: Pneumoperitoneum and left lower quadrant fluid collection noted previously have resolved.  Slight increased hypoattenuating lesion involving the left lobe of the liver measuring 25 x 23 mm cross-section. Liver abscess not excluded.  Persistent left perirectal peripherally enhancing fluid collection measuring 15 x 20 x 26 mm, likely residual perirectal abscess.  Electronically Signed: By: Rolla Flatten M.D. On: 03/13/2013 18:13    Medications / Allergies: per chart  Antibiotics: Anti-infectives   None       Note: This dictation was prepared with Dragon/digital dictation along with [employer]. Any transcriptional errors that result from this process are unintentional.

## 2013-03-16 NOTE — Op Note (Signed)
[place], [address]   OPERATIVE PROCEDURE REPORT  PATIENT: Victor Little, Victor Little  MR#: [id number] BIRTHDATE: 1945/11/15  GENDER: Male ENDOSCOPIST: Carol Ada, MD ASSISTANT:   William Dalton, technician Elna Breslow, RN PROCEDURE DATE: 03/16/2013 PROCEDURE:   Colonoscopy with biopsy ASA CLASS:   Class II INDICATIONS:Rectal Bleeding. MEDICATIONS: Fentanyl 50 mcg IV and Versed 4 mg IV  DESCRIPTION OF PROCEDURE:   After the risks benefits and alternatives of the procedure were thoroughly explained, informed consent was obtained.  A digital rectal exam revealed a rectal mass.    The     endoscope was introduced through the anus  and advanced to the cecum, which was identified by both the appendix and ileocecal valve , No adverse events experienced.    The quality of the prep was good. .  The instrument was then slowly withdrawn as the colon was fully examined.     FINDINGS: Upon initial entry a large nonobstructing circumfirential ulcerated friable mass was identified.  It started 4 cm from the anal verge and extened porixmally 5-6 cm.  Multiple cold biopsies were obtained and the proximal and distal edges were tattooed with SPOT.  At 25 cm a small ulcerated friable lesions was identified and this was suspected to be malignant.  At 30 cm a bleeding polypoid lesion, likely malignant was biopsied and tattooed.  No other abnormalities were identiffied.  A global image of the cecum was not possible, but excellent segmental views were obtained. The scope was then withdrawn from the patient and the procedure terminated.  COMPLICATIONS: There were no complications.  IMPRESSION: 1) 5-6 cm ulcerated rectal mass. 2) Most likely two metastatic sites versus synchronous cancers at 25 and 30 cm.  RECOMMENDATIONS: 1) Await biopsy results. 2) Oncologic treatment. 3) Given the CT scan findings that he likely has mets to the liver in view of  the current findings, I do not feel a rectal EUS is necessary.  However, if needed it can be performed.  _______________________________ eSigned:  Carol Ada, MD 03/16/2013 9:53 AM   PATIENT NAME:  Victor Little, Victor Little MR#: [id number]

## 2013-03-16 NOTE — Progress Notes (Signed)
Pt drank about 90% of go litly during night.  Kept falling asleep.  Had two med size soft to liquid stools during night. Still not running clear.  Will try and finish off the bottle but pt has not slept much and is becoming harder to wake up.

## 2013-03-16 NOTE — Interval H&P Note (Signed)
History and Physical Interval Note:  03/16/2013 8:46 AM  Victor Little  has presented today for surgery, with the diagnosis of Hematochezia  The various methods of treatment have been discussed with the patient and family. After consideration of risks, benefits and other options for treatment, the patient has consented to  Procedure(s): COLONOSCOPY (N/A) as a surgical intervention .  The patient's history has been reviewed, patient examined, no change in status, stable for surgery.  I have reviewed the patient's chart and labs.  Questions were answered to the patient's satisfaction.     [name] D

## 2013-03-17 ENCOUNTER — Encounter (HOSPITAL_COMMUNITY): Payer: Self-pay | Admitting: Gastroenterology

## 2013-03-17 ENCOUNTER — Telehealth: Payer: Self-pay | Admitting: *Deleted

## 2013-03-17 LAB — BASIC METABOLIC PANEL
BUN: 11 mg/dL (ref 6–23)
CALCIUM: 7.6 mg/dL — AB (ref 8.4–10.5)
CO2: 21 mEq/L (ref 19–32)
CREATININE: 1.12 mg/dL (ref 0.50–1.35)
Chloride: 106 mEq/L (ref 96–112)
GFR calc Af Amer: 77 mL/min — ABNORMAL LOW (ref >90)
GFR, EST NON AFRICAN AMERICAN: 67 mL/min — AB (ref >90)
Glucose, Bld: 96 mg/dL (ref 70–99)
Potassium: 3.7 mEq/L (ref 3.7–5.3)
SODIUM: 139 meq/L (ref 137–147)

## 2013-03-17 LAB — CBC
HCT: 32.2 % — ABNORMAL LOW (ref 39.0–52.0)
Hemoglobin: 10.3 g/dL — ABNORMAL LOW (ref 13.0–17.0)
MCH: 27.1 pg (ref 26.0–34.0)
MCHC: 32 g/dL (ref 30.0–36.0)
MCV: 84.7 fL (ref 78.0–100.0)
Platelets: 256 10*3/uL (ref 150–400)
RBC: 3.8 MIL/uL — AB (ref 4.22–5.81)
RDW: 14.6 % (ref 11.5–15.5)
WBC: 7.5 10*3/uL (ref 4.0–10.5)

## 2013-03-17 NOTE — Progress Notes (Signed)
Patient ID: Victor Little, male   DOB: [birth year], 68 y.o.   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 Pt is s/p colonoscopy with biopsy on 3/1- results pending. Imaging studies were reviewed by Dr. Kathlene Cote. Additional PMH as below. Exam: pt awake, follows few commands. Chest- CTA bilat; heart- RRR; abd- soft,+BS,NT; ext- no edema.  Filed Vitals:   03/16/13 2135 03/17/13 0554 03/17/13 1249 03/17/13 2152  BP: 138/88 120/62 129/80 131/84  Pulse: 90 95 90 92  Temp: 97.3 F (36.3 C) 97.9 F (36.6 C) 98.1 F (36.7 C) 98.2 F (36.8 C)  TempSrc: Oral Oral Oral Oral  Resp: 20 20 18 18   SpO2: 97% 98% 99% 98%   Past Medical History  Diagnosis Date  . Mental retardation   . Organic brain syndrome   . Hypertension   . Deaf   . Clostridium difficile colitis   . Pneumonia   . Cellulitis   . Deaf   . Mutism   . Neuropathy   . Intra-abdominal abscess 12/19/2012  . Pneumoperitoneum of unknown etiology 12/19/2012   Past Surgical History  Procedure Laterality Date  . Orif right patella  09/23/2005  . Removal of right patella hardware  01/26/2006  . Colonoscopy N/A 03/16/2013    Procedure: COLONOSCOPY;  Surgeon: Beryle Beams, MD;  Location: [facility];  Service: Endoscopy;  Laterality: N/A;   Ct Chest W Contrast  03/15/2013   CLINICAL DATA:  Suspected metastatic rectal mass, for staging. Shortness of breath, leukocytosis, history of pneumonia.  EXAM: CT CHEST WITH CONTRAST  TECHNIQUE: Multidetector CT imaging of the chest was performed during intravenous contrast administration.  CONTRAST:  57mL OMNIPAQUE IOHEXOL 300 MG/ML  SOLN  COMPARISON:  CT chest dated 12/11/2012  FINDINGS: Evaluation of the lung parenchyma is constrained by respiratory motion.  Two right upper lobe pulmonary nodules measuring up to 6 mm (series 5/images 14-15). This is significantly improved from the  prior and may reflect residual post infectious/inflammatory scarring.  6 mm left upper lobe nodule (series 5/image 15), grossly unchanged.  6 mm left lower lobe nodular opacity along the fissure (series 5/ image 20), more conspicuous on the current study but likely grossly unchanged.  Multiple tree-in-bud nodular opacities in the left lower lobe, measuring up to 5 mm (series 5/images 40 and 42), new from prior chest CT and possibly infectious/inflammatory.  Trace bilateral pleural effusions.  No pneumothorax.  Visualized thyroid is unremarkable.  The heart is normal in size. No pericardial effusion. Coronary atherosclerosis in the LAD.  Small right paratracheal and subcarinal lymph nodes measuring up to 7-8 mm short axis, within normal limits. No suspicious hilar or axillary lymphadenopathy.  Visualized upper abdomen is unchanged, noting a 3.1 x 2.5 cm hypoenhancing lesion in the central left hepatic lobe (series 2/image 54), suspicious for metastasis.  Degenerative changes of the visualized thoracic spine. Fusion at T9-10.  IMPRESSION: Limited evaluation due to respiratory motion.  Scattered bilateral pulmonary nodules measuring up to 6 mm, as described above. Residual right upper lobe nodularity is improved and may reflect post infectious/inflammatory scarring. Left upper lobe and superior left lower lobe nodularity are unchanged and nonspecific. Left lower lobe tree-in-bud nodularity is new and favored to be infectious/inflammatory.  None of these findings are specific for metastatic disease to the chest. Attention on follow-up is suggested, preferably when patient is better able to breath  hold.   Electronically Signed   By: Julian Hy M.D.   On: 03/15/2013 19:58   Ct Abdomen Pelvis W Contrast  03/14/2013   ADDENDUM REPORT: 03/14/2013 14:22  ADDENDUM: The original report was by Dr. Nyoka Lint. The following addendum is by Dr. Van Clines:  I spoke by telephone about this case with Dr. Michael Boston  at 2:15 p.m. on 03/13/2013.  He noted that on physical exam the patient has a hard rectal mass. Accordingly I am suspicious that the left perirectal 2.0 x 1.5 cm lesion is actually a metastatic lymph node and also that the enlarging hypodense lesion in the lateral segment left hepatic lobe is probably a metastatic lesion. We discussed this relative likelihood.  There is a marginally enhancing fluid collection just below the right ischial tuberosity extending towards the skin surface along the right lower buttock which may reflect a decubitus ulcer.   Electronically Signed   By: Sherryl Barters M.D.   On: 03/14/2013 14:22   03/14/2013   CLINICAL DATA:  Bloody stools for 1 day. History of pneumoperitoneum, C difficile colitis, and previous intra-abdominal abscess.  EXAM: CT ABDOMEN AND PELVIS WITH CONTRAST  TECHNIQUE: Multidetector CT imaging of the abdomen and pelvis was performed using the standard protocol following bolus administration of intravenous contrast.  CONTRAST:  134mL OMNIPAQUE IOHEXOL 300 MG/ML  SOLN  COMPARISON:  CT ABD/PELVIS W CM dated 12/22/2012; DG ABD 2 VIEWS dated 12/18/2012; CT ABD/PELVIS W CM dated 12/15/2012; CT ABD/PELV WO CM dated 12/05/2012; CT ABD/PELVIS W CM dated 12/11/2012  FINDINGS: No lung infiltrates.  No effusion or pneumothorax.  Pneumoperitoneum previously detected has resolved. Left lower quadrant fluid collection noted previously also resolved.  Persistent left perirectal peripherally enhancing fluid collection essentially stable in size when compared most recent priors, measuring 15 x 20 mm cross-section on image 82, extending over a craniocaudal length of 26 mm. It is immediately adjacent to the wall of the rectum. There is moderate surrounding stranding. Residual perirectal abscess is likely, and possibly correlates with bloody stools.  Slight interval growth in a hypoattenuating lesion involving the left lobe of the liver now measuring 25 x 23 mm as compared with 17 x 15 mm  on 12 714. Given the previous suspected intraperitoneal inflammatory process, a liver abscess cannot be excluded. Tissue sampling may be warranted. No other similar lesions.  Unremarkable appearing and unchanged spleen, pancreas, kidneys, adrenal glands, and gallbladder. No bowel obstruction. No appendiceal inflammation. No worrisome osseous lesions. Atheromatous change of the aorta without aneurysmal dilatation. Previously noted air in the bladder has resolved and was likely from previous instrumentation. Mild prostatic calcifications.  IMPRESSION: Pneumoperitoneum and left lower quadrant fluid collection noted previously have resolved.  Slight increased hypoattenuating lesion involving the left lobe of the liver measuring 25 x 23 mm cross-section. Liver abscess not excluded.  Persistent left perirectal peripherally enhancing fluid collection measuring 15 x 20 x 26 mm, likely residual perirectal abscess.  Electronically Signed: By: Rolla Flatten M.D. On: 03/13/2013 18:13  Results for orders placed during the hospital encounter of 03/13/13  CBC WITH DIFFERENTIAL      Result Value Ref Range   WBC 7.8  4.0 - 10.5 K/uL   RBC 4.41  4.22 - 5.81 MIL/uL   Hemoglobin 12.1 (*) 13.0 - 17.0 g/dL   HCT 37.4 (*) 39.0 - 52.0 %   MCV 84.8  78.0 - 100.0 fL   MCH 27.4  26.0 - 34.0 pg   MCHC 32.4  30.0 - 36.0 g/dL   RDW 14.6  11.5 - 15.5 %   Platelets 329  150 - 400 K/uL   Neutrophils Relative % 62  43 - 77 %   Neutro Abs 4.9  1.7 - 7.7 K/uL   Lymphocytes Relative 25  12 - 46 %   Lymphs Abs 2.0  0.7 - 4.0 K/uL   Monocytes Relative 7  3 - 12 %   Monocytes Absolute 0.6  0.1 - 1.0 K/uL   Eosinophils Relative 5  0 - 5 %   Eosinophils Absolute 0.4  0.0 - 0.7 K/uL   Basophils Relative 1  0 - 1 %   Basophils Absolute 0.1  0.0 - 0.1 K/uL  BASIC METABOLIC PANEL      Result Value Ref Range   Sodium 143  137 - 147 mEq/L   Potassium 3.9  3.7 - 5.3 mEq/L   Chloride 103  96 - 112 mEq/L   CO2 25  19 - 32 mEq/L   Glucose,  Bld 94  70 - 99 mg/dL   BUN 13  6 - 23 mg/dL   Creatinine, Ser 1.00  0.50 - 1.35 mg/dL   Calcium 8.7  8.4 - 10.5 mg/dL   GFR calc non Af Amer 76 (*) >90 mL/min   GFR calc Af Amer 89 (*) >90 mL/min  HEMOGLOBIN      Result Value Ref Range   Hemoglobin 11.3 (*) 13.0 - 17.0 g/dL  HEMOGLOBIN      Result Value Ref Range   Hemoglobin 10.8 (*) 13.0 - 17.0 g/dL  HEMOGLOBIN      Result Value Ref Range   Hemoglobin 10.9 (*) 13.0 - 17.0 g/dL  HEMOGLOBIN AND HEMATOCRIT, BLOOD      Result Value Ref Range   Hemoglobin 11.5 (*) 13.0 - 17.0 g/dL   HCT 35.6 (*) 39.0 - 52.0 %  HIV ANTIBODY (ROUTINE TESTING)      Result Value Ref Range   HIV NON REACTIVE  NON REACTIVE  BASIC METABOLIC PANEL      Result Value Ref Range   Sodium 140  137 - 147 mEq/L   Potassium 3.8  3.7 - 5.3 mEq/L   Chloride 107  96 - 112 mEq/L   CO2 23  19 - 32 mEq/L   Glucose, Bld 94  70 - 99 mg/dL   BUN 10  6 - 23 mg/dL   Creatinine, Ser 1.02  0.50 - 1.35 mg/dL   Calcium 7.9 (*) 8.4 - 10.5 mg/dL   GFR calc non Af Amer 75 (*) >90 mL/min   GFR calc Af Amer 86 (*) >90 mL/min  CBC      Result Value Ref Range   WBC 7.0  4.0 - 10.5 K/uL   RBC 3.86 (*) 4.22 - 5.81 MIL/uL   Hemoglobin 10.5 (*) 13.0 - 17.0 g/dL   HCT 32.9 (*) 39.0 - 52.0 %   MCV 85.2  78.0 - 100.0 fL   MCH 27.2  26.0 - 34.0 pg   MCHC 31.9  30.0 - 36.0 g/dL   RDW 14.8  11.5 - 15.5 %   Platelets 267  150 - 400 K/uL  BASIC METABOLIC PANEL      Result Value Ref Range   Sodium 139  137 - 147 mEq/L   Potassium 3.8  3.7 - 5.3 mEq/L   Chloride 104  96 - 112 mEq/L   CO2 23  19 - 32 mEq/L   Glucose, Bld 89  70 - 99 mg/dL   BUN 7  6 - 23 mg/dL   Creatinine, Ser 0.98  0.50 - 1.35 mg/dL   Calcium 7.9 (*) 8.4 - 10.5 mg/dL   GFR calc non Af Amer 84 (*) >90 mL/min   GFR calc Af Amer >90  >90 mL/min  CBC      Result Value Ref Range   WBC 7.1  4.0 - 10.5 K/uL   RBC 4.08 (*) 4.22 - 5.81 MIL/uL   Hemoglobin 11.3 (*) 13.0 - 17.0 g/dL   HCT 34.5 (*) 39.0 - 52.0 %   MCV  84.6  78.0 - 100.0 fL   MCH 27.7  26.0 - 34.0 pg   MCHC 32.8  30.0 - 36.0 g/dL   RDW 14.6  11.5 - 15.5 %   Platelets 310  150 - 400 K/uL  CEA      Result Value Ref Range   CEA 24.6 (*) 0.0 - 5.0 ng/mL  BASIC METABOLIC PANEL      Result Value Ref Range   Sodium 139  137 - 147 mEq/L   Potassium 3.7  3.7 - 5.3 mEq/L   Chloride 106  96 - 112 mEq/L   CO2 21  19 - 32 mEq/L   Glucose, Bld 96  70 - 99 mg/dL   BUN 11  6 - 23 mg/dL   Creatinine, Ser 1.12  0.50 - 1.35 mg/dL   Calcium 7.6 (*) 8.4 - 10.5 mg/dL   GFR calc non Af Amer 67 (*) >90 mL/min   GFR calc Af Amer 77 (*) >90 mL/min  CBC      Result Value Ref Range   WBC 7.5  4.0 - 10.5 K/uL   RBC 3.80 (*) 4.22 - 5.81 MIL/uL   Hemoglobin 10.3 (*) 13.0 - 17.0 g/dL   HCT 32.2 (*) 39.0 - 52.0 %   MCV 84.7  78.0 - 100.0 fL   MCH 27.1  26.0 - 34.0 pg   MCHC 32.0  30.0 - 36.0 g/dL   RDW 14.6  11.5 - 15.5 %   Platelets 256  150 - 400 K/uL  POC OCCULT BLOOD, ED      Result Value Ref Range   Fecal Occult Bld POSITIVE (*) NEGATIVE  TYPE AND SCREEN      Result Value Ref Range   ABO/RH(D) B POS     Antibody Screen NEG     Sample Expiration 03/16/2013    ABO/RH      Result Value Ref Range   ABO/RH(D) B POS     A/P: Pt with hx rectal bleeding, rectal mass/colonic lesions as well as left lobe liver lesion; s/p colonoscopic biopsies 3/1-path pending. Plan is for US guided left lobe liver lesion biopsy on 3/3 .Pt's family currently unavailable for consent. Nurse informed of plans .IR will f/u with family in am for consent.

## 2013-03-17 NOTE — Progress Notes (Signed)
Patient ID: Victor Little, male   DOB: [birth year], 68 y.o.   MRN: [id number] 1 Day Post-Op  Subjective: Pt feels well.  Eating well per RN.  No further bleeding.  Objective: Vital signs in last 24 hours: Temp:  [97.3 F (36.3 C)-98.4 F (36.9 C)] 97.9 F (36.6 C) (03/02 0554) Pulse Rate:  [86-100] 95 (03/02 0554) Resp:  [18-32] 20 (03/02 0554) BP: (118-138)/(62-91) 120/62 mmHg (03/02 0554) SpO2:  [97 %-100 %] 98 % (03/02 0554) Last BM Date: 03/15/13  Intake/Output from previous day: 03/01 0701 - 03/02 0700 In: 370 [P.O.:120; I.V.:250] Out: -  Intake/Output this shift:    PE: Abd: soft, NT, ND, +BS  Lab Results:   Recent Labs  03/16/13 0608 03/17/13 0456  WBC 7.1 7.5  HGB 11.3* 10.3*  HCT 34.5* 32.2*  PLT 310 256   BMET  Recent Labs  03/16/13 0608 03/17/13 0456  NA 139 139  K 3.8 3.7  CL 104 106  CO2 23 21  GLUCOSE 89 96  BUN 7 11  CREATININE 0.98 1.12  CALCIUM 7.9* 7.6*   PT/INR No results found for this basename: LABPROT, INR,  in the last 72 hours CMP     Component Value Date/Time   NA 139 03/17/2013 0456   K 3.7 03/17/2013 0456   CL 106 03/17/2013 0456   CO2 21 03/17/2013 0456   GLUCOSE 96 03/17/2013 0456   BUN 11 03/17/2013 0456   CREATININE 1.12 03/17/2013 0456   CALCIUM 7.6* 03/17/2013 0456   PROT 6.8 12/27/2012 0426   ALBUMIN 2.0* 12/27/2012 0426   AST 22 12/27/2012 0426   ALT 13 12/27/2012 0426   ALKPHOS 107 12/27/2012 0426   BILITOT 0.3 12/27/2012 0426   GFRNONAA 67* 03/17/2013 0456   GFRAA 77* 03/17/2013 0456   Lipase     Component Value Date/Time   LIPASE 48 12/05/2012 0747       Studies/Results: Ct Chest W Contrast  03/15/2013   CLINICAL DATA:  Suspected metastatic rectal mass, for staging. Shortness of breath, leukocytosis, history of pneumonia.  EXAM: CT CHEST WITH CONTRAST  TECHNIQUE: Multidetector CT imaging of the chest was performed during intravenous contrast administration.  CONTRAST:  19mL OMNIPAQUE IOHEXOL 300 MG/ML  SOLN   COMPARISON:  CT chest dated 12/11/2012  FINDINGS: Evaluation of the lung parenchyma is constrained by respiratory motion.  Two right upper lobe pulmonary nodules measuring up to 6 mm (series 5/images 14-15). This is significantly improved from the prior and may reflect residual post infectious/inflammatory scarring.  6 mm left upper lobe nodule (series 5/image 15), grossly unchanged.  6 mm left lower lobe nodular opacity along the fissure (series 5/ image 20), more conspicuous on the current study but likely grossly unchanged.  Multiple tree-in-bud nodular opacities in the left lower lobe, measuring up to 5 mm (series 5/images 40 and 42), new from prior chest CT and possibly infectious/inflammatory.  Trace bilateral pleural effusions.  No pneumothorax.  Visualized thyroid is unremarkable.  The heart is normal in size. No pericardial effusion. Coronary atherosclerosis in the LAD.  Small right paratracheal and subcarinal lymph nodes measuring up to 7-8 mm short axis, within normal limits. No suspicious hilar or axillary lymphadenopathy.  Visualized upper abdomen is unchanged, noting a 3.1 x 2.5 cm hypoenhancing lesion in the central left hepatic lobe (series 2/image 54), suspicious for metastasis.  Degenerative changes of the visualized thoracic spine. Fusion at T9-10.  IMPRESSION: Limited evaluation due to respiratory motion.  Scattered bilateral  pulmonary nodules measuring up to 6 mm, as described above. Residual right upper lobe nodularity is improved and may reflect post infectious/inflammatory scarring. Left upper lobe and superior left lower lobe nodularity are unchanged and nonspecific. Left lower lobe tree-in-bud nodularity is new and favored to be infectious/inflammatory.  None of these findings are specific for metastatic disease to the chest. Attention on follow-up is suggested, preferably when patient is better able to breath hold.   Electronically Signed   By: Julian Hy M.D.   On: 03/15/2013 19:58     Anti-infectives: Anti-infectives   None       Assessment/Plan  1. 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 Mute/deaf 3. MR  Plan: 1. Agree with biopsy of liver if able by IR to determine if this is a met or not. 2. Agree with f/u with Dr. Benay Spice. 3. He may follow up with Dr. Leighton Ruff as needed per oncology.  We will sign off.   LOS: 4 days    Jasier Calabretta E 03/17/2013, 9:33 AM Pager: [phone]

## 2013-03-17 NOTE — Progress Notes (Signed)
General Surgery [facility]  Agree with note from [employer].  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  Probably metastasis in liver.  No operative intervention planned at this time.  Patient may see Dr. Leighton Ruff at [place] office if further surgical care needed or desired.  Will sign off.  Earnstine Regal, MD, [facility] Office: [phone]

## 2013-03-17 NOTE — Progress Notes (Signed)
Clinical Social Work  CSW received a call from [employer] (B. Mardene Sayer) who reports she will evaluate patient tomorrow afternoon. CSW will continue to follow and will assist with DC once pasarr number is received.  [place], [place] [phone]

## 2013-03-17 NOTE — Progress Notes (Signed)
Clinical Social Work  Per MD during progression meeting, patient is medically stable to DC. CSW spoke with [facility]) and reported that patient is ready to DC today. SNF reports they need to speak with supervisor re: admitting without a pasarr. CSW staffed case with director [facility]) who informed CSW to contact [facility] to determine what SNF needs to do in order to obtain pasarr. CSW spoke with [facility] who reports patient has to be in the hospital to complete face-to-face. [facility] reports patient was denied pasarr in the past because they did not feel patient skilled needs but that might change due to recent hospital stay. CSW has left a message with SNF in order to discuss DC plans. CSW updated director on plans as well.  CSW updated MD re: barriers for DC. CSW will continue to follow.  [place], [place] [phone]

## 2013-03-17 NOTE — Progress Notes (Signed)
Called and left message for [name] Victor Little sister about meeting the doctor and interpreter tomorrow at [phone] and that we need consent for the liver biopsy.

## 2013-03-17 NOTE — Telephone Encounter (Signed)
Per Dr. Benay Spice, he plans to do oncology consult on 03/18/13 at 7:15.  Per Dr. Benay Spice, contact patient's nurse and have arrangements made for interpreter services to be available at 7:15 on 03/18/13.  This RN spoke with Cordelia Poche RN re: above.  She will notify the family and interpreter services of planned oncology consult for 03/18/13.  This RN left voice message for SW Sindy Messing re: above.

## 2013-03-17 NOTE — Progress Notes (Addendum)
[facility] PROGRESS NOTE  MOUA RASMUSSON PNT:[id number] DOB: [birth year] DOA: 03/13/2013 PCP: [name], MD  Assessment/Plan  Lower GI bleed due to probable colon cancer, two separate areas identified on colonoscopy today -  CEA 24.6 -  Discussed case with Dr. Benay Spice -  IR to biopsy liver mass in AM -  NPO after MN  -  CT chest neg for obvious met -  I do not see a surgical pathology report pending this evening.  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 Wed.  Needs FTF eval for PASARR.  Thank you to SW for excellent work.    Consultants:  General surgery  Infectious disease  Gastroenterology  IR for biopsy  Procedures:  Colo with bx on 3/1  CT of abdomen and pelvis  CT chest  IR bx of liver mass on 3/3  Antibiotics:  None   HPI/Subjective:  No interpreter present this morning for my visit.  Communicated with gestures to determine patient does not have problems breathing or nausea.  Has pain at IV site.  Decreasing stool frequency  Objective: Filed Vitals:   03/16/13 1414 03/16/13 2135 03/17/13 0554 03/17/13 1249  BP: 118/77 138/88 120/62 129/80  Pulse: 86 90 95 90  Temp: 98.4 F (36.9 C) 97.3 F (36.3 C) 97.9 F (36.6 C) 98.1 F (36.7 C)  TempSrc: Oral Oral Oral Oral  Resp: $Remo'20 20 20 18  'yfawi$ SpO2: 97% 97% 98% 99%   No intake or output data in the 24 hours ending 03/17/13 1808 There were no vitals filed for this visit.  Exam:   General:  Caucasian male, No acute  distress  HEENT:  NCAT, MMM  Cardiovascular:  RRR, nl S1, S2 no mrg, 2+ pulses, warm extremities  Respiratory:  CTAB, no increased WOB  Abdomen:   NABS, soft, NT/ND  MSK:   Normal tone and bulk, no LEE  Neuro:  Grossly intact  Data Reviewed: Basic Metabolic Panel:  Recent Labs Lab 03/13/13 1200 03/15/13 0552 03/16/13 0608 03/17/13 0456  NA 143 140 139 139  K 3.9 3.8 3.8 3.7  CL 103 107 104 106  CO2 $Re'25 23 23 21  'ZNy$ GLUCOSE 94 94 89 96  BUN $Re'13 10 7 11  'kvg$ CREATININE 1.00 1.02 0.98 1.12  CALCIUM 8.7 7.9* 7.9* 7.6*   Liver Function Tests: No results found for this basename: AST, ALT, ALKPHOS, BILITOT, PROT, ALBUMIN,  in the last 168 hours No results found for this basename: LIPASE, AMYLASE,  in the last 168 hours No results found for this basename: AMMONIA,  in the last 168 hours CBC:  Recent Labs Lab 03/13/13 1200  03/14/13 0835 03/14/13 1741 03/15/13 0552 03/16/13 0608 03/17/13 0456  WBC 7.8  --   --   --  7.0 7.1 7.5  NEUTROABS 4.9  --   --   --   --   --   --   HGB 12.1*  < > 10.9* 11.5* 10.5* 11.3* 10.3*  HCT 37.4*  --   --  35.6* 32.9* 34.5* 32.2*  MCV  84.8  --   --   --  85.2 84.6 84.7  PLT 329  --   --   --  267 310 256  < > = values in this interval not displayed. Cardiac Enzymes: No results found for this basename: CKTOTAL, CKMB, CKMBINDEX, TROPONINI,  in the last 168 hours BNP (last 3 results)  Recent Labs  12/07/12 0335 12/18/12 0300  PROBNP 680.8* 53.8   CBG: No results found for this basename: GLUCAP,  in the last 168 hours  No results found for this or any previous visit (from the past 240 hour(s)).   Studies: Ct Chest W Contrast  03/15/2013   CLINICAL DATA:  Suspected metastatic rectal mass, for staging. Shortness of breath, leukocytosis, history of pneumonia.  EXAM: CT CHEST WITH CONTRAST  TECHNIQUE: Multidetector CT imaging of the chest was performed during intravenous contrast administration.  CONTRAST:  70mL OMNIPAQUE IOHEXOL 300 MG/ML   SOLN  COMPARISON:  CT chest dated 12/11/2012  FINDINGS: Evaluation of the lung parenchyma is constrained by respiratory motion.  Two right upper lobe pulmonary nodules measuring up to 6 mm (series 5/images 14-15). This is significantly improved from the prior and may reflect residual post infectious/inflammatory scarring.  6 mm left upper lobe nodule (series 5/image 15), grossly unchanged.  6 mm left lower lobe nodular opacity along the fissure (series 5/ image 20), more conspicuous on the current study but likely grossly unchanged.  Multiple tree-in-bud nodular opacities in the left lower lobe, measuring up to 5 mm (series 5/images 40 and 42), new from prior chest CT and possibly infectious/inflammatory.  Trace bilateral pleural effusions.  No pneumothorax.  Visualized thyroid is unremarkable.  The heart is normal in size. No pericardial effusion. Coronary atherosclerosis in the LAD.  Small right paratracheal and subcarinal lymph nodes measuring up to 7-8 mm Shomari Scicchitano axis, within normal limits. No suspicious hilar or axillary lymphadenopathy.  Visualized upper abdomen is unchanged, noting a 3.1 x 2.5 cm hypoenhancing lesion in the central left hepatic lobe (series 2/image 54), suspicious for metastasis.  Degenerative changes of the visualized thoracic spine. Fusion at T9-10.  IMPRESSION: Limited evaluation due to respiratory motion.  Scattered bilateral pulmonary nodules measuring up to 6 mm, as described above. Residual right upper lobe nodularity is improved and may reflect post infectious/inflammatory scarring. Left upper lobe and superior left lower lobe nodularity are unchanged and nonspecific. Left lower lobe tree-in-bud nodularity is new and favored to be infectious/inflammatory.  None of these findings are specific for metastatic disease to the chest. Attention on follow-up is suggested, preferably when patient is better able to breath hold.   Electronically Signed   By: Charline Bills M.D.   On: 03/15/2013  19:58    Scheduled Meds: . gabapentin  300 mg Oral TID  . loratadine  10 mg Oral Daily  . metoprolol tartrate  25 mg Oral Daily  . mirtazapine  7.5 mg Oral QHS  . multivitamin with minerals  1 tablet Oral Daily  . pantoprazole (PROTONIX) IV  40 mg Intravenous Q24H  . saccharomyces boulardii  250 mg Oral BID   Continuous Infusions:    Principal Problem:   Rectal mass, probable cancer Active Problems:   Organic brain syndrome (chronic)   Deaf   HTN (hypertension)   Neuropathy   Mute   Lower GI bleed   Liver mass, left lobe, probable metastasis    Time spent: 30 min    Anvay Tennis  [facility] Pager [phone]. If 7PM-7AM,  please contact night-coverage at [email or web address], password [facility] 03/17/2013, 6:08 PM  LOS: 4 days

## 2013-03-18 DIAGNOSIS — H913 Deaf nonspeaking, not elsewhere classified: Secondary | ICD-10-CM

## 2013-03-18 DIAGNOSIS — R599 Enlarged lymph nodes, unspecified: Secondary | ICD-10-CM

## 2013-03-18 DIAGNOSIS — C20 Malignant neoplasm of rectum: Principal | ICD-10-CM

## 2013-03-18 DIAGNOSIS — F79 Unspecified intellectual disabilities: Secondary | ICD-10-CM

## 2013-03-18 DIAGNOSIS — D509 Iron deficiency anemia, unspecified: Secondary | ICD-10-CM

## 2013-03-18 DIAGNOSIS — C787 Secondary malignant neoplasm of liver and intrahepatic bile duct: Secondary | ICD-10-CM

## 2013-03-18 LAB — CBC
HEMATOCRIT: 34.4 % — AB (ref 39.0–52.0)
Hemoglobin: 11.1 g/dL — ABNORMAL LOW (ref 13.0–17.0)
MCH: 27.2 pg (ref 26.0–34.0)
MCHC: 32.3 g/dL (ref 30.0–36.0)
MCV: 84.3 fL (ref 78.0–100.0)
PLATELETS: 280 10*3/uL (ref 150–400)
RBC: 4.08 MIL/uL — ABNORMAL LOW (ref 4.22–5.81)
RDW: 14.4 % (ref 11.5–15.5)
WBC: 7.9 10*3/uL (ref 4.0–10.5)

## 2013-03-18 LAB — PROTIME-INR
INR: 1.04 (ref 0.00–1.49)
Prothrombin Time: 13.4 seconds (ref 11.6–15.2)

## 2013-03-18 LAB — BASIC METABOLIC PANEL
BUN: 9 mg/dL (ref 6–23)
CALCIUM: 7.7 mg/dL — AB (ref 8.4–10.5)
CHLORIDE: 102 meq/L (ref 96–112)
CO2: 25 meq/L (ref 19–32)
Creatinine, Ser: 0.94 mg/dL (ref 0.50–1.35)
GFR calc Af Amer: >90 mL/min (ref >90)
GFR calc non Af Amer: 85 mL/min — ABNORMAL LOW (ref >90)
Glucose, Bld: 101 mg/dL — ABNORMAL HIGH (ref 70–99)
Potassium: 3.5 mEq/L — ABNORMAL LOW (ref 3.7–5.3)
SODIUM: 139 meq/L (ref 137–147)

## 2013-03-18 LAB — APTT: aPTT: 30 seconds (ref 24–37)

## 2013-03-18 MED ORDER — ENSURE COMPLETE PO LIQD
237.0000 mL | Freq: Two times a day (BID) | ORAL | Status: DC
  Administered 2013-03-19 – 2013-03-20 (×4): 237 mL via ORAL

## 2013-03-18 MED ORDER — FERROUS SULFATE 325 (65 FE) MG PO TABS
325.0000 mg | ORAL_TABLET | Freq: Every day | ORAL | Status: DC
  Administered 2013-03-19 – 2013-03-20 (×2): 325 mg via ORAL
  Filled 2013-03-18 (×3): qty 1

## 2013-03-18 NOTE — Progress Notes (Addendum)
Contacted pt sister [name] to discuss liver biopsy. She was not aware he had liver lesion and was not aware that biopsy was requested. I discussed indication for liver biopsy. Explained procedure, risks, complications, use of sedation. Explained that lesion location will make for a technically challenging case and may end up not being accessible. Labs reviewed.  Consent obtained from sister over phone.  Ascencion Dike PA-C Interventional Radiology 03/18/2013 9:19 AM   Further evaluation by Oncology has determined pt not a good candidate for surgery or evwen aggressive therapy. Therefore decision has been made to forego biopsy. Procedure has been cancelled. D/W Dr. Arcola Jansky PA-C Interventional Radiology 03/18/2013 9:35 AM

## 2013-03-18 NOTE — Consult Note (Signed)
New Hematology/Oncology Consult   Referral [id number]     Reason for Referral: Colon cancer   HPI: (The history is largely obtained from the medical chart as the patient is unable to give a significant history despite the presence of a sign language interpreter)   He was admitted 12/05/2012 with C. difficile colitis and sepsis. He developed respiratory failure in the hospital and required intubation. He also developed pneumoperitoneum and an abdominal abscess. He was discharged to a skilled nursing facility on 12/27/2012.  Mr. Victor Little was readmitted on 03/13/2013 with rectal bleeding. A CT of the abdomen and pelvis revealed resolution of the pneumoperitoneum and left lower quadrant fluid collection. An increase in a hypodense lesion in the left liver was noted. A persistent left perirectal peripherally enhancing fluid collection versus lymph node was noted.  Dr. Benson Norway was consult at he was taken to a colonoscopy on 03/16/2013. A rectal mass was noted beginning 4 cm from the anal verge. Multiple biopsies were obtained. A small ulcerated lesion was noted at 25 cm and at 30 cm a bleeding polypoid lesion was also noted. The pathology [id number]) revealed multiple fragments of a tubular adenoma at the 30 cm biopsy. 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  Mr. Victor Little has no specific complaint. He confirms rectal bleeding.    Past Medical History  Diagnosis Date  . Mental retardation   .  rectal cancer   03/16/2048  . Hypertension   . Deaf   . Clostridium difficile colitis  November 2014   . Pneumonia  November 2014   . Cellulitis   . Deaf   . Mutism   . Neuropathy   . Intra-abdominal abscess 12/19/2012  . Pneumoperitoneum of unknown etiology 12/19/2012  :  Past Surgical History  Procedure Laterality Date  . Orif right patella  09/23/2005  . Removal of right patella hardware  01/26/2006  . Colonoscopy N/A 03/16/2013    Procedure: COLONOSCOPY;  Surgeon: Beryle Beams, MD;  Location: [facility];  Service: Endoscopy;  Laterality: N/A;  :  Current facility-administered medications:acetaminophen (TYLENOL) suppository 650 mg, 650 mg, Rectal, Q6H PRN, Venetia Maxon Rama, MD;  acetaminophen (TYLENOL) tablet 650 mg, 650 mg, Oral, Q6H PRN, Christina P Rama, MD;  albuterol (PROVENTIL) (2.5 MG/3ML) 0.083% nebulizer solution 2.5 mg, 2.5 mg, Nebulization, Q2H PRN, Venetia Maxon Rama, MD [START ON 03/19/2013] feeding supplement (ENSURE COMPLETE) (ENSURE COMPLETE) liquid 237 mL, 237 mL, Oral, BID BM, Janece Canterbury, MD;  Derrill Memo ON 03/19/2013] ferrous sulfate tablet 325 mg, 325 mg, Oral, QPC breakfast, Janece Canterbury, MD;  gabapentin (NEURONTIN) capsule 300 mg, 300 mg, Oral, TID, Venetia Maxon Rama, MD, 300 mg at 03/18/13 1628;  loratadine (CLARITIN) tablet 10 mg, 10 mg, Oral, Daily, Venetia Maxon Rama, MD, 10 mg at 03/18/13 0934 metoprolol tartrate (LOPRESSOR) tablet 25 mg, 25 mg, Oral, Daily, Venetia Maxon Rama, MD, 25 mg at 03/18/13 0934;  mirtazapine (REMERON) tablet 7.5 mg, 7.5 mg, Oral, QHS, Christina P Rama, MD, 7.5 mg at 03/17/13 2222;  multivitamin with minerals tablet 1 tablet, 1 tablet, Oral, Daily, Venetia Maxon Rama, MD, 1 tablet at 03/18/13 0934;  ondansetron (ZOFRAN) injection 4 mg, 4 mg, Intravenous, Q6H PRN, Christina P Rama, MD ondansetron (ZOFRAN) tablet 4 mg, 4 mg, Oral, Q6H PRN, Venetia Maxon Rama, MD;  saccharomyces boulardii (FLORASTOR) capsule 250 mg, 250 mg, Oral, BID, Venetia Maxon Rama, MD, 250 mg at 03/18/13 0934;  traMADol (ULTRAM) tablet 100 mg, 100 mg, Oral, Q6H PRN, Jerrye Bushy Reidler,  PA-C:  . Derrill Memo ON 03/19/2013] feeding supplement (ENSURE COMPLETE)  237 mL Oral BID BM  . [START ON 03/19/2013] ferrous sulfate  325 mg Oral QPC breakfast  . gabapentin  300 mg Oral TID  . loratadine  10 mg Oral Daily  . metoprolol tartrate  25 mg Oral Daily  . mirtazapine  7.5 mg Oral QHS  . multivitamin with minerals  1 tablet Oral Daily  . saccharomyces boulardii  250 mg Oral  BID    family history: Unable to obtain  Social history: He lives in a skilled nursing facility .  Review of Systems:  Positives include: Rectal bleeding  A complete ROS was otherwise negative.   Physical Exam:  Blood pressure 120/68, pulse 93, temperature 98.4 F (36.9 C), temperature source Oral, resp. rate 20, SpO2 100.00%.  HEENT: Neck without mass Lungs: Clear bilaterally Cardiac: Regular rate and rhythm Abdomen: No hepatosplenomegaly, nontender, no mass  Vascular: No leg edema Lymph nodes: No cervical, supraclavicular, axillary, or inguinal nodes Neurologic: Alert, not oriented, follows simple commands, moves all extremities Skin: No rash   LABS:   Recent Labs  03/17/13 0456 03/18/13 0525  WBC 7.5 7.9  HGB 10.3* 11.1*  HCT 32.2* 34.4*  PLT 256 280     Recent Labs  03/17/13 0456 03/18/13 0525  NA 139 139  K 3.7 3.5*  CL 106 102  CO2 21 25  GLUCOSE 96 101*  BUN 11 9  CREATININE 1.12 0.94  CALCIUM 7.6* 7.7*    CEA 24.6   RADIOLOGY:  Ct Chest W Contrast  03/15/2013   CLINICAL DATA:  Suspected metastatic rectal mass, for staging. Shortness of breath, leukocytosis, history of pneumonia.  EXAM: CT CHEST WITH CONTRAST  TECHNIQUE: Multidetector CT imaging of the chest was performed during intravenous contrast administration.  CONTRAST:  59mL OMNIPAQUE IOHEXOL 300 MG/ML  SOLN  COMPARISON:  CT chest dated 12/11/2012  FINDINGS: Evaluation of the lung parenchyma is constrained by respiratory motion.  Two right upper lobe pulmonary nodules measuring up to 6 mm (series 5/images 14-15). This is significantly improved from the prior and may reflect residual post infectious/inflammatory scarring.  6 mm left upper lobe nodule (series 5/image 15), grossly unchanged.  6 mm left lower lobe nodular opacity along the fissure (series 5/ image 20), more conspicuous on the current study but likely grossly unchanged.  Multiple tree-in-bud nodular opacities in the left lower  lobe, measuring up to 5 mm (series 5/images 40 and 42), new from prior chest CT and possibly infectious/inflammatory.  Trace bilateral pleural effusions.  No pneumothorax.  Visualized thyroid is unremarkable.  The heart is normal in size. No pericardial effusion. Coronary atherosclerosis in the LAD.  Small right paratracheal and subcarinal lymph nodes measuring up to 7-8 mm short axis, within normal limits. No suspicious hilar or axillary lymphadenopathy.  Visualized upper abdomen is unchanged, noting a 3.1 x 2.5 cm hypoenhancing lesion in the central left hepatic lobe (series 2/image 54), suspicious for metastasis.  Degenerative changes of the visualized thoracic spine. Fusion at T9-10.  IMPRESSION: Limited evaluation due to respiratory motion.  Scattered bilateral pulmonary nodules measuring up to 6 mm, as described above. Residual right upper lobe nodularity is improved and may reflect post infectious/inflammatory scarring. Left upper lobe and superior left lower lobe nodularity are unchanged and nonspecific. Left lower lobe tree-in-bud nodularity is new and favored to be infectious/inflammatory.  None of these findings are specific for metastatic disease to the chest. Attention on follow-up is suggested,  preferably when patient is better able to breath hold.   Electronically Signed   By: Julian Hy M.D.   On: 03/15/2013 19:58   Ct Abdomen Pelvis W Contrast  03/14/2013   ADDENDUM REPORT: 03/14/2013 14:22  ADDENDUM: The original report was by Dr. Nyoka Lint. The following addendum is by Dr. Van Clines:  I spoke by telephone about this case with Dr. Michael Boston at 2:15 p.m. on 03/13/2013.  He noted that on physical exam the patient has a hard rectal mass. Accordingly I am suspicious that the left perirectal 2.0 x 1.5 cm lesion is actually a metastatic lymph node and also that the enlarging hypodense lesion in the lateral segment left hepatic lobe is probably a metastatic lesion. We discussed this  relative likelihood.  There is a marginally enhancing fluid collection just below the right ischial tuberosity extending towards the skin surface along the right lower buttock which may reflect a decubitus ulcer.   Electronically Signed   By: Sherryl Barters M.D.   On: 03/14/2013 14:22   03/14/2013   CLINICAL DATA:  Bloody stools for 1 day. History of pneumoperitoneum, C difficile colitis, and previous intra-abdominal abscess.  EXAM: CT ABDOMEN AND PELVIS WITH CONTRAST  TECHNIQUE: Multidetector CT imaging of the abdomen and pelvis was performed using the standard protocol following bolus administration of intravenous contrast.  CONTRAST:  18mL OMNIPAQUE IOHEXOL 300 MG/ML  SOLN  COMPARISON:  CT ABD/PELVIS W CM dated 12/22/2012; DG ABD 2 VIEWS dated 12/18/2012; CT ABD/PELVIS W CM dated 12/15/2012; CT ABD/PELV WO CM dated 12/05/2012; CT ABD/PELVIS W CM dated 12/11/2012  FINDINGS: No lung infiltrates.  No effusion or pneumothorax.  Pneumoperitoneum previously detected has resolved. Left lower quadrant fluid collection noted previously also resolved.  Persistent left perirectal peripherally enhancing fluid collection essentially stable in size when compared most recent priors, measuring 15 x 20 mm cross-section on image 82, extending over a craniocaudal length of 26 mm. It is immediately adjacent to the wall of the rectum. There is moderate surrounding stranding. Residual perirectal abscess is likely, and possibly correlates with bloody stools.  Slight interval growth in a hypoattenuating lesion involving the left lobe of the liver now measuring 25 x 23 mm as compared with 17 x 15 mm on 12 714. Given the previous suspected intraperitoneal inflammatory process, a liver abscess cannot be excluded. Tissue sampling may be warranted. No other similar lesions.  Unremarkable appearing and unchanged spleen, pancreas, kidneys, adrenal glands, and gallbladder. No bowel obstruction. No appendiceal inflammation. No worrisome  osseous lesions. Atheromatous change of the aorta without aneurysmal dilatation. Previously noted air in the bladder has resolved and was likely from previous instrumentation. Mild prostatic calcifications.  IMPRESSION: Pneumoperitoneum and left lower quadrant fluid collection noted previously have resolved.  Slight increased hypoattenuating lesion involving the left lobe of the liver measuring 25 x 23 mm cross-section. Liver abscess not excluded.  Persistent left perirectal peripherally enhancing fluid collection measuring 15 x 20 x 26 mm, likely residual perirectal abscess.  Electronically Signed: By: Rolla Flatten M.D. On: 03/13/2013 18:13   Assessment and Plan:   1.Rectal cancer  CT scan evidence of metastatic pelvic lymphadenopathy and a liver metastasis 2.  2 additional left colon masses on the colonoscopy 03/16/2013, biopsy of the mass at 30 cm confirmed a tubular adenoma  3. Deaf/mute  4. Mental retardation  5.Rectal bleeding secondary to #1   I interviewed and examined Mr. Victor Little with a sign language interpreter. He appears to have difficulty  understanding the diagnosis.I feel it would be very difficult for him to undergo resection of the rectal tumor or systemic chemotherapy. He may be a candidate for palliative radiation to the rectum.We could consider concurrent 5-fluorouracil based chemotherapy if he were in a closely monitored setting. I will be available to discuss options with his family when they are available.  Recommendations: 1. Radiation oncology consult 2. Outpatient followup at the [facility], MD 03/18/2013, 6:19 PM

## 2013-03-18 NOTE — Progress Notes (Signed)
Clinical Social Work  CSW placed all clinicals in [employer] for [employer]. CSW updated RN that face-to-face evaluation would occur today. CSW will continue to follow and will assist with DC back to SNF once pasarr number is received.  [place], [place] [phone]

## 2013-03-18 NOTE — Progress Notes (Addendum)
[facility] PROGRESS NOTE  Victor Little ZSW:[id number] DOB: 25-Dec-1945 DOA: 03/13/2013 PCP: [name], MD  Assessment/Plan  Lower GI bleed due to probable colon cancer, two separate areas identified on colonoscopy, however only mass closest to rectum demonstrated adenocarcinoma.   -  CEA 24.6 -  Appreciate Dr. Michail Sermon and Dr. 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  Awaiting PASARR.  Thank you to SW for excellent work.    Consultants:  General surgery  Infectious disease  Gastroenterology   Procedures:  Colo with bx on 3/1  CT of abdomen and pelvis  CT chest  Antibiotics:  None   HPI/Subjective:  Denies difficulty breathing, chest pain, abdominal pain. Decreasing stool frequency.  Per nursing, no blood in stools   Objective: Filed Vitals:   03/17/13 1249 03/17/13 2152 03/18/13 0521 03/18/13 1443  BP: 129/80 131/84 128/72 120/68  Pulse: 90 92 96 93  Temp: 98.1 F (36.7 C) 98.2 F (36.8 C) 97.9 F (36.6 C) 98.4 F (36.9 C)  TempSrc: Oral Oral Oral Oral  Resp: $Remo'18 18 18 20  'GHDVN$ SpO2: 99% 98% 96% 100%    Intake/Output Summary (Last 24 hours) at 03/18/13 1810 Last data filed at 03/18/13 1446  Gross per 24 hour  Intake    240 ml  Output   2000 ml  Net  -1760 ml   There were no vitals filed for this visit.  Exam:   General:  Caucasian male, No acute distress,  stable from prior  HEENT:  NCAT, MMM  Cardiovascular:  RRR, nl S1, S2 no mrg, 2+ pulses, warm extremities  Respiratory:  CTAB, no increased WOB  Abdomen:   NABS, soft, NT/ND  MSK:   Normal tone and bulk, no LEE  Neuro:  Grossly intact  Data Reviewed: Basic Metabolic Panel:  Recent Labs Lab 03/13/13 1200 03/15/13 0552 03/16/13 0608 03/17/13 0456 03/18/13 0525  NA 143 140 139 139 139  K 3.9 3.8 3.8 3.7 3.5*  CL 103 107 104 106 102  CO2 $Re'25 23 23 21 25  'uFl$ GLUCOSE 94 94 89 96 101*  BUN $Re'13 10 7 11 9  'JKf$ CREATININE 1.00 1.02 0.98 1.12 0.94  CALCIUM 8.7 7.9* 7.9* 7.6* 7.7*   Liver Function Tests: No results found for this basename: AST, ALT, ALKPHOS, BILITOT, PROT, ALBUMIN,  in the last 168 hours No results found for this basename: LIPASE, AMYLASE,  in the last 168 hours No results found for this basename: AMMONIA,  in the last 168 hours CBC:  Recent Labs Lab 03/13/13 1200  03/14/13 1741 03/15/13 0552 03/16/13 0608 03/17/13 0456 03/18/13 0525  WBC 7.8  --   --  7.0 7.1 7.5 7.9  NEUTROABS 4.9  --   --   --   --   --   --   HGB 12.1*  < > 11.5* 10.5* 11.3* 10.3* 11.1*  HCT 37.4*  --  35.6* 32.9*  34.5* 32.2* 34.4*  MCV 84.8  --   --  85.2 84.6 84.7 84.3  PLT 329  --   --  267 310 256 280  < > = values in this interval not displayed. Cardiac Enzymes: No results found for this basename: CKTOTAL, CKMB, CKMBINDEX, TROPONINI,  in the last 168 hours BNP (last 3 results)  Recent Labs  12/07/12 0335 12/18/12 0300  PROBNP 680.8* 53.8   CBG: No results found for this basename: GLUCAP,  in the last 168 hours  No results found for this or any previous visit (from the past 240 hour(s)).   Studies: No results found.  Scheduled Meds: . gabapentin  300 mg Oral TID  . loratadine  10 mg Oral Daily  . metoprolol tartrate  25 mg Oral Daily  . mirtazapine  7.5 mg Oral QHS  . multivitamin with minerals  1 tablet Oral Daily  . pantoprazole (PROTONIX) IV  40 mg Intravenous Q24H   . saccharomyces boulardii  250 mg Oral BID   Continuous Infusions:    Principal Problem:   Rectal mass, probable cancer Active Problems:   Organic brain syndrome (chronic)   Deaf   HTN (hypertension)   Neuropathy   Mute   Lower GI bleed   Liver mass, left lobe, probable metastasis    Time spent: 30 min    Andranik Jeune, [facility] Pager [phone]. If 7PM-7AM, please contact night-coverage at [email or web address], password [facility] 03/18/2013, 6:10 PM  LOS: 5 days

## 2013-03-18 NOTE — Progress Notes (Signed)
The patient's biopsies of the rectal mass are confirmatory for a rectal adenocarcinoma, however, the polypoid lesion at 30 cm was noted to be an adenoma.  I had originally thought it was malignant as it was very friable.  It may still be the case, but the current biopsies only support a rectal adenocarcinoma.

## 2013-03-19 DIAGNOSIS — D509 Iron deficiency anemia, unspecified: Secondary | ICD-10-CM

## 2013-03-19 LAB — BASIC METABOLIC PANEL
BUN: 12 mg/dL (ref 6–23)
CALCIUM: 8 mg/dL — AB (ref 8.4–10.5)
CHLORIDE: 105 meq/L (ref 96–112)
CO2: 25 meq/L (ref 19–32)
CREATININE: 1.15 mg/dL (ref 0.50–1.35)
GFR calc Af Amer: 75 mL/min — ABNORMAL LOW (ref >90)
GFR calc non Af Amer: 65 mL/min — ABNORMAL LOW (ref >90)
GLUCOSE: 96 mg/dL (ref 70–99)
Potassium: 3.8 mEq/L (ref 3.7–5.3)
Sodium: 142 mEq/L (ref 137–147)

## 2013-03-19 LAB — CBC
HCT: 33.8 % — ABNORMAL LOW (ref 39.0–52.0)
Hemoglobin: 10.8 g/dL — ABNORMAL LOW (ref 13.0–17.0)
MCH: 26.9 pg (ref 26.0–34.0)
MCHC: 32 g/dL (ref 30.0–36.0)
MCV: 84.1 fL (ref 78.0–100.0)
PLATELETS: 275 10*3/uL (ref 150–400)
RBC: 4.02 MIL/uL — ABNORMAL LOW (ref 4.22–5.81)
RDW: 14.6 % (ref 11.5–15.5)
WBC: 9.8 10*3/uL (ref 4.0–10.5)

## 2013-03-19 NOTE — Progress Notes (Addendum)
Clinical Social Work  CSW spoke with Dorian Pod and Raquel from SNF re: talking with Scientist, physiological and was informed that administrator Geroge Baseman) has called out sick today. Admission's coordinators report that administrator is refusing to accept patient back with 30 day pasarr and will only accept if Level B (a one year pasarr) is received. CSW explained that [facility] makes decision for pasarr and CSW cannot change the level. Please note that after looking up patient's previous pasarr's, SNF originally accepted patient in December with a 30 day passar. CSW has left voicemail for administrator with no return call. CSW called and left a message with director [facility]) for further guidance. CSW will continue to follow.  [place], [place] [phone]  Addendum: 1500 Director instructed CSW to contact liaison Dorian Pod) and to get SNF to contact other managers willing to accept patient back to SNF. Liaison reports she will call manager and call CSW back with response. CSW updated bedside RN and charge RN on barriers.  Addendum: 1700 CSW called liaison Dorian Pod) and left a message re: plans for DC. CSW was informed by CM that medical director (Dr. Reynaldo Minium) suggested that [place] and director contact state to make a report against SNF. CSW left a message with director as well and will continue to follow.

## 2013-03-19 NOTE — Progress Notes (Signed)
Clinical Social Work  CSW called to SNF to speak with administrator since CSW has not received a return call yet. SNF reports that administrator is out of office and should be back around 1300. SNF reports they will ask administrator to contact CSW as soon as possible.  CSW continues to follow.  [place], [place] [phone]

## 2013-03-19 NOTE — Discharge Summary (Signed)
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  History of present illness:  68 y.o. male with a PMH of organic brain syndrome with deafness/muteness, prior SBO, prior pneumoperitoneum 12/14, clostridium difficile colitis, not on chronic ASA, who was sent from [facility] with a 24 hour history of bloody stools  Hospital Course:  1. Lower GI bleed due to probable colon cancer, two separate areas identified on colonoscopy, however only mass closest to rectum demonstrated adenocarcinoma.   -no s/s of new bleeding  -  CEA 24.6 -  Appreciate Dr. Michail Sermon and Dr. Benay Spice assistance; outpatient follow up to schedule chemotherapy  -  CT chest neg for obvious met -  Per notes, plan for palliative XRT to be arranged as outpatient  2. 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 Hypertension, blood pressure within normal limits -  Continue holding blood pressure medications except for metoprolol  4. Neuropathy, stable, continue Neurontin  5. Iron deficiency anemia -  Continue ferrous sulfate   Procedures:  Biopsy  (i.e. Studies not automatically included, echos, thoracentesis, etc; not x-rays)  Consultations: Oncology GI  Discharge Exam: Filed Vitals:   03/19/13 0610  BP: 120/78   Pulse: 77  Temp: 98.7 F (37.1 C)  Resp: 18    General: alert Cardiovascular: s1,s2 rrr Respiratory: CTA B  Discharge Instructions  Discharge Orders   Future Orders Complete By Expires   Diet - low sodium heart healthy  As directed    Discharge instructions  As directed    Comments:     Please follow up with oncology clinic in 1-2 weeks   Increase activity slowly  As directed        Medication List         albuterol (5 MG/ML) 0.5% nebulizer solution  Commonly known as:  PROVENTIL  Take 0.5 mLs (2.5 mg total) by nebulization every 2 (two) hours as needed for wheezing or shortness of breath.     benazepril 5 MG tablet  Commonly known as:  LOTENSIN  Take 5 mg by mouth daily.     feeding supplement (ENSURE) Pudg  Take 1 Container by mouth daily.     ferrous sulfate 325 (65 FE) MG tablet  Take 325 mg by mouth daily with breakfast.     furosemide 20 MG tablet  Commonly known as:  LASIX  Take 20 mg by mouth daily.     gabapentin 300 MG capsule  Commonly known as:  NEURONTIN  Take 1 capsule by mouth 3 (three) times daily.     loratadine 10 MG tablet  Commonly known as:  CLARITIN  Take 10 mg by mouth daily.     metoprolol tartrate 25 MG tablet  Commonly known as:  LOPRESSOR  Take 25 mg by mouth daily.  mirtazapine 7.5 MG tablet  Commonly known as:  REMERON  Take 7.5 mg by mouth at bedtime.     multivitamin with minerals Tabs tablet  Take 1 tablet by mouth daily.     PRESCRIPTION MEDICATION  Take 120 mLs by mouth 3 (three) times daily. Med Plus     PROBIOTIC FORMULA PO  Take 1 capsule by mouth daily.     traMADol 50 MG tablet  Commonly known as:  ULTRAM  Take 50 mg by mouth every 6 (six) hours as needed.       No Known Allergies     Follow-up Information   Follow up with Rosario Adie., MD. (As needed per oncology)    Specialty:  General Surgery   Contact information:   [place] [place] [address] [phone]        Follow up with Betsy Coder, MD. Schedule an appointment as soon as possible for a visit in 1 week.   Specialty:  Oncology   Contact information:   [place] [address] [phone]       Follow up with [name], MD In 2 weeks.   Specialty:  Internal Medicine   Contact information:   [address] [place] [address] [place] [address] [phone]        The results of significant diagnostics from this hospitalization (including imaging, microbiology, ancillary and laboratory) are listed below for reference.    Significant Diagnostic Studies: Ct Chest W Contrast  03/15/2013   CLINICAL DATA:  Suspected metastatic rectal mass, for staging. Shortness of breath, leukocytosis, history of pneumonia.  EXAM: CT CHEST WITH CONTRAST  TECHNIQUE: Multidetector CT imaging of the chest was performed during intravenous contrast administration.  CONTRAST:  85mL OMNIPAQUE IOHEXOL 300 MG/ML  SOLN  COMPARISON:  CT chest dated 12/11/2012  FINDINGS: Evaluation of the lung parenchyma is constrained by respiratory motion.  Two right upper lobe pulmonary nodules measuring up to 6 mm (series 5/images 14-15). This is significantly improved from the prior and may reflect residual post infectious/inflammatory scarring.  6 mm left upper lobe nodule (series 5/image 15), grossly unchanged.  6 mm left lower lobe nodular opacity along the fissure (series 5/ image 20), more conspicuous on the current study but likely grossly unchanged.  Multiple tree-in-bud nodular opacities in the left lower lobe, measuring up to 5 mm (series 5/images 40 and 42), new from prior chest CT and possibly infectious/inflammatory.  Trace bilateral pleural effusions.  No pneumothorax.  Visualized thyroid is unremarkable.  The heart is normal in size. No pericardial effusion. Coronary atherosclerosis in the LAD.  Small right paratracheal and subcarinal lymph nodes measuring up to 7-8 mm short axis, within normal limits. No suspicious  hilar or axillary lymphadenopathy.  Visualized upper abdomen is unchanged, noting a 3.1 x 2.5 cm hypoenhancing lesion in the central left hepatic lobe (series 2/image 54), suspicious for metastasis.  Degenerative changes of the visualized thoracic spine. Fusion at T9-10.  IMPRESSION: Limited evaluation due to respiratory motion.  Scattered bilateral pulmonary nodules measuring up to 6 mm, as described above. Residual right upper lobe nodularity is improved and may reflect post infectious/inflammatory scarring. Left upper lobe and superior left lower lobe nodularity are unchanged and nonspecific. Left lower lobe tree-in-bud nodularity is new and favored to be infectious/inflammatory.  None of these findings are specific for metastatic disease to the chest. Attention on follow-up is suggested, preferably when patient is better able to breath hold.  Electronically Signed   By: Julian Hy M.D.   On: 03/15/2013 19:58   Ct Abdomen Pelvis W Contrast  03/14/2013   ADDENDUM REPORT: 03/14/2013 14:22  ADDENDUM: The original report was by Dr. Nyoka Lint. The following addendum is by Dr. Van Clines:  I spoke by telephone about this case with Dr. Michael Boston at 2:15 p.m. on 03/13/2013.  He noted that on physical exam the patient has a hard rectal mass. Accordingly I am suspicious that the left perirectal 2.0 x 1.5 cm lesion is actually a metastatic lymph node and also that the enlarging hypodense lesion in the lateral segment left hepatic lobe is probably a metastatic lesion. We discussed this relative likelihood.  There is a marginally enhancing fluid collection just below the right ischial tuberosity extending towards the skin surface along the right lower buttock which may reflect a decubitus ulcer.   Electronically Signed   By: Sherryl Barters M.D.   On: 03/14/2013 14:22   03/14/2013   CLINICAL DATA:  Bloody stools for 1 day. History of pneumoperitoneum, C difficile colitis, and previous intra-abdominal  abscess.  EXAM: CT ABDOMEN AND PELVIS WITH CONTRAST  TECHNIQUE: Multidetector CT imaging of the abdomen and pelvis was performed using the standard protocol following bolus administration of intravenous contrast.  CONTRAST:  134mL OMNIPAQUE IOHEXOL 300 MG/ML  SOLN  COMPARISON:  CT ABD/PELVIS W CM dated 12/22/2012; DG ABD 2 VIEWS dated 12/18/2012; CT ABD/PELVIS W CM dated 12/15/2012; CT ABD/PELV WO CM dated 12/05/2012; CT ABD/PELVIS W CM dated 12/11/2012  FINDINGS: No lung infiltrates.  No effusion or pneumothorax.  Pneumoperitoneum previously detected has resolved. Left lower quadrant fluid collection noted previously also resolved.  Persistent left perirectal peripherally enhancing fluid collection essentially stable in size when compared most recent priors, measuring 15 x 20 mm cross-section on image 82, extending over a craniocaudal length of 26 mm. It is immediately adjacent to the wall of the rectum. There is moderate surrounding stranding. Residual perirectal abscess is likely, and possibly correlates with bloody stools.  Slight interval growth in a hypoattenuating lesion involving the left lobe of the liver now measuring 25 x 23 mm as compared with 17 x 15 mm on 12 714. Given the previous suspected intraperitoneal inflammatory process, a liver abscess cannot be excluded. Tissue sampling may be warranted. No other similar lesions.  Unremarkable appearing and unchanged spleen, pancreas, kidneys, adrenal glands, and gallbladder. No bowel obstruction. No appendiceal inflammation. No worrisome osseous lesions. Atheromatous change of the aorta without aneurysmal dilatation. Previously noted air in the bladder has resolved and was likely from previous instrumentation. Mild prostatic calcifications.  IMPRESSION: Pneumoperitoneum and left lower quadrant fluid collection noted previously have resolved.  Slight increased hypoattenuating lesion involving the left lobe of the liver measuring 25 x 23 mm cross-section. Liver  abscess not excluded.  Persistent left perirectal peripherally enhancing fluid collection measuring 15 x 20 x 26 mm, likely residual perirectal abscess.  Electronically Signed: By: Rolla Flatten M.D. On: 03/13/2013 18:13    Microbiology: No results found for this or any previous visit (from the past 240 hour(s)).   Labs: Basic Metabolic Panel:  Recent Labs Lab 03/15/13 0552 03/16/13 0608 03/17/13 0456 03/18/13 0525 03/19/13 0535  NA 140 139 139 139 142  K 3.8 3.8 3.7 3.5* 3.8  CL 107 104 106 102 105  CO2 $Re'23 23 21 25 25  'SlC$ GLUCOSE 94 89 96 101* 96  BUN $Re'10 7 11 9 12  'Ffk$ CREATININE 1.02 0.98 1.12  0.94 1.15  CALCIUM 7.9* 7.9* 7.6* 7.7* 8.0*   Liver Function Tests: No results found for this basename: AST, ALT, ALKPHOS, BILITOT, PROT, ALBUMIN,  in the last 168 hours No results found for this basename: LIPASE, AMYLASE,  in the last 168 hours No results found for this basename: AMMONIA,  in the last 168 hours CBC:  Recent Labs Lab 03/13/13 1200  03/15/13 0552 03/16/13 0608 03/17/13 0456 03/18/13 0525 03/19/13 0535  WBC 7.8  --  7.0 7.1 7.5 7.9 9.8  NEUTROABS 4.9  --   --   --   --   --   --   HGB 12.1*  < > 10.5* 11.3* 10.3* 11.1* 10.8*  HCT 37.4*  < > 32.9* 34.5* 32.2* 34.4* 33.8*  MCV 84.8  --  85.2 84.6 84.7 84.3 84.1  PLT 329  --  267 310 256 280 275  < > = values in this interval not displayed. Cardiac Enzymes: No results found for this basename: CKTOTAL, CKMB, CKMBINDEX, TROPONINI,  in the last 168 hours BNP: BNP (last 3 results)  Recent Labs  12/07/12 0335 12/18/12 0300  PROBNP 680.8* 53.8   CBG: No results found for this basename: GLUCAP,  in the last 168 hours     Signed:  Kinnie Feil  [facility] 03/19/2013, 9:53 AM

## 2013-03-19 NOTE — Progress Notes (Signed)
[facility] PROGRESS NOTE  Victor Little ZOX:[id number] DOB: 07-Sep-1945 DOA: 03/13/2013 PCP: Victor Brothers, MD  Assessment/Plan:  68 y.o. male with a PMH of organic brain syndrome with deafness/muteness, prior SBO, prior pneumoperitoneum 12/14, clostridium difficile colitis, not on chronic ASA, who was sent from [facility] with a 24 hour history of bloody stools  1. Lower GI bleed due to probable colon cancer, two separate areas identified on colonoscopy, however only mass closest to rectum demonstrated adenocarcinoma.   -no s/s of new bleeding  -  CEA 24.6 -  Appreciate Dr. Michail Little and Dr. Benay Little assistance; outpatient follow up to schedule chemotherapy  -  CT chest neg for obvious met -  Per notes, plan for palliative XRT to be arranged as outpatient  2. 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 Hypertension, blood pressure within normal limits -  Continue holding blood pressure medications except for metoprolol  4. Neuropathy, stable, continue Neurontin  5. Iron deficiency anemia -  Continue ferrous sulfate  Diet:  Regular Access:  PIV IVF:  Yes Proph:  SCDs  Code Status: Full code Family Communication: Spoke with patient and sister Disposition Plan:  To SNF.  Awaiting PASARR.  Thank you to SW for excellent work.    Consultants:  General surgery  Infectious disease  Gastroenterology   Procedures:  Colo with bx on 3/1  CT of abdomen and pelvis  CT chest  Antibiotics:  None    Code Status: full Family Communication: d/w patient, called updated Victor Little Sister [phone]  (indicate person spoken with, relationship, and if by phone, the number) Disposition Plan:    HPI/Subjective: alert  Objective: Filed Vitals:   03/19/13 0610  BP: 120/78  Pulse: 77  Temp: 98.7 F (37.1 C)  Resp: 18    Intake/Output Summary (Last 24 hours) at 03/19/13  0945 Last data filed at 03/19/13 5621  Gross per 24 hour  Intake    540 ml  Output   2300 ml  Net  -1760 ml   There were no vitals filed for this visit.  Exam:   General:  alert  Cardiovascular: s1,s2 rrr  Respiratory: CTA BL  Abdomen: soft, nt, nd   Musculoskeletal: no LE edema   Data Reviewed: Basic Metabolic Panel:  Recent Labs Lab 03/15/13 0552 03/16/13 0608 03/17/13 0456 03/18/13 0525 03/19/13 0535  NA 140 139 139 139 142  K 3.8 3.8 3.7 3.5* 3.8  CL 107 104 106 102 105  CO2 _0 GLUCOSE 94 89 96 101* 96  BUN _1 CREATININE 1.02 0.98 1.12 0.94 1.15  CALCIUM 7.9* 7.9* 7.6* 7.7* 8.0*   Liver Function Tests: No results found for this basename: AST, ALT, ALKPHOS, BILITOT, PROT, ALBUMIN,  in the last 168 hours No results found for this basename: LIPASE, AMYLASE,  in the last 168 hours No results found for this basename: AMMONIA,  in the last 168 hours CBC:  Recent Labs Lab 03/13/13 1200  03/15/13 0552 03/16/13 0608 03/17/13 0456 03/18/13 0525 03/19/13 0535  WBC 7.8  --  7.0 7.1 7.5 7.9 9.8  NEUTROABS 4.9  --   --   --   --   --   --   HGB 12.1*  < > 10.5* 11.3* 10.3* 11.1* 10.8*  HCT 37.4*  < > 32.9* 34.5* 32.2* 34.4* 33.8*  MCV 84.8  --  85.2  84.6 84.7 84.3 84.1  PLT 329  --  267 310 256 280 275  < > = values in this interval not displayed. Cardiac Enzymes: No results found for this basename: CKTOTAL, CKMB, CKMBINDEX, TROPONINI,  in the last 168 hours BNP (last 3 results)  Recent Labs  12/07/12 0335 12/18/12 0300  PROBNP 680.8* 53.8   CBG: No results found for this basename: GLUCAP,  in the last 168 hours  No results found for this or any previous visit (from the past 240 hour(s)).   Studies: No results found.  Scheduled Meds: . feeding supplement (ENSURE COMPLETE)  237 mL Oral BID BM  . ferrous sulfate  325 mg Oral QPC breakfast  . gabapentin  300 mg Oral TID  . loratadine  10 mg Oral Daily  . metoprolol  tartrate  25 mg Oral Daily  . mirtazapine  7.5 mg Oral QHS  . multivitamin with minerals  1 tablet Oral Daily  . 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 If 7PM-7AM, please contact night-coverage at [email or web address], password [facility] 03/19/2013, 9:45 AM  LOS: 6 days

## 2013-03-19 NOTE — Progress Notes (Signed)
Clinical Social Work  CSW received [employer] administrator for patient. CSW called [employer] [facility] and spoke with Santiago Glad and Raquel who reports unsure if they can accept patient since pasarr will expire in 30 days and they would have to reapply. CSW explained that SNF sent patient without pasarr and reported they were agreeable to accept patient back once pasarr was received. CSW asked to speak with administrator Jeani Hawking) at [facility] and was informed that he would contact CSW to discuss case.  In the meantime, CSW staffed case with director [facility]) who instructed CSW to talk with administrator about their responsibility to accept patient. CSW will contact director if SNF continues to refuse to accept patient.  CSW will continue to follow.  [place], [place] [phone]

## 2013-03-20 ENCOUNTER — Ambulatory Visit: Admit: 2013-03-20 | Payer: PRIVATE HEALTH INSURANCE | Admitting: Radiation Oncology

## 2013-03-20 DIAGNOSIS — J189 Pneumonia, unspecified organism: Secondary | ICD-10-CM

## 2013-03-20 DIAGNOSIS — R509 Fever, unspecified: Secondary | ICD-10-CM

## 2013-03-20 DIAGNOSIS — N179 Acute kidney failure, unspecified: Secondary | ICD-10-CM

## 2013-03-20 DIAGNOSIS — H9190 Unspecified hearing loss, unspecified ear: Secondary | ICD-10-CM

## 2013-03-20 DIAGNOSIS — J9600 Acute respiratory failure, unspecified whether with hypoxia or hypercapnia: Secondary | ICD-10-CM

## 2013-03-20 LAB — BASIC METABOLIC PANEL
BUN: 13 mg/dL (ref 6–23)
CALCIUM: 7.9 mg/dL — AB (ref 8.4–10.5)
CHLORIDE: 107 meq/L (ref 96–112)
CO2: 26 mEq/L (ref 19–32)
CREATININE: 0.95 mg/dL (ref 0.50–1.35)
GFR calc non Af Amer: 85 mL/min — ABNORMAL LOW (ref >90)
Glucose, Bld: 101 mg/dL — ABNORMAL HIGH (ref 70–99)
Potassium: 3.9 mEq/L (ref 3.7–5.3)
Sodium: 142 mEq/L (ref 137–147)

## 2013-03-20 LAB — CBC
HCT: 33.2 % — ABNORMAL LOW (ref 39.0–52.0)
Hemoglobin: 10.5 g/dL — ABNORMAL LOW (ref 13.0–17.0)
MCH: 26.6 pg (ref 26.0–34.0)
MCHC: 31.6 g/dL (ref 30.0–36.0)
MCV: 84.1 fL (ref 78.0–100.0)
PLATELETS: 273 10*3/uL (ref 150–400)
RBC: 3.95 MIL/uL — ABNORMAL LOW (ref 4.22–5.81)
RDW: 14.4 % (ref 11.5–15.5)
WBC: 8.9 10*3/uL (ref 4.0–10.5)

## 2013-03-20 MED ORDER — TRAMADOL HCL 50 MG PO TABS: 50.0000 mg | ORAL_TABLET | Freq: Four times a day (QID) | ORAL | Status: DC | PRN

## 2013-03-20 NOTE — Progress Notes (Signed)
Clinical Social Work  Patient was discussed during [employer]. CSW explained that CSW had tried calling SNF again this morning and left a message for administrator but was informed that he was in a meeting and unsure when he would be available. Medical director and CSW director (Dr. Reynaldo Minium and [facility]) instructed CSW to make a state report. CSW called and left a message with Tommi Rumps for additional assistance.  CSW updated MD on barriers and will continue to follow.  [place],  [phone]

## 2013-03-20 NOTE — Progress Notes (Signed)
Clinical Social Work  CSW spoke with Raquel at [facility] who is agreeable to accept patient back today. CSW faxed DC summary to SNF and prepared DC packet with FL2 and hard scripts. CSW informed patient, sister, RN, and MD of DC plans and all parties agreeable. RN to call report. CSW coordinated transportation via [place]. Request #: [id number].  CSW is signing off but available if needed.  [place], [place] [phone]

## 2013-03-20 NOTE — Progress Notes (Signed)
Discharge instructions accompanied pt, left the unit, transported to Nursing facility via ambulance.

## 2013-03-20 NOTE — Progress Notes (Signed)
[facility] PROGRESS NOTE  ARICK MARENO HLK:[id number] DOB: [birth year] DOA: 03/13/2013 PCP: [name], MD  Assessment/Plan:  68 y.o. male with a PMH of organic brain syndrome with deafness/muteness, prior SBO, prior pneumoperitoneum 12/14, clostridium difficile colitis, not on chronic ASA, who was sent from [facility] with a 24 hour history of bloody stools  1. Lower GI bleed due to probable colon cancer, two separate areas identified on colonoscopy, however only mass closest to rectum demonstrated adenocarcinoma.   -no s/s of new bleeding  -  CEA 24.6 -  Appreciate Dr. Michail Sermon and Dr. Benay Spice assistance; outpatient follow up to schedule chemotherapy  -  CT chest neg for obvious met -  Per notes, plan for palliative XRT to be arranged as outpatient  2. 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 Hypertension, blood pressure within normal limits -  Continue holding blood pressure medications except for metoprolol  4. Neuropathy, stable, continue Neurontin  5. Iron deficiency anemia -  Continue ferrous sulfate  Diet:  Regular Access:  PIV IVF:  Yes Proph:  SCDs  Code Status: Full code Family Communication: Spoke with patient and sister Disposition Plan:  To SNF.  Awaiting [facility].  Thank you to SW for excellent work.    Consultants:  General surgery  Infectious disease  Gastroenterology   Procedures:  Colo with bx on 3/1  CT of abdomen and pelvis  CT chest  Antibiotics:  None    Code Status: full Family Communication: d/w patient, called updated [name],[name] Sister [phone]  (indicate person spoken with, relationship, and if by phone, the number) Disposition Plan:    HPI/Subjective: alert  Objective: Filed Vitals:   03/20/13 0700  BP: 142/85  Pulse: 83  Temp: 98.2 F (36.8 C)  Resp: 20    Intake/Output Summary (Last 24 hours) at 03/20/13  1021 Last data filed at 03/20/13 0600  Gross per 24 hour  Intake    300 ml  Output    800 ml  Net   -500 ml   There were no vitals filed for this visit.  Exam:   General:  alert  Cardiovascular: s1,s2 rrr  Respiratory: CTA BL  Abdomen: soft, nt, nd   Musculoskeletal: no LE edema   Data Reviewed: Basic Metabolic Panel:  Recent Labs Lab 03/16/13 0608 03/17/13 0456 03/18/13 0525 03/19/13 0535 03/20/13 0528  NA 139 139 139 142 142  K 3.8 3.7 3.5* 3.8 3.9  CL 104 106 102 105 107  CO2 _0 GLUCOSE 89 96 101* 96 101*  BUN _1 CREATININE 0.98 1.12 0.94 1.15 0.95  CALCIUM 7.9* 7.6* 7.7* 8.0* 7.9*   Liver Function Tests: No results found for this basename: AST, ALT, ALKPHOS, BILITOT, PROT, ALBUMIN,  in the last 168 hours No results found for this basename: LIPASE, AMYLASE,  in the last 168 hours No results found for this basename: AMMONIA,  in the last 168 hours CBC:  Recent Labs Lab 03/13/13 1200  03/16/13 0608 03/17/13 0456 03/18/13 0525 03/19/13 0535 03/20/13 0528  WBC 7.8  < > 7.1 7.5 7.9 9.8 8.9  NEUTROABS 4.9  --   --   --   --   --   --   HGB 12.1*  < > 11.3* 10.3* 11.1* 10.8* 10.5*  HCT 37.4*  < > 34.5* 32.2* 34.4* 33.8* 33.2*  MCV 84.8  < >  84.6 84.7 84.3 84.1 84.1  PLT 329  < > 310 256 280 275 273  < > = values in this interval not displayed. Cardiac Enzymes: No results found for this basename: CKTOTAL, CKMB, CKMBINDEX, TROPONINI,  in the last 168 hours BNP (last 3 results)  Recent Labs  12/07/12 0335 12/18/12 0300  PROBNP 680.8* 53.8   CBG: No results found for this basename: GLUCAP,  in the last 168 hours  No results found for this or any previous visit (from the past 240 hour(s)).   Studies: No results found.  Scheduled Meds: . feeding supplement (ENSURE COMPLETE)  237 mL Oral BID BM  . ferrous sulfate  325 mg Oral QPC breakfast  . gabapentin  300 mg Oral TID  . loratadine  10 mg Oral Daily  . metoprolol  tartrate  25 mg Oral Daily  . mirtazapine  7.5 mg Oral QHS  . multivitamin with minerals  1 tablet Oral Daily  . 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 If 7PM-7AM, please contact night-coverage at [email or web address], password [facility] 03/20/2013, 10:21 AM  LOS: 7 days

## 2013-03-21 ENCOUNTER — Other Ambulatory Visit: Payer: Self-pay | Admitting: *Deleted

## 2013-03-24 ENCOUNTER — Encounter: Payer: Self-pay | Admitting: Radiation Oncology

## 2013-03-24 ENCOUNTER — Telehealth: Payer: Self-pay | Admitting: Oncology

## 2013-03-24 NOTE — Progress Notes (Addendum)
GU Location of Tumor / Histology: Rectal Cancer   Patient presented  months ago with signs/symptoms of: rectal bleeding 03/13/13 24 hour history bloody stools from [facility] facility  Biopsies of  (if applicable) revealed:  Diagnosis 03/16/13:1. Colon, biopsy, 30cm sigmoid colon, MULTIPLE FRAGMENTS OF TUBULAR ADENOMA. PLEASE SEE COMMENT.2. Rectum, biopsy INVASIVE ADENOCARCINOMA.  Past/Anticipated interventions by urology, if any:   Past/Anticipated interventions by medical oncology, if any: Dr.Sherrill appt 04/15/13 lab appt 1st  Weight changes, if any: unable to twell, patient from [place] care, CNA Curtis Sites accompanied patient, she works on another unit, doesn't know this patient,  Bowel/Bladder complaints, if any: wearing depends,   Nausea/Vomiting, if any:  Pain issues, if any:  Pt unable to speak, asked if he knew sign language, shakes head no  SAFETY ISSUES:  Prior radiation? NO  Pacemaker/ICD? No Is the patient on methotrexate? No  Current Complaints / other details:  At [facility] ,plan for palliative XRT, patient with a PMH of organic brain syndrome with deafness/muteness,sprior SBO prior pneumoperitoneum, liver mass,left lobe,probable metastasis Sister [name], [phone], spoke with unit manager ion [address], Apolonio Schneiders , asked about family memeber or Medical POA if coming with patient, patient uinable to understand consult for Radiation, gave appt card for Dr.Sherrill 04/15/13 to Rollene Fare to give to manager and spoke with Apolonio Schneiders about this coming up appt as well May need to reschedule will call patient's sister

## 2013-03-26 ENCOUNTER — Ambulatory Visit
Admission: RE | Admit: 2013-03-26 | Discharge: 2013-03-26 | Disposition: A | Discharge status: Home / Self Care | Payer: PRIVATE HEALTH INSURANCE | Source: Ambulatory Visit | Attending: Radiation Oncology | Admitting: Radiation Oncology

## 2013-03-26 ENCOUNTER — Telehealth: Payer: Self-pay | Admitting: *Deleted

## 2013-03-26 ENCOUNTER — Encounter: Payer: Self-pay | Admitting: Radiation Oncology

## 2013-03-26 VITALS — BP 122/71 | HR 78 | Temp 97.9°F | Resp 16 | Wt 168.9 lb

## 2013-03-26 DIAGNOSIS — C787 Secondary malignant neoplasm of liver and intrahepatic bile duct: Secondary | ICD-10-CM

## 2013-03-26 DIAGNOSIS — C20 Malignant neoplasm of rectum: Secondary | ICD-10-CM

## 2013-03-26 HISTORY — DX: Anemia, unspecified: D64.9

## 2013-03-26 HISTORY — DX: Malignant neoplasm of colon, unspecified: C18.9

## 2013-03-26 NOTE — Telephone Encounter (Signed)
Called patient sister [name] [phone], uno answer, tried x 2 different times, informrd Dr.moody no answer, no answering machine to leave message 3:44 PM

## 2013-03-26 NOTE — Progress Notes (Signed)
Please see the Nurse Progress Note in the MD Initial Consult Encounter for this patient. 

## 2013-03-27 ENCOUNTER — Telehealth: Payer: Self-pay | Admitting: *Deleted

## 2013-03-27 NOTE — Telephone Encounter (Addendum)
Patient was not accompanied by his sister at 03/25/13 visit with Dr. Lisbeth Renshaw, therefore patient could not have full radiation consult.  This RN spoke with Education officer, museum at [place] [phone] ext. [phone])  re: patient's situation, need to re-schedule with Dr. Lisbeth Renshaw, and upcoming appointment with Dr. Benay Spice.  The SW stated that a call has been made out to the [facility] regarding patient's guardianship.  She stated she is aware of patient's cancer diagnosis and need for treatment.   This RN will contact [place] re: above to assist with situation.  This RN will notify Dr. Lisbeth Renshaw and his RN, Thayer Headings.

## 2013-03-27 NOTE — Telephone Encounter (Signed)
eror

## 2013-03-28 ENCOUNTER — Telehealth: Payer: Self-pay | Admitting: *Deleted

## 2013-03-28 NOTE — Telephone Encounter (Signed)
Spoke with Education officer, museum, [name], at [facility].  Gave appointment with Dr. Lisbeth Renshaw for 3/23.  She stated that patient's sister [name]) requested to be on the phone by speaker during MD visit because she is unable to drive.  This RN requested that the SNF send someone to MD visit with patient,  SW or a caregiver, who knows the patient's case.  SW Drue Dun [phone] ext. [phone]) stated she would come with the patient for the visit due to the special circumstances.  This RN also spoke with sister  and she is aware of 3/23 appointment and told this RN she does not drive, is unable to be at the visit, but would like to be contacted by phone during MD visit.  [place] notified of above.  Will also inform Dr. Lisbeth Renshaw of above.

## 2013-03-30 NOTE — Progress Notes (Signed)
The patient presented today for a consultation for possible palliative radiation treatment. In reviewing his available medical records, the patient appears to be a reasonable candidate for this. The patient was not accompanied by someone who understood sign language from his facility. No effective communication was achievable today. We therefore will schedule the patient for another visit in our clinic and this will be coordinated with a simulation if we decide to proceed with treatment. We will coordinate this visit such that a sign language interpreter is available and we also will contact his family to be able to achieve appropriate consent to proceed with treatment.

## 2013-04-04 ENCOUNTER — Encounter: Payer: Self-pay | Admitting: *Deleted

## 2013-04-04 NOTE — Progress Notes (Unsigned)
[place] Work  Clinical Social Work was referred by patient navigator for assessment of psychosocial needs and follow up on coordination of care with SNF, sign language interpreter and HCPOA.  Clinical Social Worker spoke with SW at [facility], Joya Martyr #[phone] to check on plans for Monday, March 23 appointment. Per SNF CSW, Pt's guardian and sister, [name] has provided them with HCPOA paperwork and they plan to bring a copy with them to this upcoming appointment. [facility] CSW will make sure a copy is sent to medical records to be scanned in to pt's chart.   [employer] will transport patient and their social worker will come with him as well. Sister, Thayer Headings has arranged transportation with pt's niece. CSW has arranged sign language interpreter for all upcoming appointments through 04/15/13. The interpreter should be here 15 minutes prior to appointment, if not we can call [phone] to check on their arrival time.   Per SNF, Pt is adjusting well and settling in there. CSW will continue to follow and assist.     Clinical Social Work interventions: Coordination of resources/case management  Loren Racer, [place] Social Worker [facility] for [place] Wednesday, Thursday and Friday Phone: [phone] Fax: [phone]

## 2013-04-07 ENCOUNTER — Ambulatory Visit
Admission: RE | Admit: 2013-04-07 | Discharge: 2013-04-07 | Disposition: A | Discharge status: Home / Self Care | Payer: PRIVATE HEALTH INSURANCE | Source: Ambulatory Visit | Attending: Radiation Oncology | Admitting: Radiation Oncology

## 2013-04-07 ENCOUNTER — Encounter: Payer: Self-pay | Admitting: *Deleted

## 2013-04-07 ENCOUNTER — Encounter: Payer: Self-pay | Admitting: Radiation Oncology

## 2013-04-07 ENCOUNTER — Other Ambulatory Visit: Payer: Self-pay | Admitting: *Deleted

## 2013-04-07 VITALS — BP 124/76 | HR 74 | Temp 97.6°F | Resp 20 | Ht 72.0 in | Wt 171.2 lb

## 2013-04-07 DIAGNOSIS — I10 Essential (primary) hypertension: Secondary | ICD-10-CM | POA: Insufficient documentation

## 2013-04-07 DIAGNOSIS — F79 Unspecified intellectual disabilities: Secondary | ICD-10-CM | POA: Insufficient documentation

## 2013-04-07 DIAGNOSIS — Z79899 Other long term (current) drug therapy: Secondary | ICD-10-CM | POA: Insufficient documentation

## 2013-04-07 DIAGNOSIS — C20 Malignant neoplasm of rectum: Secondary | ICD-10-CM | POA: Insufficient documentation

## 2013-04-07 DIAGNOSIS — H9190 Unspecified hearing loss, unspecified ear: Secondary | ICD-10-CM | POA: Insufficient documentation

## 2013-04-07 DIAGNOSIS — C787 Secondary malignant neoplasm of liver and intrahepatic bile duct: Secondary | ICD-10-CM | POA: Insufficient documentation

## 2013-04-07 DIAGNOSIS — R4701 Aphasia: Secondary | ICD-10-CM | POA: Insufficient documentation

## 2013-04-07 NOTE — Progress Notes (Signed)
Please see the Nurse Progress Note in the MD Initial Consult Encounter for this patient. 

## 2013-04-07 NOTE — [facility] Oncology Navigator Note (Signed)
Met with patient,  patient's sister, niece, SNF nurse, and sign language interpreter.  Explained role of nurse navigator.  After MD visit with Dr. Lisbeth Renshaw, gave educational information to family on colorectal cancer.  Contact names and numbers of [place] treating team were provided along with [facility] resources.  Patient and family did not have any questions after visit with Dr. Lisbeth Renshaw.  They are aware of visit with Dr. Benay Spice on 04/15/13.  Barriers to care include communication as patient is deaf. Patient currently living in SNF.  Patient has had weight loss.  Will refer to dietician. Will continue to follow as needed.

## 2013-04-07 NOTE — Progress Notes (Addendum)
func colon cancer, interpreter Elwyn Reach, sister [name], and niece, Vernie Shanks ,SW called,Rachel from nursing home, patient in w/c, c/o right shoulder pain, right lower abdomen and right hip a 4/10 scale, P Gaur[place] notorized paper brought made copy,  regular bowel movements no blood ,  Good appetite,  C/o dizziness, unsteady when weighing patient, no nausea stated by staff or patient, patient knows some sign language, was told patient can read lips,  4:00 PM

## 2013-04-08 ENCOUNTER — Telehealth: Payer: Self-pay | Admitting: *Deleted

## 2013-04-08 ENCOUNTER — Encounter: Payer: Self-pay | Admitting: *Deleted

## 2013-04-08 NOTE — Progress Notes (Signed)
[place] Work  Clinical Social Work met with pt, pt's family, SNF employee, and interpreter to assess for needs, offer continued support, and follow up regarding HCPOA.  Pt's sister is his named guardian and provided documentation.  CSW informed pt and family of support services at [facility], and encouraged them to call with any questions or concerns. CSW will continue to follow and support as needed.    Johnnye Lana, MSW, [place] Worker [facility] [phone]

## 2013-04-08 NOTE — Telephone Encounter (Signed)
Per 3/23 POF I have scheduled appt for patient. I have emailed Earlene Plater Best with information

## 2013-04-09 ENCOUNTER — Ambulatory Visit
Admission: RE | Admit: 2013-04-09 | Discharge: 2013-04-09 | Disposition: A | Discharge status: Home / Self Care | Payer: PRIVATE HEALTH INSURANCE | Source: Ambulatory Visit | Attending: Radiation Oncology | Admitting: Radiation Oncology

## 2013-04-09 DIAGNOSIS — C20 Malignant neoplasm of rectum: Secondary | ICD-10-CM | POA: Diagnosis not present

## 2013-04-09 DIAGNOSIS — Z79899 Other long term (current) drug therapy: Secondary | ICD-10-CM | POA: Insufficient documentation

## 2013-04-09 DIAGNOSIS — Z510 Encounter for antineoplastic radiation therapy: Secondary | ICD-10-CM | POA: Insufficient documentation

## 2013-04-09 DIAGNOSIS — C787 Secondary malignant neoplasm of liver and intrahepatic bile duct: Secondary | ICD-10-CM

## 2013-04-09 NOTE — Progress Notes (Signed)
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   HISTORY OF PRESENT ILLNESS::Victor Little is a 68 y.o. male who is seen for an initial consultation visit. The patient is seen today with multiple family members. The patient was able to give a little bit of history and additional information was obtained from the medical chart. His family also provided information when possible. The patient was diagnosed with C. difficile colitis and sepsis in November of 2014. The patient experienced respiratory failure and developed an abdominal abscess as well as pneumoperitoneum. The medium. He was discharged in December to a skilled nursing facility.  The patient subsequently was noted to have some rectal bleeding in February of 2015. A CT scan of the abdomen and pelvis was obtained. A increased hypoattenuating lesion was seen within the left lobe of the liver measuring 2.5 x 2.3 cm. Persistent left perirectal enhancing fluid collection was seen measuring 2.6 cm. This was consistent with metastatic rectal cancer it was felt given the patient's history. Additional imaging has included a CT scan of the chest. No specific findings of metastatic disease within the chest were seen.  The patient underwent a colonoscopy on 03/16/2013. A rectal mass was noted beginning 4 cm from the anal verge. Multiple biopsies were obtained. This returned positive for invasive adenocarcinoma. Colon biopsy at 30 cm demonstrated a tubular adenoma.  The patient has been seen by Dr. Benay Spice in medical oncology. He has been diagnosed clinically with metastatic rectal cancer. Dr. Benay Spice does not feel that the patient is a good candidate for aggressive treatment  at this time, either in terms of surgery or chemotherapy most likely. I have been asked to see the patient today for consideration of palliative radiation treatment to the pelvis. The patient really is unable to indicate whether he continues to have some bleeding although this would be one reason in addition to preventing bowel obstruction for radiation treatment at this time.   PREVIOUS RADIATION THERAPY: No   PAST MEDICAL HISTORY:  has a past medical history of Mental retardation; Organic brain syndrome; Hypertension; Deaf; Clostridium difficile colitis; Pneumonia; Cellulitis; Deaf; Mutism; Neuropathy; Intra-abdominal abscess (12/19/2012); Pneumoperitoneum of unknown etiology (12/19/2012); Colon cancer (03/16/13); and Anemia.     PAST SURGICAL HISTORY: Past Surgical History  Procedure Laterality Date  . Orif right patella  09/23/2005  . Removal of right patella hardware  01/26/2006  . Colonoscopy N/A 03/16/2013    Procedure: COLONOSCOPY;  Surgeon: Beryle Beams, MD;  Location: [facility];  Service: Endoscopy;  Laterality: N/A;     FAMILY HISTORY: family history is not on file.   SOCIAL HISTORY:  reports that he has never smoked. He has never used smokeless tobacco. He reports that he drinks about 1.8 ounces of alcohol per week. He reports that he does not use illicit drugs.   ALLERGIES: Review of patient's allergies indicates no known allergies.   MEDICATIONS:  Current Outpatient Prescriptions  Medication Sig Dispense Refill  . albuterol (PROVENTIL) (5 MG/ML) 0.5% nebulizer solution Take 0.63 mg by nebulization every 2 (two) hours as needed for wheezing or shortness of breath. 0.63mg /28ml      . benazepril (LOTENSIN) 5 MG tablet Take 5 mg by mouth daily.      Marland Kitchen  feeding supplement, ENSURE, (ENSURE) PUDG Take 1 Container by mouth daily.  30 Container  11  . ferrous sulfate 325 (65 FE) MG tablet Take 325 mg by mouth daily with breakfast.      . furosemide (LASIX) 20 MG tablet Take 20 mg by  mouth daily.      Marland Kitchen gabapentin (NEURONTIN) 300 MG capsule Take 1 capsule by mouth 3 (three) times daily.      Marland Kitchen loratadine (CLARITIN) 10 MG tablet Take 10 mg by mouth daily.      . metoprolol tartrate (LOPRESSOR) 25 MG tablet Take 25 mg by mouth daily.      . mirtazapine (REMERON) 7.5 MG tablet Take 7.5 mg by mouth at bedtime.      . Multiple Vitamin (MULTIVITAMIN WITH MINERALS) TABS tablet Take 1 tablet by mouth daily.      . Probiotic Product (PROBIOTIC FORMULA PO) Take 1 capsule by mouth daily.      Marland Kitchen saccharomyces boulardii (FLORASTOR) 250 MG capsule Take 250 mg by mouth every morning.      . traMADol (ULTRAM) 50 MG tablet Take 1 tablet (50 mg total) by mouth every 6 (six) hours as needed.  30 tablet  0  . PRESCRIPTION MEDICATION Take 120 mLs by mouth 3 (three) times daily. Med Plus       No current facility-administered medications for this encounter.     REVIEW OF SYSTEMS:  A 15 point review of systems is documented in the electronic medical record. This was obtained by the nursing staff. However, I reviewed this with the patient to discuss relevant findings and make appropriate changes.  Pertinent items are noted in HPI.    PHYSICAL EXAM:  height is 6' (1.829 m) and weight is 171 lb 3.2 oz (77.656 kg). His oral temperature is 97.6 F (36.4 C). His blood pressure is 124/76 and his pulse is 74. His respiration is 20.   ECOG = 1  0 - Asymptomatic (Fully active, able to carry on all predisease activities without restriction)  1 - Symptomatic but completely ambulatory (Restricted in physically strenuous activity but ambulatory and able to carry out work of a light or sedentary nature. For example, light housework, office work)  2 - Symptomatic, <50% in bed during the day (Ambulatory and capable of all self care but unable to carry out any work activities. Up and about more than 50% of waking hours)  3 - Symptomatic, >50% in bed, but not bedbound (Capable of only limited self-care,  confined to bed or chair 50% or more of waking hours)  4 - Bedbound (Completely disabled. Cannot carry on any self-care. Totally confined to bed or chair)  5 - Death   Eustace Pen MM, Creech RH, Tormey DC, et al. [phone]). "Toxicity and response criteria of the [facility]". [place] Oncol. 5 (6): 649-55  General: Well-developed, in no acute distress HEENT: Normocephalic, atraumatic; oral cavity clear Neck: Supple without any lymphadenopathy Cardiovascular: Regular rate and rhythm Respiratory: Clear to auscultation bilaterally GI: Soft, nontender, normal bowel sounds Extremities: No edema present Neuro: No focal deficits Rectal exam deferred today. This can be completed at the time of simulation.    LABORATORY DATA:  Lab Results  Component Value Date   WBC 8.9 03/20/2013   HGB 10.5* 03/20/2013   HCT 33.2* 03/20/2013   MCV 84.1 03/20/2013   PLT 273 03/20/2013   Lab Results  Component Value Date   NA 142 03/20/2013   K 3.9  03/20/2013   CL 107 03/20/2013   CO2 26 03/20/2013   Lab Results  Component Value Date   ALT 13 12/27/2012   AST 22 12/27/2012   ALKPHOS 107 12/27/2012   BILITOT 0.3 12/27/2012      RADIOGRAPHY: Ct Chest W Contrast  03/15/2013   CLINICAL DATA:  Suspected metastatic rectal mass, for staging. Shortness of breath, leukocytosis, history of pneumonia.  EXAM: CT CHEST WITH CONTRAST  TECHNIQUE: Multidetector CT imaging of the chest was performed during intravenous contrast administration.  CONTRAST:  37mL OMNIPAQUE IOHEXOL 300 MG/ML  SOLN  COMPARISON:  CT chest dated 12/11/2012  FINDINGS: Evaluation of the lung parenchyma is constrained by respiratory motion.  Two right upper lobe pulmonary nodules measuring up to 6 mm (series 5/images 14-15). This is significantly improved from the prior and may reflect residual post infectious/inflammatory scarring.  6 mm left upper lobe nodule (series 5/image 15), grossly unchanged.  6 mm left lower lobe nodular opacity  along the fissure (series 5/ image 20), more conspicuous on the current study but likely grossly unchanged.  Multiple tree-in-bud nodular opacities in the left lower lobe, measuring up to 5 mm (series 5/images 40 and 42), new from prior chest CT and possibly infectious/inflammatory.  Trace bilateral pleural effusions.  No pneumothorax.  Visualized thyroid is unremarkable.  The heart is normal in size. No pericardial effusion. Coronary atherosclerosis in the LAD.  Small right paratracheal and subcarinal lymph nodes measuring up to 7-8 mm short axis, within normal limits. No suspicious hilar or axillary lymphadenopathy.  Visualized upper abdomen is unchanged, noting a 3.1 x 2.5 cm hypoenhancing lesion in the central left hepatic lobe (series 2/image 54), suspicious for metastasis.  Degenerative changes of the visualized thoracic spine. Fusion at T9-10.  IMPRESSION: Limited evaluation due to respiratory motion.  Scattered bilateral pulmonary nodules measuring up to 6 mm, as described above. Residual right upper lobe nodularity is improved and may reflect post infectious/inflammatory scarring. Left upper lobe and superior left lower lobe nodularity are unchanged and nonspecific. Left lower lobe tree-in-bud nodularity is new and favored to be infectious/inflammatory.  None of these findings are specific for metastatic disease to the chest. Attention on follow-up is suggested, preferably when patient is better able to breath hold.   Electronically Signed   By: Julian Hy M.D.   On: 03/15/2013 19:58   Ct Abdomen Pelvis W Contrast  03/14/2013   ADDENDUM REPORT: 03/14/2013 14:22  ADDENDUM: The original report was by Dr. Nyoka Lint. The following addendum is by Dr. Van Clines:  I spoke by telephone about this case with Dr. Michael Boston at 2:15 p.m. on 03/13/2013.  He noted that on physical exam the patient has a hard rectal mass. Accordingly I am suspicious that the left perirectal 2.0 x 1.5 cm lesion is  actually a metastatic lymph node and also that the enlarging hypodense lesion in the lateral segment left hepatic lobe is probably a metastatic lesion. We discussed this relative likelihood.  There is a marginally enhancing fluid collection just below the right ischial tuberosity extending towards the skin surface along the right lower buttock which may reflect a decubitus ulcer.   Electronically Signed   By: Sherryl Barters M.D.   On: 03/14/2013 14:22   03/14/2013   CLINICAL DATA:  Bloody stools for 1 day. History of pneumoperitoneum, C difficile colitis, and previous intra-abdominal abscess.  EXAM: CT ABDOMEN AND PELVIS WITH CONTRAST  TECHNIQUE: Multidetector CT imaging of the abdomen and pelvis was  performed using the standard protocol following bolus administration of intravenous contrast.  CONTRAST:  182mL OMNIPAQUE IOHEXOL 300 MG/ML  SOLN  COMPARISON:  CT ABD/PELVIS W CM dated 12/22/2012; DG ABD 2 VIEWS dated 12/18/2012; CT ABD/PELVIS W CM dated 12/15/2012; CT ABD/PELV WO CM dated 12/05/2012; CT ABD/PELVIS W CM dated 12/11/2012  FINDINGS: No lung infiltrates.  No effusion or pneumothorax.  Pneumoperitoneum previously detected has resolved. Left lower quadrant fluid collection noted previously also resolved.  Persistent left perirectal peripherally enhancing fluid collection essentially stable in size when compared most recent priors, measuring 15 x 20 mm cross-section on image 82, extending over a craniocaudal length of 26 mm. It is immediately adjacent to the wall of the rectum. There is moderate surrounding stranding. Residual perirectal abscess is likely, and possibly correlates with bloody stools.  Slight interval growth in a hypoattenuating lesion involving the left lobe of the liver now measuring 25 x 23 mm as compared with 17 x 15 mm on 12 714. Given the previous suspected intraperitoneal inflammatory process, a liver abscess cannot be excluded. Tissue sampling may be warranted. No other similar  lesions.  Unremarkable appearing and unchanged spleen, pancreas, kidneys, adrenal glands, and gallbladder. No bowel obstruction. No appendiceal inflammation. No worrisome osseous lesions. Atheromatous change of the aorta without aneurysmal dilatation. Previously noted air in the bladder has resolved and was likely from previous instrumentation. Mild prostatic calcifications.  IMPRESSION: Pneumoperitoneum and left lower quadrant fluid collection noted previously have resolved.  Slight increased hypoattenuating lesion involving the left lobe of the liver measuring 25 x 23 mm cross-section. Liver abscess not excluded.  Persistent left perirectal peripherally enhancing fluid collection measuring 15 x 20 x 26 mm, likely residual perirectal abscess.  Electronically Signed: By: Rolla Flatten M.D. On: 03/13/2013 18:13       IMPRESSION:  The patient has a new diagnosis of metastatic rectal cancer.   He has experienced rectal bleeding and the tumor is a fairly large tumor on colonoscopy which will put him at risk of bowel obstruction with further growth as well. I believe that the patient is a good candidate for palliative radiation treatment. It is unclear whether he would be able to tolerate additional treatment including either chemotherapy or surgery. One possibility which I discussed with the patient and his family would be to proceed with palliative radiation treatment, then see how he is doing and make further decisions at a later date.    I discussed therefore went down a potential palliative course of radiation treatment to the pelvis. I discussed the potential benefit of such a treatment as well as the possible side effects and risks. All of their questions were answered. As best as possible I discussed with the patient the nature of his disease, possible prognosis, and possible treatment options as well as difficulties. The patient expressed some understanding of these issues. He appeared amenable to  proceeding with this treatment.   PLAN: The patient will be scheduled for a simulation as soon as possible such that I can proceed with treatment planning. I discussed with them treating him NS crit of a manner as possible. Specifically, I discussed with them a potential 5 fraction course of radiation treatment.        ________________________________   Jodelle Gross, MD, PhD

## 2013-04-11 DIAGNOSIS — Z510 Encounter for antineoplastic radiation therapy: Secondary | ICD-10-CM | POA: Diagnosis not present

## 2013-04-11 NOTE — Progress Notes (Signed)
  Radiation Oncology         (336) [phone] ________________________________  Name: DOMENIQUE SOUTHERS MRN: [id number]  Date: 04/09/2013  DOB: July 10, 1945  Optical Surface Tracking Plan:  Since intensity modulated radiotherapy (IMRT) and 3D conformal radiation treatment methods are predicated on accurate and precise positioning for treatment, intrafraction motion monitoring is medically necessary to ensure accurate and safe treatment delivery.  The ability to quantify intrafraction motion without excessive ionizing radiation dose can only be performed with optical surface tracking. Accordingly, surface imaging offers the opportunity to obtain 3D measurements of patient position throughout IMRT and 3D treatments without excessive radiation exposure.  I am ordering optical surface tracking for this patient's upcoming course of radiotherapy. ________________________________  Marye Round, MD 04/11/2013 6:38 PM    Reference:   Ursula Alert, J, et al. Surface imaging-based analysis of intrafraction motion for breast radiotherapy patients.Journal of [place], n. 6, nov. 2014. ISSN [id number].   Available at: <http://www.jacmp.org/index.php/jacmp/article/view/4957>.

## 2013-04-11 NOTE — Progress Notes (Signed)
  Radiation Oncology         [phone]) [phone] ________________________________  Name: Victor Little MRN: [id number]  Date: 04/09/2013  DOB: [birth year]  SIMULATION AND TREATMENT PLANNING NOTE  DIAGNOSIS:  Rectal cancer  Site:  Pelvis  NARRATIVE:  The patient was brought to the [facility].  Identity was confirmed.  All relevant records and images related to the planned course of therapy were reviewed.   Written consent to proceed with treatment was confirmed which was freely given after reviewing the details related to the planned course of therapy had been reviewed with the patient.  Then, the patient was set-up in a stable reproducible  supine position for radiation therapy.  CT images were obtained.  Surface markings were placed.    Medically necessary complex treatment device(s) for immobilization:  Customized VAC lock bag.   The CT images were loaded into the planning software.  Then the target and avoidance structures were contoured.  Treatment planning then occurred.  The radiation prescription was entered and confirmed.  A total of 4 complex treatment devices were fabricated which relate to the designed radiation treatment fields. Each of these customized fields/ complex treatment devices will be used on a daily basis during the radiation course. I have requested : 3D Simulation  I have requested a DVH of the following structures: Gross tumor volume, bowel, femoral heads, bladder.   PLAN:  The patient will receive 25 Gy in 5 fractions. The patient is suitable for a hypo-fractionated palliative course of radiation. 25 Gy in 5 fractions is often used in [place], although typically in the preoperative setting. I have reviewed the BED of this regimen as compared to other published palliative courses of treatment and this is in keeping with a range of utilized courses of treatment and recommendations. There is some suggestion that a higher BED treatment may be helpful, but I would  not recommend a greater dose in this patient's case given the goals of care and the particulars of his case.  ________________________________   Jodelle Gross, MD, PhD

## 2013-04-15 ENCOUNTER — Ambulatory Visit (HOSPITAL_BASED_OUTPATIENT_CLINIC_OR_DEPARTMENT_OTHER): Payer: PRIVATE HEALTH INSURANCE | Admitting: Oncology

## 2013-04-15 ENCOUNTER — Encounter: Payer: Self-pay | Admitting: Oncology

## 2013-04-15 ENCOUNTER — Encounter: Payer: Self-pay | Admitting: *Deleted

## 2013-04-15 ENCOUNTER — Telehealth: Payer: Self-pay | Admitting: Oncology

## 2013-04-15 ENCOUNTER — Other Ambulatory Visit: Payer: PRIVATE HEALTH INSURANCE

## 2013-04-15 ENCOUNTER — Encounter: Payer: PRIVATE HEALTH INSURANCE | Admitting: Nutrition

## 2013-04-15 VITALS — BP 119/67 | HR 92 | Resp 18 | Ht 72.0 in | Wt 174.7 lb

## 2013-04-15 DIAGNOSIS — H913 Deaf nonspeaking, not elsewhere classified: Secondary | ICD-10-CM

## 2013-04-15 DIAGNOSIS — F79 Unspecified intellectual disabilities: Secondary | ICD-10-CM

## 2013-04-15 DIAGNOSIS — C20 Malignant neoplasm of rectum: Secondary | ICD-10-CM

## 2013-04-15 DIAGNOSIS — C787 Secondary malignant neoplasm of liver and intrahepatic bile duct: Secondary | ICD-10-CM

## 2013-04-15 NOTE — Progress Notes (Signed)
  [facility] OFFICE PROGRESS NOTE   Diagnosis: Rectal cancer  INTERVAL HISTORY:   Victor Little returns for followup of rectal cancer. His family is not present, but he is with a sign language interpreter. He denies rectal bleeding. He has mid back discomfort.  Objective:  Vital signs in last 24 hours:  Blood pressure 119/67, pulse 92, resp. rate 18, height 6' (1.829 m), weight 174 lb 11.2 oz (79.243 kg), SpO2 96.00%.    Lymphatics: No inguinal nodes Resp: Lungs clear bilaterally Cardio: Regular rate and rhythm GI: No hepatomegaly, no mass, nontender Vascular: No leg edema   Lab Results:  Lab Results  Component Value Date   WBC 8.9 03/20/2013   HGB 10.5* 03/20/2013   HCT 33.2* 03/20/2013   MCV 84.1 03/20/2013   PLT 273 03/20/2013   NEUTROABS 4.9 03/13/2013     Lab Results  Component Value Date   CEA 24.6* 03/16/2013    Medications: I have reviewed the patient's current medications.  Assessment/Plan:  1. Rectal cancer-metastatic, colonoscopy 03/16/2013 confirmed a rectal mass at 4 cm from the anal verge with a biopsy confirming adenocarcinoma .CT scan 03/14/2013 consistent with an enlarged perirectal lymph node and a liver metastasis  2. History of rectal bleeding secondary to #1  3. Deaf/mute  4. Mental retardation  Disposition:  Victor Little appears comfortable. I discussed the rectal cancer diagnosis with him again today with the aid of a sign language interpreter. He does not appear to understand the diagnosis. I agree with the plan for palliative radiation as scheduled by Dr. Lisbeth Renshaw. Victor Little is not a candidate for systemic chemotherapy with his limited mental capacity.  I discussed the case with his sister by telephone. She appears to understand the diagnosis and agrees with the plan for palliative radiation.  Victor Little will return for an office visit in medical oncology in 2 months.  Betsy Coder, MD  04/15/2013  5:15 PM

## 2013-04-15 NOTE — Progress Notes (Signed)
Clinical Social Worker met with pt and interpreter in the exam room to follow up and offer continued support.  No family members nor SNF representatives were present during the appointment.  Pt stated he was doing well, but his understanding to his treatment plan is questionable.  Physician did communicate with pt's sister in regards to the treatment plan.  CSW contacted [facility] to express concern for pt being left alone at the appointment.  CSW spoke with Tammy who stated it was the family's responsibility to attend all appointments with the pt; which has been communicated to the family.  Tammy stated she would contact the family again to reiterate the importance of their presence at appointments.  CSW informed Tammy of pt's appointment tomorrow, and she confirmed that someone (family member or SNF staff) would accompany the pt.  CSW requested the facility to call and confirm who would be attending the appointment with the pt tomorrow.  Tammy stated she would call CSW with the information once it was confirmed.    Johnnye Lana, MSW, [place] Worker [facility] [phone]

## 2013-04-15 NOTE — Telephone Encounter (Signed)
[facility] PT APPT SCHEUDLE FOR MAY. PT W/INTERPRETER.

## 2013-04-15 NOTE — Progress Notes (Signed)
Patient arrived from nursing home and was left in lobby with no one accompanying him.  The interpreter was present during entire visit with MD.  The sister, [name], was called by phone and MD discussed current plan of care with her.    SW was contacted and informed of above.  SW met with patient and interpreter.  SW will be contacting SNF re: future appointments.

## 2013-04-16 ENCOUNTER — Ambulatory Visit: Payer: PRIVATE HEALTH INSURANCE | Admitting: Radiation Oncology

## 2013-04-16 ENCOUNTER — Ambulatory Visit
Admission: RE | Admit: 2013-04-16 | Discharge: 2013-04-16 | Disposition: A | Discharge status: Home / Self Care | Payer: PRIVATE HEALTH INSURANCE | Source: Ambulatory Visit | Attending: Radiation Oncology | Admitting: Radiation Oncology

## 2013-04-16 ENCOUNTER — Encounter: Payer: Self-pay | Admitting: Radiation Oncology

## 2013-04-16 ENCOUNTER — Encounter: Payer: Self-pay | Admitting: *Deleted

## 2013-04-16 DIAGNOSIS — C787 Secondary malignant neoplasm of liver and intrahepatic bile duct: Principal | ICD-10-CM

## 2013-04-16 DIAGNOSIS — Z510 Encounter for antineoplastic radiation therapy: Secondary | ICD-10-CM | POA: Diagnosis not present

## 2013-04-16 DIAGNOSIS — C20 Malignant neoplasm of rectum: Secondary | ICD-10-CM

## 2013-04-16 NOTE — [facility] Oncology Navigator Note (Signed)
Patient came to radiation appointment today accompanied by caretaker from SNF.  Sign language interpreter was also present.  This RN and SW spoke with caretaker from SNF re: need to ensure patient is accompanied at each visit with a caretaker since family not present during all appointments.  This RN also reiterated to caretaker the need for staff to learn and be taught the side effects to monitor for while receiving radiation.  SW is going to contact SW at the [facility] re: above. Radiation nurse Thayer Headings is aware and will discuss side effects, etc. With caretaker at next visit.

## 2013-04-16 NOTE — Progress Notes (Signed)
Simulation Verification Note Pelvis  The patient was brought to the treatment unit and placed in the planned treatment position. The clinical setup was verified. Then port films were obtained and uploaded to the radiation oncology medical record software.  The treatment beams were carefully compared against the planned radiation fields. The position location and shape of the radiation fields was reviewed. They targeted volume of tissue appears to be appropriately covered by the radiation beams. Organs at risk appear to be excluded as planned.  Based on my personal review, I approved the simulation verification. The patient's treatment will proceed as planned.  -----------------------------------  Eppie Gibson, MD

## 2013-04-17 ENCOUNTER — Encounter: Payer: Self-pay | Admitting: *Deleted

## 2013-04-17 ENCOUNTER — Ambulatory Visit
Admission: RE | Admit: 2013-04-17 | Discharge: 2013-04-17 | Disposition: A | Discharge status: Home / Self Care | Payer: PRIVATE HEALTH INSURANCE | Source: Ambulatory Visit | Attending: Radiation Oncology | Admitting: Radiation Oncology

## 2013-04-17 DIAGNOSIS — Z510 Encounter for antineoplastic radiation therapy: Secondary | ICD-10-CM | POA: Diagnosis not present

## 2013-04-17 DIAGNOSIS — C787 Secondary malignant neoplasm of liver and intrahepatic bile duct: Principal | ICD-10-CM

## 2013-04-17 DIAGNOSIS — C20 Malignant neoplasm of rectum: Secondary | ICD-10-CM

## 2013-04-17 NOTE — Progress Notes (Signed)
Patient brought to nursing with CNA Lorine Bears, from the [facility] care facility , and Juliann Pulse, sign language interpreter, radiation and therapy and you book, and printed schedule given to Seth Bake, discussed possible  mild side effects, such   as fatigue, pain, skin irritation at patient's bottom where radiation treatment  Is being delivered, diarrhea, can use imodium ad as needed if diarrhea starts,  And discussed low fiber diet  In book, use baby wipes  If bottom becomes irritated, check patient's skin if he can convey bottom is irritated,( may become figity) use luke warm water when bathing, no rubbing scrubbing or scratching that area being treated, will get a one month  follow up appt card last visit to see Dr.moody will see Dr. Lisbeth Renshaw next Wednesday 04/23/2013 next to last day of treatment, verbal understanding from CNA,Andrea, sin interprter conveyed same to the p[ataient, he just smiled and shook my hand, unable to asess if he understood 1:36 PM
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
## 2013-04-18 ENCOUNTER — Ambulatory Visit
Admission: RE | Admit: 2013-04-18 | Discharge: 2013-04-18 | Disposition: A | Discharge status: Home / Self Care | Payer: PRIVATE HEALTH INSURANCE | Source: Ambulatory Visit | Attending: Radiation Oncology | Admitting: Radiation Oncology

## 2013-04-18 DIAGNOSIS — Z510 Encounter for antineoplastic radiation therapy: Secondary | ICD-10-CM | POA: Diagnosis not present

## 2013-04-21 ENCOUNTER — Ambulatory Visit
Admission: RE | Admit: 2013-04-21 | Discharge: 2013-04-21 | Disposition: A | Discharge status: Home / Self Care | Payer: PRIVATE HEALTH INSURANCE | Source: Ambulatory Visit | Attending: Radiation Oncology | Admitting: Radiation Oncology

## 2013-04-21 DIAGNOSIS — Z510 Encounter for antineoplastic radiation therapy: Secondary | ICD-10-CM | POA: Diagnosis not present

## 2013-04-22 ENCOUNTER — Ambulatory Visit
Admission: RE | Admit: 2013-04-22 | Discharge: 2013-04-22 | Disposition: A | Discharge status: Home / Self Care | Payer: PRIVATE HEALTH INSURANCE | Source: Ambulatory Visit | Attending: Radiation Oncology | Admitting: Radiation Oncology

## 2013-04-22 DIAGNOSIS — Z510 Encounter for antineoplastic radiation therapy: Secondary | ICD-10-CM | POA: Diagnosis not present

## 2013-04-23 ENCOUNTER — Ambulatory Visit
Admission: RE | Admit: 2013-04-23 | Discharge: 2013-04-23 | Disposition: A | Discharge status: Home / Self Care | Payer: PRIVATE HEALTH INSURANCE | Source: Ambulatory Visit | Attending: Radiation Oncology | Admitting: Radiation Oncology

## 2013-04-23 ENCOUNTER — Encounter: Payer: Self-pay | Admitting: Radiation Oncology

## 2013-04-23 VITALS — BP 111/62 | HR 86 | Temp 97.3°F | Resp 20

## 2013-04-23 DIAGNOSIS — Z510 Encounter for antineoplastic radiation therapy: Secondary | ICD-10-CM | POA: Diagnosis not present

## 2013-04-23 DIAGNOSIS — C787 Secondary malignant neoplasm of liver and intrahepatic bile duct: Principal | ICD-10-CM

## 2013-04-23 DIAGNOSIS — C20 Malignant neoplasm of rectum: Secondary | ICD-10-CM

## 2013-04-23 NOTE — Progress Notes (Signed)
   Department of Radiation Oncology  Phone:  [phone] Fax:        [phone]  Weekly Treatment Note    Name: Victor Little Date: 04/23/2013 MRN: [id number] DOB: [birth year]   Current dose: 25 Gy  Current fraction: 5   MEDICATIONS: Current Outpatient Prescriptions  Medication Sig Dispense Refill  . albuterol (PROVENTIL) (5 MG/ML) 0.5% nebulizer solution Take 0.63 mg by nebulization every 2 (two) hours as needed for wheezing or shortness of breath. 0.63mg /84ml      . benazepril (LOTENSIN) 5 MG tablet Take 5 mg by mouth daily.      . feeding supplement, ENSURE, (ENSURE) PUDG Take 1 Container by mouth daily.  30 Container  11  . ferrous sulfate 325 (65 FE) MG tablet Take 325 mg by mouth daily with breakfast.      . furosemide (LASIX) 20 MG tablet Take 20 mg by mouth daily.      Marland Kitchen gabapentin (NEURONTIN) 300 MG capsule Take 1 capsule by mouth 3 (three) times daily.      Marland Kitchen loratadine (CLARITIN) 10 MG tablet Take 10 mg by mouth daily.      . metoprolol tartrate (LOPRESSOR) 25 MG tablet Take 25 mg by mouth daily.      . mirtazapine (REMERON) 7.5 MG tablet Take 7.5 mg by mouth at bedtime.      . Multiple Vitamin (MULTIVITAMIN WITH MINERALS) TABS tablet Take 1 tablet by mouth daily.      Marland Kitchen PRESCRIPTION MEDICATION Take 120 mLs by mouth 3 (three) times daily. Med Plus      . Probiotic Product (PROBIOTIC FORMULA PO) Take 1 capsule by mouth daily.      Marland Kitchen saccharomyces boulardii (FLORASTOR) 250 MG capsule Take 250 mg by mouth every morning.      . traMADol (ULTRAM) 50 MG tablet Take 1 tablet (50 mg total) by mouth every 6 (six) hours as needed.  30 tablet  0   No current facility-administered medications for this encounter.     ALLERGIES: Review of patient's allergies indicates no known allergies.   LABORATORY DATA:  Lab Results  Component Value Date   WBC 8.9 03/20/2013   HGB 10.5* 03/20/2013   HCT 33.2* 03/20/2013   MCV 84.1 03/20/2013   PLT 273 03/20/2013   Lab Results  Component  Value Date   NA 142 03/20/2013   K 3.9 03/20/2013   CL 107 03/20/2013   CO2 26 03/20/2013   Lab Results  Component Value Date   ALT 13 12/27/2012   AST 22 12/27/2012   ALKPHOS 107 12/27/2012   BILITOT 0.3 12/27/2012     NARRATIVE: Victor Little was seen today for weekly treatment management. The chart was checked and the patient's films were reviewed. The patient finished his final treatment today. He did well. No complaints of pelvic pain. No further bleeding. The patient was seen today with the aid of an interpreter.  PHYSICAL EXAMINATION: oral temperature is 97.3 F (36.3 C). His blood pressure is 111/62 and his pulse is 86. His respiration is 20.        ASSESSMENT: The patient is doing satisfactorily with treatment.  PLAN: We will continue with the patient's radiation treatment as planned.
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
## 2013-05-19 ENCOUNTER — Encounter: Payer: Self-pay | Admitting: Radiation Oncology

## 2013-05-28 ENCOUNTER — Ambulatory Visit
Admission: RE | Admit: 2013-05-28 | Discharge: 2013-05-28 | Disposition: A | Discharge status: Home / Self Care | Payer: PRIVATE HEALTH INSURANCE | Source: Ambulatory Visit | Attending: Radiation Oncology | Admitting: Radiation Oncology

## 2013-05-28 ENCOUNTER — Encounter: Payer: Self-pay | Admitting: Radiation Oncology

## 2013-05-28 ENCOUNTER — Telehealth: Payer: Self-pay | Admitting: *Deleted

## 2013-05-28 VITALS — BP 123/75 | HR 71 | Temp 97.4°F | Resp 20 | Wt 180.0 lb

## 2013-05-28 DIAGNOSIS — C787 Secondary malignant neoplasm of liver and intrahepatic bile duct: Principal | ICD-10-CM

## 2013-05-28 DIAGNOSIS — C20 Malignant neoplasm of rectum: Secondary | ICD-10-CM

## 2013-05-28 HISTORY — DX: Personal history of irradiation: Z92.3
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
## 2013-05-28 NOTE — Telephone Encounter (Signed)
Santiago Glad dillingham,scheduler to reschedule follow up appt  1:48 PM

## 2013-05-28 NOTE — Telephone Encounter (Signed)
Called pataient home, sister Dimple Nanas, "No he isn't here,he is at [facility], ", thanked her sister, will e-mail Dr.Moody and Earleen Newport, scheduler to reschedule patient's follow up 1:42 PM
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
## 2013-06-12 ENCOUNTER — Encounter: Payer: Self-pay | Admitting: *Deleted

## 2013-06-12 ENCOUNTER — Ambulatory Visit (HOSPITAL_BASED_OUTPATIENT_CLINIC_OR_DEPARTMENT_OTHER): Payer: PRIVATE HEALTH INSURANCE | Admitting: Oncology

## 2013-06-12 ENCOUNTER — Telehealth: Payer: Self-pay | Admitting: Oncology

## 2013-06-12 VITALS — BP 126/68 | HR 69 | Temp 97.8°F | Resp 18 | Ht 72.0 in | Wt 184.7 lb

## 2013-06-12 DIAGNOSIS — C20 Malignant neoplasm of rectum: Secondary | ICD-10-CM

## 2013-06-12 DIAGNOSIS — C787 Secondary malignant neoplasm of liver and intrahepatic bile duct: Secondary | ICD-10-CM

## 2013-06-12 NOTE — Telephone Encounter (Signed)
gv aide appt schedule for july. confirmed w/desk nurse f/u should be 43mos.

## 2013-06-12 NOTE — Progress Notes (Signed)
  Victor Little OFFICE PROGRESS NOTE   Diagnosis: Rectal cancer  INTERVAL HISTORY:   He returns as scheduled. He is with a caretaker from the nursing center and a sign language interpreter. He completed palliative radiation to the rectum on 04/23/2013. He continues to have bleeding with bowel movements. Mild discomfort at the left low lateral back. Good appetite. No other complaint.    Objective:  Vital signs in last 24 hours:  Blood pressure 126/68, pulse 69, temperature 97.8 F (36.6 C), temperature source Oral, resp. rate 18, height 6' (1.829 m), weight 184 lb 11.2 oz (83.779 kg).    HEENT: Conjunctivae are pink Lymphatics: No cervical or supraclavicular nodes Resp: Lungs clear bilaterally, no respiratory distress Cardio: Regular rate and rhythm GI: No hepatomegaly, nontender, no mass Vascular: Trace edema at the left greater than right leg below the knee. No tenderness Neuro: Alert, follows commands      Lab Results:  Lab Results  Component Value Date   WBC 8.9 03/20/2013   HGB 10.5* 03/20/2013   HCT 33.2* 03/20/2013   MCV 84.1 03/20/2013   PLT 273 03/20/2013   NEUTROABS 4.9 03/13/2013     Imaging:  No results found.  Medications: I have reviewed the patient's current medications.  Assessment/Plan: 1. Rectal cancer-metastatic, colonoscopy 03/16/2013 confirmed a rectal mass at 4 cm from the anal verge with a biopsy confirming adenocarcinoma .CT scan 03/14/2013 consistent with an enlarged perirectal lymph node and a liver metastasis  2. History of rectal bleeding secondary to #1   Status post palliative radiation to the rectum completed 04/23/2013 3. Deaf/mute  4. Mental retardation  5. mild swelling of the left lower leg-asymptomatic, I recommend the nursing home staff observe the left leg and consider a Doppler for persistent edema   Disposition:  Victor Little appears stable. The plan is to follow him with an observation approach. He will return for an  office visit in 2 months. We will see him sooner as needed.  Ladell Pier, MD  06/12/2013  11:49 AM

## 2013-06-12 NOTE — Progress Notes (Signed)
[place] Work  Clinical Social Work met briefly with pt for reassessment of psychosocial needs at his appointment today.  Pt had a CNA from [employer] and sign language interpreter with him for his appointment today.  Pt appeared in good spirits today and seemed happy to see CSW. According to CNA, no plans to d/c pt from SNF currently. No needs noted for CSW today and he had proper assistance with him today for his appointment.     Clinical Social Work interventions: Reassessment of needs  Loren Racer, LCSW Clinical Social Worker [facility] for [place] Wednesday, Thursday and Friday Phone: [phone] Fax: [phone]

## 2013-08-05 ENCOUNTER — Ambulatory Visit: Payer: PRIVATE HEALTH INSURANCE | Admitting: Nurse Practitioner

## 2013-10-31 NOTE — Addendum Note (Signed)
Encounter addended by: Marye Round, MD on: 10/31/2013 10:18 AM<BR>     Documentation filed: Notes Section, Visit Diagnoses

## 2013-10-31 NOTE — Progress Notes (Signed)
  Radiation Oncology         (336) [phone] ________________________________  Name: Victor Little MRN: [id number]  Date: 04/16/2013  DOB: 1945/09/11  Simulation Verification Note   NARRATIVE: The patient was brought to the treatment unit and placed in the planned treatment position. The clinical setup was verified. Then port films were obtained and uploaded to the radiation oncology medical record software.  The treatment beams were carefully compared against the planned radiation fields. The position, location, and shape of the radiation fields was reviewed. The targeted volume of tissue appears to be appropriately covered by the radiation beams. Based on my personal review, I approved the simulation verification. The patient's treatment will proceed as planned.  ________________________________   Jodelle Gross, MD, PhD

## 2013-10-31 NOTE — Progress Notes (Signed)
  Radiation Oncology         [phone]) [phone] ________________________________  Name: Victor Little MRN: [id number]  Date: 04/23/2013  DOB: [birth year]  End of Treatment Note  Diagnosis:   Rectal cancer     Indication for treatment:  Palliative       Radiation treatment dates:    04/17/2013 seen through 04/23/2013  Site/dose:   The patient was treated to the rectal/pelvic region in a hypo-fractionated manner to a dose of 25 gray in 5 fractions. This was completed using a 4 field 3-D conformal technique.  Narrative: The patient tolerated radiation treatment relatively well.     Plan: The patient has completed radiation treatment. The patient will return to radiation oncology clinic for routine followup in one month. I advised the patient to call or return sooner if they have any questions or concerns related to their recovery or treatment. ________________________________  Jodelle Gross, M.D., Ph.D.

## 2014-02-25 ENCOUNTER — Emergency Department (HOSPITAL_COMMUNITY): Payer: Medicare Other

## 2014-02-25 ENCOUNTER — Inpatient Hospital Stay (HOSPITAL_COMMUNITY)
Admission: EM | Admit: 2014-02-25 | Discharge: 2014-03-04 | DRG: 871 | Disposition: A | Discharge status: Hospice | Payer: Medicare Other | Attending: Family Medicine | Admitting: Family Medicine

## 2014-02-25 ENCOUNTER — Encounter (HOSPITAL_COMMUNITY): Payer: Self-pay

## 2014-02-25 ENCOUNTER — Inpatient Hospital Stay (HOSPITAL_COMMUNITY): Payer: Medicare Other

## 2014-02-25 DIAGNOSIS — A419 Sepsis, unspecified organism: Secondary | ICD-10-CM | POA: Diagnosis present

## 2014-02-25 DIAGNOSIS — N390 Urinary tract infection, site not specified: Secondary | ICD-10-CM | POA: Diagnosis present

## 2014-02-25 DIAGNOSIS — Z66 Do not resuscitate: Secondary | ICD-10-CM | POA: Diagnosis present

## 2014-02-25 DIAGNOSIS — E872 Acidosis: Secondary | ICD-10-CM | POA: Diagnosis present

## 2014-02-25 DIAGNOSIS — N1330 Unspecified hydronephrosis: Secondary | ICD-10-CM | POA: Diagnosis present

## 2014-02-25 DIAGNOSIS — J9601 Acute respiratory failure with hypoxia: Secondary | ICD-10-CM | POA: Diagnosis present

## 2014-02-25 DIAGNOSIS — C779 Secondary and unspecified malignant neoplasm of lymph node, unspecified: Secondary | ICD-10-CM | POA: Diagnosis present

## 2014-02-25 DIAGNOSIS — Z79899 Other long term (current) drug therapy: Secondary | ICD-10-CM

## 2014-02-25 DIAGNOSIS — I10 Essential (primary) hypertension: Secondary | ICD-10-CM | POA: Diagnosis present

## 2014-02-25 DIAGNOSIS — E46 Unspecified protein-calorie malnutrition: Secondary | ICD-10-CM | POA: Diagnosis present

## 2014-02-25 DIAGNOSIS — F79 Unspecified intellectual disabilities: Secondary | ICD-10-CM | POA: Diagnosis present

## 2014-02-25 DIAGNOSIS — N179 Acute kidney failure, unspecified: Secondary | ICD-10-CM

## 2014-02-25 DIAGNOSIS — B9620 Unspecified Escherichia coli [E. coli] as the cause of diseases classified elsewhere: Secondary | ICD-10-CM | POA: Diagnosis present

## 2014-02-25 DIAGNOSIS — R579 Shock, unspecified: Secondary | ICD-10-CM | POA: Insufficient documentation

## 2014-02-25 DIAGNOSIS — R911 Solitary pulmonary nodule: Secondary | ICD-10-CM | POA: Insufficient documentation

## 2014-02-25 DIAGNOSIS — H9190 Unspecified hearing loss, unspecified ear: Secondary | ICD-10-CM

## 2014-02-25 DIAGNOSIS — D630 Anemia in neoplastic disease: Secondary | ICD-10-CM | POA: Diagnosis present

## 2014-02-25 DIAGNOSIS — I959 Hypotension, unspecified: Secondary | ICD-10-CM | POA: Diagnosis present

## 2014-02-25 DIAGNOSIS — R000 Tachycardia, unspecified: Secondary | ICD-10-CM

## 2014-02-25 DIAGNOSIS — R509 Fever, unspecified: Secondary | ICD-10-CM

## 2014-02-25 DIAGNOSIS — D509 Iron deficiency anemia, unspecified: Secondary | ICD-10-CM | POA: Diagnosis present

## 2014-02-25 DIAGNOSIS — C787 Secondary malignant neoplasm of liver and intrahepatic bile duct: Secondary | ICD-10-CM | POA: Diagnosis present

## 2014-02-25 DIAGNOSIS — R0602 Shortness of breath: Secondary | ICD-10-CM

## 2014-02-25 DIAGNOSIS — C7801 Secondary malignant neoplasm of right lung: Secondary | ICD-10-CM | POA: Diagnosis present

## 2014-02-25 DIAGNOSIS — Z923 Personal history of irradiation: Secondary | ICD-10-CM | POA: Diagnosis not present

## 2014-02-25 DIAGNOSIS — R4701 Aphasia: Secondary | ICD-10-CM | POA: Diagnosis present

## 2014-02-25 DIAGNOSIS — E860 Dehydration: Secondary | ICD-10-CM | POA: Diagnosis present

## 2014-02-25 DIAGNOSIS — C7802 Secondary malignant neoplasm of left lung: Secondary | ICD-10-CM | POA: Diagnosis present

## 2014-02-25 DIAGNOSIS — R6520 Severe sepsis without septic shock: Secondary | ICD-10-CM | POA: Diagnosis present

## 2014-02-25 DIAGNOSIS — N170 Acute kidney failure with tubular necrosis: Secondary | ICD-10-CM | POA: Diagnosis present

## 2014-02-25 DIAGNOSIS — H913 Deaf nonspeaking, not elsewhere classified: Secondary | ICD-10-CM | POA: Diagnosis present

## 2014-02-25 DIAGNOSIS — C20 Malignant neoplasm of rectum: Secondary | ICD-10-CM | POA: Diagnosis present

## 2014-02-25 DIAGNOSIS — C781 Secondary malignant neoplasm of mediastinum: Secondary | ICD-10-CM | POA: Diagnosis present

## 2014-02-25 DIAGNOSIS — R531 Weakness: Secondary | ICD-10-CM | POA: Diagnosis present

## 2014-02-25 DIAGNOSIS — Z87898 Personal history of other specified conditions: Secondary | ICD-10-CM | POA: Insufficient documentation

## 2014-02-25 DIAGNOSIS — F09 Unspecified mental disorder due to known physiological condition: Secondary | ICD-10-CM

## 2014-02-25 DIAGNOSIS — A047 Enterocolitis due to Clostridium difficile: Secondary | ICD-10-CM | POA: Diagnosis present

## 2014-02-25 DIAGNOSIS — Z515 Encounter for palliative care: Secondary | ICD-10-CM

## 2014-02-25 DIAGNOSIS — L8990 Pressure ulcer of unspecified site, unspecified stage: Secondary | ICD-10-CM | POA: Insufficient documentation

## 2014-02-25 DIAGNOSIS — Z8619 Personal history of other infectious and parasitic diseases: Secondary | ICD-10-CM | POA: Insufficient documentation

## 2014-02-25 LAB — COMPREHENSIVE METABOLIC PANEL
ALT: 39 U/L (ref 0–53)
ANION GAP: 12 (ref 5–15)
AST: 69 U/L — ABNORMAL HIGH (ref 0–37)
Albumin: 2.8 g/dL — ABNORMAL LOW (ref 3.5–5.2)
Alkaline Phosphatase: 453 U/L — ABNORMAL HIGH (ref 39–117)
BILIRUBIN TOTAL: 0.9 mg/dL (ref 0.3–1.2)
BUN: 53 mg/dL — ABNORMAL HIGH (ref 6–23)
CHLORIDE: 103 mmol/L (ref 96–112)
CO2: 28 mmol/L (ref 19–32)
Calcium: 8.6 mg/dL (ref 8.4–10.5)
Creatinine, Ser: 1.74 mg/dL — ABNORMAL HIGH (ref 0.50–1.35)
GFR calc Af Amer: 45 mL/min — ABNORMAL LOW (ref >90)
GFR, EST NON AFRICAN AMERICAN: 39 mL/min — AB (ref >90)
Glucose, Bld: 94 mg/dL (ref 70–99)
POTASSIUM: 4.3 mmol/L (ref 3.5–5.1)
SODIUM: 143 mmol/L (ref 135–145)
TOTAL PROTEIN: 8.4 g/dL — AB (ref 6.0–8.3)

## 2014-02-25 LAB — CBC WITH DIFFERENTIAL/PLATELET
BASOS ABS: 0 10*3/uL (ref 0.0–0.1)
BASOS PCT: 0 % (ref 0–1)
EOS ABS: 0.1 10*3/uL (ref 0.0–0.7)
EOS PCT: 1 % (ref 0–5)
HEMATOCRIT: 35.3 % — AB (ref 39.0–52.0)
Hemoglobin: 10.7 g/dL — ABNORMAL LOW (ref 13.0–17.0)
LYMPHS ABS: 1.4 10*3/uL (ref 0.7–4.0)
LYMPHS PCT: 9 % — AB (ref 12–46)
MCH: 23.2 pg — ABNORMAL LOW (ref 26.0–34.0)
MCHC: 30.3 g/dL (ref 30.0–36.0)
MCV: 76.6 fL — AB (ref 78.0–100.0)
MONO ABS: 1.1 10*3/uL — AB (ref 0.1–1.0)
Monocytes Relative: 7 % (ref 3–12)
Neutro Abs: 12.2 10*3/uL — ABNORMAL HIGH (ref 1.7–7.7)
Neutrophils Relative %: 83 % — ABNORMAL HIGH (ref 43–77)
PLATELETS: 488 10*3/uL — AB (ref 150–400)
RBC: 4.61 MIL/uL (ref 4.22–5.81)
RDW: 17.6 % — ABNORMAL HIGH (ref 11.5–15.5)
WBC: 14.9 10*3/uL — AB (ref 4.0–10.5)

## 2014-02-25 LAB — URINALYSIS, ROUTINE W REFLEX MICROSCOPIC
GLUCOSE, UA: NEGATIVE mg/dL
KETONES UR: NEGATIVE mg/dL
Nitrite: POSITIVE — AB
PROTEIN: 30 mg/dL — AB
Specific Gravity, Urine: 1.018 (ref 1.005–1.030)
Urobilinogen, UA: 1 mg/dL (ref 0.0–1.0)
pH: 5 (ref 5.0–8.0)

## 2014-02-25 LAB — URINE MICROSCOPIC-ADD ON

## 2014-02-25 LAB — MRSA PCR SCREENING: MRSA by PCR: NEGATIVE

## 2014-02-25 LAB — I-STAT CG4 LACTIC ACID, ED
Lactic Acid, Venous: 3.37 mmol/L (ref 0.5–2.0)
Lactic Acid, Venous: 2.49 mmol/L (ref 0.5–2.0)

## 2014-02-25 MED ORDER — DIVALPROEX SODIUM 125 MG PO DR TAB
125.0000 mg | DELAYED_RELEASE_TABLET | Freq: Every day | ORAL | Status: DC
  Administered 2014-02-25 – 2014-03-03 (×7): 125 mg via ORAL
  Filled 2014-02-25 (×9): qty 1

## 2014-02-25 MED ORDER — ALPRAZOLAM 0.25 MG PO TABS
0.2500 mg | ORAL_TABLET | Freq: Three times a day (TID) | ORAL | Status: DC
  Administered 2014-02-25 – 2014-03-04 (×16): 0.25 mg via ORAL
  Filled 2014-02-25 (×18): qty 1

## 2014-02-25 MED ORDER — CEFEPIME HCL 2 G IJ SOLR
2.0000 g | Freq: Once | INTRAMUSCULAR | Status: AC
  Administered 2014-02-25: 2 g via INTRAVENOUS
  Filled 2014-02-25: qty 2

## 2014-02-25 MED ORDER — SODIUM CHLORIDE 0.9 % IV BOLUS (SEPSIS)
2500.0000 mL | Freq: Once | INTRAVENOUS | Status: AC
  Administered 2014-02-25: 2500 mL via INTRAVENOUS

## 2014-02-25 MED ORDER — ALBUTEROL SULFATE (2.5 MG/3ML) 0.083% IN NEBU
2.5000 mg | INHALATION_SOLUTION | RESPIRATORY_TRACT | Status: DC | PRN
  Administered 2014-03-03: 2.5 mg via RESPIRATORY_TRACT
  Filled 2014-02-25: qty 3

## 2014-02-25 MED ORDER — SODIUM CHLORIDE 0.9 % IJ SOLN
3.0000 mL | Freq: Two times a day (BID) | INTRAMUSCULAR | Status: DC
  Administered 2014-02-25 – 2014-03-04 (×7): 3 mL via INTRAVENOUS

## 2014-02-25 MED ORDER — LORATADINE 10 MG PO TABS
10.0000 mg | ORAL_TABLET | Freq: Every day | ORAL | Status: DC
  Administered 2014-02-26 – 2014-03-04 (×7): 10 mg via ORAL
  Filled 2014-02-25 (×7): qty 1

## 2014-02-25 MED ORDER — ONDANSETRON HCL 4 MG PO TABS: 4.0000 mg | ORAL_TABLET | Freq: Four times a day (QID) | ORAL | Status: DC | PRN

## 2014-02-25 MED ORDER — ADULT MULTIVITAMIN W/MINERALS CH
1.0000 | ORAL_TABLET | Freq: Every day | ORAL | Status: DC
Start: 2014-02-25 — End: 2014-03-04
  Administered 2014-02-25 – 2014-03-04 (×8): 1 via ORAL
  Filled 2014-02-25 (×8): qty 1

## 2014-02-25 MED ORDER — SACCHAROMYCES BOULARDII 250 MG PO CAPS
250.0000 mg | ORAL_CAPSULE | Freq: Every morning | ORAL | Status: DC
  Administered 2014-02-26 – 2014-03-03 (×6): 250 mg via ORAL
  Filled 2014-02-25 (×6): qty 1

## 2014-02-25 MED ORDER — GABAPENTIN 300 MG PO CAPS
300.0000 mg | ORAL_CAPSULE | Freq: Three times a day (TID) | ORAL | Status: DC
Start: 2014-02-25 — End: 2014-03-04
  Administered 2014-02-25 – 2014-03-04 (×19): 300 mg via ORAL
  Filled 2014-02-25 (×23): qty 1

## 2014-02-25 MED ORDER — DEXTROSE 5 % IV SOLN
2.0000 g | INTRAVENOUS | Status: DC
  Administered 2014-02-26 – 2014-02-27 (×2): 2 g via INTRAVENOUS
  Filled 2014-02-25 (×3): qty 2

## 2014-02-25 MED ORDER — ONDANSETRON HCL 4 MG/2ML IJ SOLN
4.0000 mg | Freq: Four times a day (QID) | INTRAMUSCULAR | Status: DC | PRN
  Administered 2014-03-02: 4 mg via INTRAVENOUS
  Filled 2014-02-25: qty 2

## 2014-02-25 MED ORDER — ACETAMINOPHEN 650 MG RE SUPP: 650.0000 mg | Freq: Four times a day (QID) | RECTAL | Status: DC | PRN

## 2014-02-25 MED ORDER — VANCOMYCIN HCL IN DEXTROSE 750-5 MG/150ML-% IV SOLN
750.0000 mg | Freq: Two times a day (BID) | INTRAVENOUS | Status: DC
  Administered 2014-02-25 – 2014-02-26 (×3): 750 mg via INTRAVENOUS
  Filled 2014-02-25 (×5): qty 150

## 2014-02-25 MED ORDER — TRAMADOL HCL 50 MG PO TABS
50.0000 mg | ORAL_TABLET | Freq: Four times a day (QID) | ORAL | Status: DC | PRN
  Administered 2014-02-27 – 2014-03-02 (×3): 50 mg via ORAL
  Filled 2014-02-25 (×3): qty 1

## 2014-02-25 MED ORDER — ACETAMINOPHEN 325 MG PO TABS
650.0000 mg | ORAL_TABLET | Freq: Four times a day (QID) | ORAL | Status: DC | PRN
  Administered 2014-02-27 – 2014-03-02 (×5): 650 mg via ORAL
  Filled 2014-02-25 (×5): qty 2

## 2014-02-25 MED ORDER — VANCOMYCIN HCL IN DEXTROSE 1-5 GM/200ML-% IV SOLN
1000.0000 mg | Freq: Once | INTRAVENOUS | Status: AC
  Administered 2014-02-25: 1000 mg via INTRAVENOUS
  Filled 2014-02-25: qty 200

## 2014-02-25 MED ORDER — ENOXAPARIN SODIUM 40 MG/0.4ML ~~LOC~~ SOLN
40.0000 mg | SUBCUTANEOUS | Status: DC
  Administered 2014-02-25 – 2014-03-04 (×7): 40 mg via SUBCUTANEOUS
  Filled 2014-02-25 (×10): qty 0.4

## 2014-02-25 MED ORDER — SODIUM CHLORIDE 0.9 % IV SOLN
INTRAVENOUS | Status: DC
  Administered 2014-02-25 – 2014-02-26 (×2): via INTRAVENOUS

## 2014-02-25 NOTE — H&P (Addendum)
History and Physical  LARSON LIMONES BPZ:[id number] DOB: 08/25/1945 DOA: 02/25/2014  Referring physician: Dr. Noemi Chapel, EDP PCP: [name], MD  Outpatient Specialists:  1. Oncology: Dr. Dominica Severin B. Sherrill 2. Radiation Oncology: Dr. Kyung [name]  Chief Complaint: Fall at SNF.  HPI: Victor Little is a 69 y.o. male with history of metastatic rectal cancer, with enlarged perirectal lymph node and liver metastasis, s/p palliative XRT and was on observation by Oncology, rectal bleeding secondary to cancer, deaf, mute, mental retardation, HTN, C. difficile, anemia sent from [facility] status post unwitnessed fall and generalized weakness. No history available from patient secondary to deaf/mute and mental retardation. Unable to reach patient's sister/health care power of attorney. History obtained from discussion with EDP. As per report, patient apparently fell this morning while trying to get off the toilet. No injuries were noted. Facility reported patient's mental status is at baseline. As per paramedics, patient was tachycardic. In the ED, noted to be hypotensive, tachycardic and hypoxic. Chest x-ray not suggestive of pneumonia. In and out urinary catheterization revealed cloudy urine with sediments. Sepsis protocol was initiated and patient has thus far received 2.5 L of IV fluids, IV cefepime and vancomycin. Systolic blood pressures have improved in the low 90s. Urine microscopy suggestive of UTI. Hospitalist admission requested.   Review of Systems: All systems reviewed and apart from history of presenting illness, are negative.  Past Medical History  Diagnosis Date  . Mental retardation   . Organic brain syndrome   . Hypertension   . Deaf   . Clostridium difficile colitis   . Pneumonia   . Cellulitis   . Deaf   . Mutism   . Neuropathy   . Intra-abdominal abscess 12/19/2012  . Pneumoperitoneum of unknown etiology 12/19/2012  . Colon cancer 03/16/13    adenoma  carcinomainvasive,mets to liver  . Anemia     iron deficiency  . Status post radiation therapy 04/17/13-04/23/13    pelvis 25Gy/5 fx   Past Surgical History  Procedure Laterality Date  . Orif right patella  09/23/2005  . Removal of right patella hardware  01/26/2006  . Colonoscopy N/A 03/16/2013    Procedure: COLONOSCOPY;  Surgeon: Beryle Beams, MD;  Location: [facility];  Service: Endoscopy;  Laterality: N/A;   Social History:  reports that he has never smoked. He has never used smokeless tobacco. He reports that he does not drink alcohol or use illicit drugs. Unable to obtain details from patient secondary to noncommunicative status.  No Known Allergies  History reviewed. No pertinent family history. Unable to obtain details from patient secondary to noncommunicative status.   Prior to Admission medications   Medication Sig Start Date End Date Taking? Authorizing Provider  ALPRAZolam (XANAX) 0.25 MG tablet Take 0.25 mg by mouth 3 (three) times daily.   Yes Historical Provider, MD  benazepril (LOTENSIN) 5 MG tablet Take 5 mg by mouth daily.   Yes Historical Provider, MD  divalproex (DEPAKOTE) 125 MG DR tablet Take 125 mg by mouth at bedtime.   Yes Historical Provider, MD  ferrous sulfate 325 (65 FE) MG tablet Take 325 mg by mouth 2 (two) times daily with a meal.    Yes Historical Provider, MD  furosemide (LASIX) 20 MG tablet Take 20 mg by mouth daily.   Yes Historical Provider, MD  gabapentin (NEURONTIN) 300 MG capsule Take 1 capsule by mouth 3 (three) times daily. 11/16/12  Yes Historical Provider, MD  loratadine (CLARITIN) 10  MG tablet Take 10 mg by mouth daily.   Yes Historical Provider, MD  metoprolol tartrate (LOPRESSOR) 25 MG tablet Take 25 mg by mouth daily.   Yes Historical Provider, MD  Multiple Vitamin (MULTIVITAMIN WITH MINERALS) TABS tablet Take 1 tablet by mouth daily.   Yes Historical Provider, MD  PRESCRIPTION MEDICATION Take 240 mLs by mouth 3 (three) times daily. Med Plus    Yes Historical Provider, MD  saccharomyces boulardii (FLORASTOR) 250 MG capsule Take 250 mg by mouth every morning.   Yes Historical Provider, MD  traMADol (ULTRAM) 50 MG tablet Take 1 tablet (50 mg total) by mouth every 6 (six) hours as needed. 03/20/13  Yes Kinnie Feil, MD  albuterol (PROVENTIL) (5 MG/ML) 0.5% nebulizer solution Take 0.63 mg by nebulization every 2 (two) hours as needed for wheezing or shortness of breath. 0.63mg /30ml 12/27/12   Theodis Blaze, MD   Physical Exam: Filed Vitals:   02/25/14 1130 02/25/14 1142 02/25/14 1145 02/25/14 1200  BP:   86/48 91/75  Pulse:   128 131  Temp: 98.2 F (36.8 C)     TempSrc: Rectal     Resp:   22 23  Height:  6' (1.829 m)    Weight:      SpO2:   98% 97%     General exam: Moderately built and thinly nourished pleasant middle-aged male patient, lying comfortably supine on the gurney in no obvious distress.  Head, eyes and ENT: Nontraumatic and normocephalic. Pupils equally reacting to light and accommodation. Oral mucosa extremely dry.  Neck: Supple. No JVD, carotid bruit or thyromegaly.  Lymphatics: No lymphadenopathy.  Respiratory system: Clear to auscultation. No increased work of breathing.  Cardiovascular system: S1 and S2 heard, regular tachycardic. No JVD, murmurs, gallops, clicks or pedal edema. Telemetry: Sinus tachycardia.  Gastrointestinal system: Abdomen is nondistended, soft and nontender. Normal bowel sounds heard. No organomegaly or masses appreciated.  Central nervous system: Alert and follows instructions appropriately. Mute. No focal neurological deficits.  Extremities: Symmetric 5 x 5 power. Peripheral pulses symmetrically felt.   Skin: No rashes or acute findings.  Musculoskeletal system: Negative exam.  Psychiatry: Pleasant and cooperative.   Labs on Admission:  Basic Metabolic Panel:  Recent Labs Lab 02/25/14 1012  NA 143  K 4.3  CL 103  CO2 28  GLUCOSE 94  BUN 53*  CREATININE 1.74*    CALCIUM 8.6   Liver Function Tests:  Recent Labs Lab 02/25/14 1012  AST 69*  ALT 39  ALKPHOS 453*  BILITOT 0.9  PROT 8.4*  ALBUMIN 2.8*   No results for input(s): LIPASE, AMYLASE in the last 168 hours. No results for input(s): AMMONIA in the last 168 hours. CBC:  Recent Labs Lab 02/25/14 1012  WBC 14.9*  NEUTROABS 12.2*  HGB 10.7*  HCT 35.3*  MCV 76.6*  PLT 488*   Cardiac Enzymes: No results for input(s): CKTOTAL, CKMB, CKMBINDEX, TROPONINI in the last 168 hours.  BNP (last 3 results) No results for input(s): PROBNP in the last 8760 hours. CBG: No results for input(s): GLUCAP in the last 168 hours.  Radiological Exams on Admission: Dg Chest Port 1 View  02/25/2014   CLINICAL DATA:  69 year old male with shortness of breath and decreased responsiveness  EXAM: PORTABLE CHEST - 1 VIEW  COMPARISON:  Prior CT scan of the chest 03/15/2013  FINDINGS: Cardiac and mediastinal contours are within normal limits. Inspiratory volumes are very low. Central bronchitic change in diffuse prominence of interstitial markings  are noted. There is a 1.5 cm spiculated nodular opacity in the right lung apex posterior to the clavicular head. The smaller nodular opacity is identified just above the diaphragm in the right lung base. No acute osseous abnormality.  IMPRESSION: 1. Low inspiratory volumes with central bronchitic change in diffuse interstitial prominence. Differential considerations include atypical infection and mild interstitial edema. 2. A 1.5 cm nodular opacity is noted in the right lung apex posterior to the clavicular head. Similar findings were seen on prior chest x-rays dating back to 2014 although not as conspicuous as on today's examination. This finding is favored to reflect an incidental confluence of shadows. Recommend repeat dedicated PA and lateral chest x-ray when the patient is able. If the nodular opacity remains present and appears more conspicuous than on prior imaging,  further evaluation with chest CT may become warranted. 3. Nonspecific nodular opacity in the right lung base could also be better evaluated on dedicated PA and lateral chest x-ray.   Electronically Signed   By: Jacqulynn Cadet M.D.   On: 02/25/2014 10:44    EKG: Baseline artifact. Narrow complex regular tachycardia at 155 bpm-possibly sinus tachycardia, normal axis and no acute changes.  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 Severe sepsis with acute organ dysfunction: Likely secondary to UTI. Admit to stepdown unit. Sepsis protocol initiated in ED. Continue aggressive IV fluid hydration, IV cefepime and vancomycin per pharmacy. Follow outstanding culture results. 2. UTI: IV cefepime pending culture results. 3. Severe dehydration: Possibly from poor oral intake and sepsis. IV fluids. 4. Acute Renal Failure: DD- prerenal, ATN, ACEI. Hold diuretics and ACEI. Hydrate with IV fluids and follow BMP daily. Check urine sodium and creatinine. 5. Acute respiratory failure with hypoxia: Although chest x-ray has been reported as pulmonary edema, patient is clinically dry without features of CHF. Agree with obtaining VQ scan to rule out PE given history of underlying cancer. Oxygen supplementation. No clinical bronchospasm. 6. Chronic anemia: Stable. 7. Hypotension: Secondary to sepsis and dehydration. Hold metoprolol and ACEI. IV fluids. 8. History of organic brain syndrome/deaf/mute: Continue scheduled Xanax that patient was on at [facility] and Neurontin. 9. Metastatic rectal cancer: We will alert primary oncologist of patient's admission. 10. Lung nodules in right apex and right base:  Seen on chest x-ray. As per recommendations, we'll get chest x-ray PA and lateral view in a.m. to further evaluate.     Code Status: Limited Code:  Dr. Noemi Chapel, EDP confirmed with patient's sister. She would like intubation if needed. Family Communication: Unable to reach sister  Disposition Plan: Return to SNF after medical stabilization in a couple of days.   Time spent: 43 minutes  HONGALGI,ANAND, MD, FACP, FHM. [facility] Pager [phone]  If 7PM-7AM, please contact night-coverage [email or web address] Password [email or web address] 02/25/2014, 1:04 PM

## 2014-02-25 NOTE — ED Notes (Signed)
Pt removed BP cuff and will not let this RN reapply it.

## 2014-02-25 NOTE — ED Notes (Signed)
Pt has urinal, he didn't urinate right away so I left it between his legs. Will check on him in 2 minutes.

## 2014-02-25 NOTE — ED Notes (Signed)
Patient transported to Pulmonary test.

## 2014-02-25 NOTE — ED Notes (Signed)
Code sepsis level ll called...klj

## 2014-02-25 NOTE — Progress Notes (Signed)
Utilization Review completed.  Annalysia Willenbring RN CM  

## 2014-02-25 NOTE — ED Notes (Signed)
Pt currently not in room for lab draw

## 2014-02-25 NOTE — ED Provider Notes (Signed)
CSN: [id number]     Arrival date & time 02/25/14  0940 History   First MD Initiated Contact with Patient 02/25/14 [phone]     Chief Complaint  Patient presents with  . Generalized Weakness   . Fall     (Consider location/radiation/quality/duration/timing/severity/associated sxs/prior Treatment) HPI   The patient is a 69 year old male, he has a history of metastatic rectal carcinoma to the liver, he has been undergoing palliative radiation therapy last in May according to the medical record. He is coming from [facility] facility. The report was that the patient had generalized weakness this morning, he is mute and deaf, he also has mental retardation. The patient is unable to give any valuable information. Paramedics noted that the patient was tachycardic, they were unable to obtain IV access.  Past Medical History  Diagnosis Date  . Mental retardation   . Organic brain syndrome   . Hypertension   . Deaf   . Clostridium difficile colitis   . Pneumonia   . Cellulitis   . Deaf   . Mutism   . Neuropathy   . Intra-abdominal abscess 12/19/2012  . Pneumoperitoneum of unknown etiology 12/19/2012  . Colon cancer 03/16/13    adenoma carcinomainvasive,mets to liver  . Anemia     iron deficiency  . Status post radiation therapy 04/17/13-04/23/13    pelvis 25Gy/5 fx   Past Surgical History  Procedure Laterality Date  . Orif right patella  09/23/2005  . Removal of right patella hardware  01/26/2006  . Colonoscopy N/A 03/16/2013    Procedure: COLONOSCOPY;  Surgeon: Beryle Beams, MD;  Location: [facility];  Service: Endoscopy;  Laterality: N/A;   History reviewed. No pertinent family history. History  Substance Use Topics  . Smoking status: Never Smoker   . Smokeless tobacco: Never Used  . Alcohol Use: No    Review of Systems  Unable to perform ROS: Other      Allergies  Review of patient's allergies indicates no known allergies.  Home Medications   Prior to  Admission medications   Medication Sig Start Date End Date Taking? Authorizing Provider  ALPRAZolam (XANAX) 0.25 MG tablet Take 0.25 mg by mouth 3 (three) times daily.   Yes Historical Provider, MD  benazepril (LOTENSIN) 5 MG tablet Take 5 mg by mouth daily.   Yes Historical Provider, MD  divalproex (DEPAKOTE) 125 MG DR tablet Take 125 mg by mouth at bedtime.   Yes Historical Provider, MD  ferrous sulfate 325 (65 FE) MG tablet Take 325 mg by mouth 2 (two) times daily with a meal.    Yes Historical Provider, MD  furosemide (LASIX) 20 MG tablet Take 20 mg by mouth daily.   Yes Historical Provider, MD  gabapentin (NEURONTIN) 300 MG capsule Take 1 capsule by mouth 3 (three) times daily. 11/16/12  Yes Historical Provider, MD  loratadine (CLARITIN) 10 MG tablet Take 10 mg by mouth daily.   Yes Historical Provider, MD  metoprolol tartrate (LOPRESSOR) 25 MG tablet Take 25 mg by mouth daily.   Yes Historical Provider, MD  Multiple Vitamin (MULTIVITAMIN WITH MINERALS) TABS tablet Take 1 tablet by mouth daily.   Yes Historical Provider, MD  PRESCRIPTION MEDICATION Take 240 mLs by mouth 3 (three) times daily. Med Plus   Yes Historical Provider, MD  saccharomyces boulardii (FLORASTOR) 250 MG capsule Take 250 mg by mouth every morning.   Yes Historical Provider, MD  traMADol (ULTRAM) 50 MG tablet Take 1 tablet (50 mg total)  by mouth every 6 (six) hours as needed. 03/20/13  Yes Kinnie Feil, MD  albuterol (PROVENTIL) (5 MG/ML) 0.5% nebulizer solution Take 0.63 mg by nebulization every 2 (two) hours as needed for wheezing or shortness of breath. 0.63mg /66ml 12/27/12   Theodis Blaze, MD  feeding supplement, ENSURE, (ENSURE) PUDG Take 1 Container by mouth daily. Patient not taking: Reported on 02/25/2014 12/27/12   Theodis Blaze, MD   BP 86/48 mmHg  Pulse 128  Temp(Src) 98.2 F (36.8 C) (Rectal)  Resp 22  Ht 6' (1.829 m)  Wt 180 lb (81.647 kg)  BMI 24.41 kg/m2  SpO2 98% Physical Exam  Constitutional: He  appears well-developed and well-nourished. He appears distressed.  HENT:  Head: Normocephalic and atraumatic.  Mouth/Throat: No oropharyngeal exudate.  Mucous membranes dehydrated  Eyes: Conjunctivae and EOM are normal. Pupils are equal, round, and reactive to light. Right eye exhibits no discharge. Left eye exhibits no discharge. No scleral icterus.  Neck: Normal range of motion. Neck supple. No JVD present. No thyromegaly present.  Cardiovascular: Regular rhythm, normal heart sounds and intact distal pulses.  Exam reveals no gallop and no friction rub.   No murmur heard. Tachycardic to 140  Pulmonary/Chest: Effort normal and breath sounds normal. No respiratory distress. He has no wheezes. He has no rales.  Mild tachypnea, no rales or wheezing  Abdominal: Soft. Bowel sounds are normal. He exhibits no distension and no mass. There is no tenderness.  Musculoskeletal: Normal range of motion. He exhibits no edema or tenderness.  Lymphadenopathy:    He has no cervical adenopathy.  Neurological: He is alert. Coordination normal.  Skin: Skin is warm and dry. No rash noted. No erythema.  Psychiatric: He has a normal mood and affect. His behavior is normal.  Nursing note and vitals reviewed.   ED Course  Procedures (including critical care time) Labs Review Labs Reviewed  CBC WITH DIFFERENTIAL/PLATELET - Abnormal; Notable for the following:    WBC 14.9 (*)    Hemoglobin 10.7 (*)    HCT 35.3 (*)    MCV 76.6 (*)    MCH 23.2 (*)    RDW 17.6 (*)    Platelets 488 (*)    Neutrophils Relative % 83 (*)    Neutro Abs 12.2 (*)    Lymphocytes Relative 9 (*)    Monocytes Absolute 1.1 (*)    All other components within normal limits  COMPREHENSIVE METABOLIC PANEL - Abnormal; Notable for the following:    BUN 53 (*)    Creatinine, Ser 1.74 (*)    Total Protein 8.4 (*)    Albumin 2.8 (*)    AST 69 (*)    Alkaline Phosphatase 453 (*)    GFR calc non Af Amer 39 (*)    GFR calc Af Amer 45 (*)     All other components within normal limits  I-STAT CG4 LACTIC ACID, ED - Abnormal; Notable for the following:    Lactic Acid, Venous 3.37 (*)    All other components within normal limits  CULTURE, BLOOD (ROUTINE X 2)  CULTURE, BLOOD (ROUTINE X 2)  URINE CULTURE  URINALYSIS, ROUTINE W REFLEX MICROSCOPIC    Imaging Review Dg Chest Port 1 View  02/25/2014   CLINICAL DATA:  69 year old male with shortness of breath and decreased responsiveness  EXAM: PORTABLE CHEST - 1 VIEW  COMPARISON:  Prior CT scan of the chest 03/15/2013  FINDINGS: Cardiac and mediastinal contours are within normal limits. Inspiratory volumes are  very low. Central bronchitic change in diffuse prominence of interstitial markings are noted. There is a 1.5 cm spiculated nodular opacity in the right lung apex posterior to the clavicular head. The smaller nodular opacity is identified just above the diaphragm in the right lung base. No acute osseous abnormality.  IMPRESSION: 1. Low inspiratory volumes with central bronchitic change in diffuse interstitial prominence. Differential considerations include atypical infection and mild interstitial edema. 2. A 1.5 cm nodular opacity is noted in the right lung apex posterior to the clavicular head. Similar findings were seen on prior chest x-rays dating back to 2014 although not as conspicuous as on today's examination. This finding is favored to reflect an incidental confluence of shadows. Recommend repeat dedicated PA and lateral chest x-ray when the patient is able. If the nodular opacity remains present and appears more conspicuous than on prior imaging, further evaluation with chest CT may become warranted. 3. Nonspecific nodular opacity in the right lung base could also be better evaluated on dedicated PA and lateral chest x-ray.   Electronically Signed   By: Jacqulynn Cadet M.D.   On: 02/25/2014 10:44      MDM   Final diagnoses:  Tachycardia  SOB (shortness of breath)  Shock     The patient has persistent hypotension, tachycardia, no fever. He has a leukocytosis, chest x-ray reveals no signs of pulmonary infiltrate however with his hypotension tachycardia and his lactic acidosis this is likely related to sepsis. We will obtain urinalysis to evaluate for urinary source, blood cultures drawn, 30 mL/kg fluid bolus has been ordered, broad-spectrum metabolic cover likely source which initially was pulmonary given his mild hypoxia and persistent symptoms. We'll also obtain a CT angiogram of the chest to evaluate for pulmonary embolism.  Care was discussed with the patient's sister A Ms. Gena Fray who states that the patient would not want to be placed on life support other than assistance with breathing. She declined central access and declines CPR or electrical shocks cardioversion defibrillation.  Would also consider PE - VQ ordered, has ARF, likely UTI - d/w hospitalist -   CRITICAL CARE Performed by: Johnna Acosta Total critical care time: 35 Critical care time was exclusive of separately billable procedures and treating other patients. Critical care was necessary to treat or prevent imminent or life-threatening deterioration. Critical care was time spent personally by me on the following activities: development of treatment plan with patient and/or surrogate as well as nursing, discussions with consultants, evaluation of patient's response to treatment, examination of patient, obtaining history from patient or surrogate, ordering and performing treatments and interventions, ordering and review of laboratory studies, ordering and review of radiographic studies, pulse oximetry and re-evaluation of patient's condition.   Johnna Acosta, MD 02/25/14 [phone]

## 2014-02-25 NOTE — ED Notes (Signed)
Upon assessment, Pt was found to have no IVs.  Charge RN informed this RN that the Pt removed them, during Pulmonary procedure.

## 2014-02-25 NOTE — ED Notes (Signed)
Per EMS, Pt, from [facility], c/o an unwitnessed fall and generalized weakness starting this morning.  Facility reported to EMS that the Pt fell while trying to get off the toilet this morning.  No injuries noted.  Facility reports Pt is at neuro baseline.  Pt is deaf and mute.  Hx of colon and rectal CA.

## 2014-02-25 NOTE — Progress Notes (Signed)
ANTIBIOTIC CONSULT NOTE - INITIAL  Pharmacy Consult for Vancomycin, Cefepime Indication: HCAP, Code Sepsis  No Known Allergies  Patient Measurements: Height: 6' (182.9 cm) Weight: 180 lb (81.647 kg) IBW/kg (Calculated) : 77.6   Vital Signs: Temp: 98.2 F (36.8 C) (02/10 1130) Temp Source: Rectal (02/10 1130) BP: 86/48 mmHg (02/10 1145) Pulse Rate: 128 (02/10 1145) Intake/Output from previous day:   Intake/Output from this shift:    Labs:  Recent Labs  02/25/14 1012  WBC 14.9*  HGB 10.7*  PLT 488*  CREATININE 1.74*   Estimated Creatinine Clearance: 45.2 mL/min (by C-G formula based on Cr of 1.74). No results for input(s): VANCOTROUGH, VANCOPEAK, VANCORANDOM, GENTTROUGH, GENTPEAK, GENTRANDOM, TOBRATROUGH, TOBRAPEAK, TOBRARND, AMIKACINPEAK, AMIKACINTROU, AMIKACIN in the last 72 hours.   Microbiology: No results found for this or any previous visit (from the past 720 hour(s)).  Medical History: Past Medical History  Diagnosis Date  . Mental retardation   . Organic brain syndrome   . Hypertension   . Deaf   . Clostridium difficile colitis   . Pneumonia   . Cellulitis   . Deaf   . Mutism   . Neuropathy   . Intra-abdominal abscess 12/19/2012  . Pneumoperitoneum of unknown etiology 12/19/2012  . Colon cancer 03/16/13    adenoma carcinomainvasive,mets to liver  . Anemia     iron deficiency  . Status post radiation therapy 04/17/13-04/23/13    pelvis 25Gy/5 fx    Assessment: 69 y/o M with PMH of colon cancer, mental retardation, deaf and mutism, C. diff colitis who presents with generalized weakness s/p fall this AM at [place] facility. Code sepsis called in ED, as patient hypotensive, tachycardic, with leukocytosis, and lactic acidosis. CXR shows low inspiratory volumes with central bronchitic changes in diffuse interstitial prominence as well as nonspecific nodular opacity in R lung base. Pharmacy consulted to assist with dosing of Vancomycin and  Cefepime for possible HCAP.  2/10 >> Vancomycin >> 2/10 >> Cefepime >>    Tmax: 98.2 F WBCs: elevated, 14.9 Renal: AKI, SCr 1.74, CrCl ~ 45 mL/min CG Lactic Acid: 3.37  2/10 blood x 2: sent 2/10 urine: sent  Goal of Therapy:  Vancomycin trough level 15-20 mcg/ml  Appropriate antibiotic dosing for renal function and indication Eradication of infection  Plan:   Vancomycin 1 g IV x 1 given in ED. Continue with Vancomycin 750 mg IV q12h to start at 1800 today.  Plan for Vancomycin trough level at steady state.  Cefepime 2 g IV x 1 given in ED. Continue with Cefepime 2 g IV q24h.  Monitor renal function, cultures, clinical course.  BMET in AM.   Lindell Spar, PharmD, BCPS Pager: [phone] 02/25/2014 12:01 PM

## 2014-02-25 NOTE — ED Notes (Signed)
Bed: VO72 Expected date:  Expected time:  Means of arrival:  Comments: EMS- elderly, CA Pt, generalized weakness

## 2014-02-26 ENCOUNTER — Inpatient Hospital Stay (HOSPITAL_COMMUNITY): Payer: Medicare Other

## 2014-02-26 DIAGNOSIS — N390 Urinary tract infection, site not specified: Secondary | ICD-10-CM

## 2014-02-26 LAB — COMPREHENSIVE METABOLIC PANEL
ALBUMIN: 2.4 g/dL — AB (ref 3.5–5.2)
ALT: 33 U/L (ref 0–53)
AST: 59 U/L — AB (ref 0–37)
Alkaline Phosphatase: 379 U/L — ABNORMAL HIGH (ref 39–117)
Anion gap: 10 (ref 5–15)
BUN: 43 mg/dL — ABNORMAL HIGH (ref 6–23)
CALCIUM: 7.6 mg/dL — AB (ref 8.4–10.5)
CO2: 21 mmol/L (ref 19–32)
Chloride: 115 mmol/L — ABNORMAL HIGH (ref 96–112)
Creatinine, Ser: 1.57 mg/dL — ABNORMAL HIGH (ref 0.50–1.35)
GFR calc non Af Amer: 44 mL/min — ABNORMAL LOW (ref >90)
GFR, EST AFRICAN AMERICAN: 51 mL/min — AB (ref >90)
GLUCOSE: 99 mg/dL (ref 70–99)
POTASSIUM: 3.8 mmol/L (ref 3.5–5.1)
Sodium: 146 mmol/L — ABNORMAL HIGH (ref 135–145)
TOTAL PROTEIN: 7 g/dL (ref 6.0–8.3)
Total Bilirubin: 0.9 mg/dL (ref 0.3–1.2)

## 2014-02-26 LAB — CBC
HCT: 32.5 % — ABNORMAL LOW (ref 39.0–52.0)
Hemoglobin: 9.9 g/dL — ABNORMAL LOW (ref 13.0–17.0)
MCH: 23.3 pg — ABNORMAL LOW (ref 26.0–34.0)
MCHC: 30.5 g/dL (ref 30.0–36.0)
MCV: 76.5 fL — AB (ref 78.0–100.0)
PLATELETS: 377 10*3/uL (ref 150–400)
RBC: 4.25 MIL/uL (ref 4.22–5.81)
RDW: 17.7 % — ABNORMAL HIGH (ref 11.5–15.5)
WBC: 14.1 10*3/uL — AB (ref 4.0–10.5)

## 2014-02-26 MED ORDER — ENSURE COMPLETE PO LIQD
237.0000 mL | Freq: Three times a day (TID) | ORAL | Status: DC
  Administered 2014-02-26 – 2014-03-04 (×14): 237 mL via ORAL

## 2014-02-26 MED ORDER — TECHNETIUM TO 99M ALBUMIN AGGREGATED
5.2000 | Freq: Once | INTRAVENOUS | Status: AC | PRN
  Administered 2014-02-26: 5 via INTRAVENOUS

## 2014-02-26 MED ORDER — SODIUM CHLORIDE 0.45 % IV SOLN
INTRAVENOUS | Status: DC
  Administered 2014-02-26: 10:00:00 via INTRAVENOUS

## 2014-02-26 NOTE — Evaluation (Addendum)
Physical Therapy Evaluation Patient Details Name: Victor Little MRN: [id number] DOB: 11-05-1945 Today's Date: 02/26/2014   History of Present Illness  69 yo male admitted from  [facility] fall. sepsis, hypotension,, HCAP, h/o con and rectal cancer with liver mets.  Clinical Impression  Patient able to follow  Gestures to sit up at the edge of the bed, unable to get OOB due to incontinence of BM. Patient will benefit from PT to address problems listed in note below. HR 105,up to 123 during mobility. sats 100% on RA    Follow Up Recommendations SNF;Supervision/Assistance - 24 hour    Equipment Recommendations  None recommended by PT    Recommendations for Other Services       Precautions / Restrictions Precautions Precautions: Fall Precaution Comments: incontinence      Mobility  Bed Mobility Overal bed mobility: Needs Assistance Bed Mobility: Supine to Sit;Sit to Supine     Supine to sit: Min assist Sit to supine: Min assist   General bed mobility comments: demonsration, tactile cues,  Transfers                 General transfer comment: T as had BM.  Ambulation/Gait                Stairs            Wheelchair Mobility    Modified Rankin (Stroke Patients Only)       Balance Overall balance assessment: History of Falls;Needs assistance Sitting-balance support: Bilateral upper extremity supported;Feet supported Sitting balance-Leahy Scale: Fair                                       Pertinent Vitals/Pain Pain Assessment: Faces Faces Pain Scale: No hurt    Home Living Family/patient expects to be discharged to:: Skilled nursing facility                      Prior Function           Comments: per RN, ambulatory until ill.     Hand Dominance        Extremity/Trunk Assessment   Upper Extremity Assessment: Generalized weakness           Lower Extremity Assessment: Generalized  weakness      Cervical / Trunk Assessment: Kyphotic  Communication      Cognition Arousal/Alertness: Awake/alert Behavior During Therapy: WFL for tasks assessed/performed Overall Cognitive Status: Difficult to assess                      General Comments      Exercises        Assessment/Plan    PT Assessment Patient needs continued PT services  PT Diagnosis Difficulty walking;Generalized weakness;Altered mental status   PT Problem List Decreased strength;Decreased activity tolerance;Decreased mobility;Decreased knowledge of precautions;Decreased safety awareness;Decreased knowledge of use of DME;Decreased cognition  PT Treatment Interventions Gait training;DME instruction;Functional mobility training;Therapeutic activities   PT Goals (Current goals can be found in the Care Plan section) Acute Rehab PT Goals PT Goal Formulation: Patient unable to participate in goal setting Time For Goal Achievement: 03/12/14 Potential to Achieve Goals: Good    Frequency Min 2X/week   Barriers to discharge        Co-evaluation               End of  Session   Activity Tolerance: Patient tolerated treatment well (limited by incontinence) Patient left: in bed;with call bell/phone within reach;with bed alarm set Nurse Communication: Mobility status (needs to be cleaned up)         Time: 1016-1030 PT Time Calculation (min) (ACUTE ONLY): 14 min   Charges:   PT Evaluation $Initial PT Evaluation Tier I: 1 Procedure     PT G CodesClaretha Cooper 02/26/2014, 10:57 AM Tresa Endo PT [phone]

## 2014-02-26 NOTE — Progress Notes (Signed)
INITIAL NUTRITION ASSESSMENT  DOCUMENTATION CODES Per approved criteria  -Non-severe (moderate) malnutrition in the context of chronic illness  Pt meets criteria for moderate MALNUTRITION in the context of chronic illness as evidenced by moderate fat and muscle wasting.  INTERVENTION: - Ensure Complete po TID, each supplement provides 350 kcal and 13 grams of protein - Magic cup TID with meals, each supplement provides 290 kcal and 9 grams of protein - RD will continue to monitor  NUTRITION DIAGNOSIS: Inadequate oral intake related to metastatic rectal cancer as evidenced by poor po and wt loss.   Goal: Pt to meet >/= 90% of their estimated nutrition needs   Monitor:  Weight trend, po intake, acceptance of supplements, labs  Reason for Assessment: Consult for nutrition assessment  69 y.o. male  Admitting Dx: Severe sepsis with acute organ dysfunction  ASSESSMENT: 69 y.o. male with history of metastatic rectal cancer, with enlarged perirectal lymph node and liver metastasis, s/p palliative XRT and was on observation by Oncology, rectal bleeding secondary to cancer, deaf, mute, mental retardation, HTN, C. difficile, anemia sent from [facility] SNF status post unwitnessed fall and generalized weakness.  - Unable to communicate with pt as pt deaf and non-verbal.  - Per RN, pt refused breakfast tray. RN tried to feed pt and he would not eat. Pt drinks lots of water. Able to use straw. Will order liquid nutritional supplements. Pt with 17% wt loss in the past 9 months.  - Labs reviewed- Na elevated, BUN elevated  Nutrition Focused Physical Exam:  Subcutaneous Fat:  Orbital Region: moderate wasting Upper Arm Region: moderate wasting Thoracic and Lumbar Region: n/a  Muscle:  Temple Region: moderate wasting Clavicle Bone Region: moderate to severe wasting Clavicle and Acromion Bone Region: moderate to severe wasting Scapular Bone Region: n/a Dorsal Hand: moderate  wasting Patellar Region: mild wasting Anterior Thigh Region: WNL Posterior Calf Region: mild wasting  Edema: none  Height: Ht Readings from Last 1 Encounters:  02/25/14 6' (1.829 m)    Weight: Wt Readings from Last 1 Encounters:  02/26/14 153 lb 14.1 oz (69.8 kg)    Ideal Body Weight: 77.6 kg  % Ideal Body Weight: 90%  Wt Readings from Last 10 Encounters:  02/26/14 153 lb 14.1 oz (69.8 kg)  06/12/13 184 lb 11.2 oz (83.779 kg)  04/15/13 174 lb 11.2 oz (79.243 kg)  04/07/13 171 lb 3.2 oz (77.656 kg)  12/27/12 169 lb 8.5 oz (76.9 kg)    BMI:  Body mass index is 20.87 kg/(m^2).  Estimated Nutritional Needs: Kcal: 1750-2000 Protein: 100-115 g Fluid: 1.8-2.0 L/day  Skin: intact  Diet Order: Diet regular  EDUCATION NEEDS: -No education needs identified at this time   Intake/Output Summary (Last 24 hours) at 02/26/14 1101 Last data filed at 02/26/14 0700  Gross per 24 hour  Intake   2210 ml  Output    250 ml  Net   1960 ml    Last BM: 2/11   Labs:   Recent Labs Lab 02/25/14 1012 02/26/14 0400  NA 143 146*  K 4.3 3.8  CL 103 115*  CO2 28 21  BUN 53* 43*  CREATININE 1.74* 1.57*  CALCIUM 8.6 7.6*  GLUCOSE 94 99    CBG (last 3)  No results for input(s): GLUCAP in the last 72 hours.  Scheduled Meds: . ALPRAZolam  0.25 mg Oral TID  . ceFEPime (MAXIPIME) IV  2 g Intravenous Q24H  . divalproex  125 mg Oral QHS  .  enoxaparin (LOVENOX) injection  40 mg Subcutaneous Q24H  . gabapentin  300 mg Oral TID  . loratadine  10 mg Oral Daily  . multivitamin with minerals  1 tablet Oral Daily  . saccharomyces boulardii  250 mg Oral q morning - 10a  . sodium chloride  3 mL Intravenous Q12H  . vancomycin  750 mg Intravenous Q12H    Continuous Infusions: . sodium chloride 125 mL/hr at 02/26/14 2395    Past Medical History  Diagnosis Date  . Mental retardation   . Organic brain syndrome   . Hypertension   . Deaf   . Clostridium difficile colitis   .  Pneumonia   . Cellulitis   . Deaf   . Mutism   . Neuropathy   . Intra-abdominal abscess 12/19/2012  . Pneumoperitoneum of unknown etiology 12/19/2012  . Colon cancer 03/16/13    adenoma carcinomainvasive,mets to liver  . Anemia     iron deficiency  . Status post radiation therapy 04/17/13-04/23/13    pelvis 25Gy/5 fx    Past Surgical History  Procedure Laterality Date  . Orif right patella  09/23/2005  . Removal of right patella hardware  01/26/2006  . Colonoscopy N/A 03/16/2013    Procedure: COLONOSCOPY;  Surgeon: Beryle Beams, MD;  Location: [facility];  Service: Endoscopy;  Laterality: N/A;    Laurette Schimke MS, RD, LDN

## 2014-02-26 NOTE — Progress Notes (Signed)
Clinical Social Work Department BRIEF PSYCHOSOCIAL ASSESSMENT 02/26/2014  Patient:  Victor Little, Victor Little     Account Number:  [id number]     Admit date:  02/25/2014  Clinical Social Worker:  Maryln Manuel  Date/Time:  02/26/2014 12:00 N  Referred by:  Physician  Date Referred:  02/26/2014 Referred for  SNF Placement   Other Referral:   Interview type:  Family Other interview type:   pt is deaf and mute; pt sister, Victor Headings is pt guardian    PSYCHOSOCIAL DATA Living Status:  FACILITY Admitted from facility:  [facility] Level of care:  [place] Primary support name:  Victor Little/[phone] Primary support relationship to patient:  [place] Degree of support available:   Adequate    CURRENT CONCERNS Current Concerns  Post-Acute Placement   Other Concerns:    SOCIAL WORK ASSESSMENT / PLAN CSW received referral due to patient being admitted from a SNF. CSW reviewed chart which stated that patient is from [facility]. CSW attempted to meet with patient at bedside but patient is mute and deaf and unable to participate in assessment.     CSW contacted [facility] and spoke with Santiago Glad in admissions who stated that pt sister, Victor Headings is pt guardian. CSW contacted pt sister, Victor Headings via telephone. CSW introduced self and explained role. Pt sister stated that she was noified by ED MD that pt being admitted to the hospital yesterday. Pt sister confirmed that pt is a resident at [facility] Pt sister stated that plan will be for pt to return to [facility] when medically stable. CSW attempted to explore with pt sister how long pt has been there and how things have been going at facility, but pt sister very short and stated that pt has been at facility for a while.    CSW went back to pt room as [facility] stated that pt lip reads and notified pt that pt sister aware of admission. Pt smiled and  attemped to talk, but pt speech incomprehensible.    CSW completed FL2 and sent clinical information to [facility].    CSW to continue to follow to provide suppor and assist with pt discharge needs back to [facility] when pt medically stable.   Assessment/plan status:  Psychosocial Support/Ongoing Assessment of Needs Other assessment/ plan:   discharge planning   Information/referral to community resources:   Referral back to [facility].    PATIENT'S/FAMILY'S RESPONSE TO PLAN OF CARE: Pt alert and cooperative, but mute and deaf and unable to participate in assessment. Pt sister states that plan is for pt to return to [facility]. Pt sister states pt has been at facility "a while", but did not share her feelings about how she feels pt is being cared for at the facility.   Alison Murray, MSW, [place] [facility] [phone]

## 2014-02-26 NOTE — Progress Notes (Signed)
PROGRESS NOTE    Victor Little XNT:[id number] DOB: [birth year] DOA: 02/25/2014 PCP: [name], MD  HPI/Brief narrative 69 y.o. male with history of metastatic rectal cancer, with enlarged perirectal lymph node and liver metastasis, s/p palliative XRT and was on observation by Oncology, rectal bleeding secondary to cancer, deaf, mute, mental retardation, HTN, C. difficile, anemia sent from [facility] status post unwitnessed fall and generalized weakness. In the ED, noted to be hypotensive, tachycardic and hypoxic. Chest x-ray not suggestive of pneumonia. In and out urinary catheterization revealed cloudy urine with sediments. Patient was admitted to step down unit for severe sepsis due to UTI, dehydration, acute renal failure and hypoxia.   Assessment/Plan:    1. Severe sepsis with acute organ dysfunction: Likely secondary to UTI. Admitted to stepdown unit. Sepsis protocol initiated in ED. Treated with aggressive IV fluid hydration, IV cefepime and vancomycin per pharmacy. Blood culture 2 & urine culture: Pending. Sepsis physiology improved.  2. UTI: IV cefepime pending culture results. 3. Severe dehydration: Possibly from poor oral intake and sepsis. Improved but still volume depleted as evidenced by dry oral mucosa, decreased urine output and soft blood pressures. Continue IV fluids for additional 24 hours. 4. Acute Renal Failure: DD- prerenal, ATN, ACEI. Hold diuretics and ACEI. Hydrate with IV fluids and follow BMP daily. Check urine sodium and creatinine. Reduced urine output. Creatinine improving. 5. Acute respiratory failure with hypoxia: Although chest x-ray has been reported as pulmonary edema, patient is clinically dry without features of CHF. VQ scan: Pending. Oxygen supplementation. No clinical bronchospasm. 6. Chronic anemia: Stable. 7. Hypotension: Secondary to sepsis and dehydration. Hold metoprolol and ACEI. IV fluids. Soft blood pressures with SBP in the 90s.  Continue IV fluids. 8. History of organic brain syndrome/deaf/mute: Continue scheduled Xanax that patient was on at [facility] and Neurontin. 9. Metastatic rectal cancer: As per primary oncologist-patient lost to outpatient follow-up. Consider outpatient follow-up through SNF.. 10. Lung nodules in right apex and right base: Seen on chest x-ray. As per recommendations, we'll get chest x-ray PA and lateral view in a.m. to further evaluate. Chest x-ray pending.    Code Status: Partial code: Intubation desired Family Communication: Unable to reach family yesterday. We'll try again. Disposition Plan: Return to SNF when medically stable. Continue management in stepdown unit for additional 24 hours.   Consultants:  None  Procedures:  None  Antibiotics:  IV vancomycin 2/10 >  IV cefepime 2/10 >   Subjective: No history from patient: Deaf and mute. As per nursing, no acute events overnight except for decreased urine output. Patient quite thirsty and drinking liberally.  Objective: Filed Vitals:   02/26/14 0359 02/26/14 0400 02/26/14 0500 02/26/14 0600  BP:  105/72 101/66 82/53  Pulse:  104 117 107  Temp: 97.7 F (36.5 C)     TempSrc: Oral     Resp:  28 22 0  Height:      Weight: 69.8 kg (153 lb 14.1 oz)     SpO2:  100% 99% 100%    Intake/Output Summary (Last 24 hours) at 02/26/14 0806 Last data filed at 02/26/14 0700  Gross per 24 hour  Intake   2210 ml  Output    250 ml  Net   1960 ml   Filed Weights   02/25/14 1024 02/25/14 1747 02/26/14 0359  Weight: 81.647 kg (180 lb) 68.5 kg (151 lb 0.2 oz) 69.8 kg (153 lb 14.1 oz)     Exam:  General exam: Pleasant  middle-aged male lying comfortably supine in bed. Poor oral hygiene with stained multiple teeth. Oral mucosa dry. Respiratory system: Clear. No increased work of breathing. Cardiovascular system: S1 & S2 heard, RRR. No JVD, murmurs, gallops, clicks or pedal edema. Telemetry: Sinus tachycardia in the 100s (better than on  admission) Gastrointestinal system: Abdomen is nondistended, soft and nontender. Normal bowel sounds heard. Condom cath + Central nervous system: Alert and follow some instructions. No focal neurological deficits. Extremities: Symmetric 5 x 5 power.   Data Reviewed: Basic Metabolic Panel:  Recent Labs Lab 02/25/14 1012 02/26/14 0400  NA 143 146*  K 4.3 3.8  CL 103 115*  CO2 28 21  GLUCOSE 94 99  BUN 53* 43*  CREATININE 1.74* 1.57*  CALCIUM 8.6 7.6*   Liver Function Tests:  Recent Labs Lab 02/25/14 1012 02/26/14 0400  AST 69* 59*  ALT 39 33  ALKPHOS 453* 379*  BILITOT 0.9 0.9  PROT 8.4* 7.0  ALBUMIN 2.8* 2.4*   No results for input(s): LIPASE, AMYLASE in the last 168 hours. No results for input(s): AMMONIA in the last 168 hours. CBC:  Recent Labs Lab 02/25/14 1012 02/26/14 0400  WBC 14.9* 14.1*  NEUTROABS 12.2*  --   HGB 10.7* 9.9*  HCT 35.3* 32.5*  MCV 76.6* 76.5*  PLT 488* 377   Cardiac Enzymes: No results for input(s): CKTOTAL, CKMB, CKMBINDEX, TROPONINI in the last 168 hours. BNP (last 3 results) No results for input(s): PROBNP in the last 8760 hours. CBG: No results for input(s): GLUCAP in the last 168 hours.  Recent Results (from the past 240 hour(s))  MRSA PCR Screening     Status: None   Collection Time: 02/25/14  6:21 PM  Result Value Ref Range Status   MRSA by PCR NEGATIVE NEGATIVE Final    Comment:        The GeneXpert MRSA Assay (FDA approved for NASAL specimens only), is one component of a comprehensive MRSA colonization surveillance program. It is not intended to diagnose MRSA infection nor to guide or monitor treatment for MRSA infections.           Studies: Dg Chest Port 1 View  02/25/2014   CLINICAL DATA:  69 year old male with shortness of breath and decreased responsiveness  EXAM: PORTABLE CHEST - 1 VIEW  COMPARISON:  Prior CT scan of the chest 03/15/2013  FINDINGS: Cardiac and mediastinal contours are within normal  limits. Inspiratory volumes are very low. Central bronchitic change in diffuse prominence of interstitial markings are noted. There is a 1.5 cm spiculated nodular opacity in the right lung apex posterior to the clavicular head. The smaller nodular opacity is identified just above the diaphragm in the right lung base. No acute osseous abnormality.  IMPRESSION: 1. Low inspiratory volumes with central bronchitic change in diffuse interstitial prominence. Differential considerations include atypical infection and mild interstitial edema. 2. A 1.5 cm nodular opacity is noted in the right lung apex posterior to the clavicular head. Similar findings were seen on prior chest x-rays dating back to 2014 although not as conspicuous as on today's examination. This finding is favored to reflect an incidental confluence of shadows. Recommend repeat dedicated PA and lateral chest x-ray when the patient is able. If the nodular opacity remains present and appears more conspicuous than on prior imaging, further evaluation with chest CT may become warranted. 3. Nonspecific nodular opacity in the right lung base could also be better evaluated on dedicated PA and lateral chest x-ray.   Electronically  Signed   By: Jacqulynn Cadet M.D.   On: 02/25/2014 10:44        Scheduled Meds: . ALPRAZolam  0.25 mg Oral TID  . ceFEPime (MAXIPIME) IV  2 g Intravenous Q24H  . divalproex  125 mg Oral QHS  . enoxaparin (LOVENOX) injection  40 mg Subcutaneous Q24H  . gabapentin  300 mg Oral TID  . loratadine  10 mg Oral Daily  . multivitamin with minerals  1 tablet Oral Daily  . saccharomyces boulardii  250 mg Oral q morning - 10a  . sodium chloride  3 mL Intravenous Q12H  . vancomycin  750 mg Intravenous Q12H   Continuous Infusions: . 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 [facility] Pager [phone]  If 7PM-7AM, please contact night-coverage [email or web address] Password [email or web address] 02/26/2014, 8:06 AM    LOS: 1 day

## 2014-02-27 LAB — COMPREHENSIVE METABOLIC PANEL
ALT: 38 U/L (ref 0–53)
ANION GAP: 9 (ref 5–15)
AST: 75 U/L — AB (ref 0–37)
Albumin: 2.1 g/dL — ABNORMAL LOW (ref 3.5–5.2)
Alkaline Phosphatase: 406 U/L — ABNORMAL HIGH (ref 39–117)
BUN: 35 mg/dL — ABNORMAL HIGH (ref 6–23)
CALCIUM: 7.3 mg/dL — AB (ref 8.4–10.5)
CO2: 21 mmol/L (ref 19–32)
CREATININE: 1.36 mg/dL — AB (ref 0.50–1.35)
Chloride: 112 mmol/L (ref 96–112)
GFR calc Af Amer: 61 mL/min — ABNORMAL LOW (ref >90)
GFR, EST NON AFRICAN AMERICAN: 52 mL/min — AB (ref >90)
Glucose, Bld: 90 mg/dL (ref 70–99)
Potassium: 3.7 mmol/L (ref 3.5–5.1)
Sodium: 142 mmol/L (ref 135–145)
TOTAL PROTEIN: 6.1 g/dL (ref 6.0–8.3)
Total Bilirubin: 0.8 mg/dL (ref 0.3–1.2)

## 2014-02-27 LAB — CBC
HCT: 29.8 % — ABNORMAL LOW (ref 39.0–52.0)
Hemoglobin: 9 g/dL — ABNORMAL LOW (ref 13.0–17.0)
MCH: 23.1 pg — ABNORMAL LOW (ref 26.0–34.0)
MCHC: 30.2 g/dL (ref 30.0–36.0)
MCV: 76.6 fL — ABNORMAL LOW (ref 78.0–100.0)
PLATELETS: 342 10*3/uL (ref 150–400)
RBC: 3.89 MIL/uL — AB (ref 4.22–5.81)
RDW: 17.8 % — ABNORMAL HIGH (ref 11.5–15.5)
WBC: 13.4 10*3/uL — ABNORMAL HIGH (ref 4.0–10.5)

## 2014-02-27 LAB — VANCOMYCIN, TROUGH: VANCOMYCIN TR: 30.8 ug/mL — AB (ref 10.0–20.0)

## 2014-02-27 MED ORDER — SODIUM CHLORIDE 0.45 % IV SOLN
INTRAVENOUS | Status: DC
Start: 2014-02-27 — End: 2014-02-28
  Administered 2014-02-27: 14:00:00 via INTRAVENOUS

## 2014-02-27 NOTE — Clinical Documentation Improvement (Signed)
Possible Clinical Conditions? Severe Malnutrition  Protein Calorie Malnutrition Severe Protein Calorie Malnutrition Other Condition Cannot clinically determine Supporting Information: INITIAL NUTRITION ASSESSMENT by Dorann Ou, RD on 03-14-14   DOCUMENTATION CODES  Per approved criteria   -Non-severe (moderate) malnutrition in the context of chronic illness   Pt meets criteria for moderate MALNUTRITION in the context of chronic illness as evidenced by moderate fat and muscle wasting.  INTERVENTION:  - Ensure Complete po TID, each supplement provides 350 kcal and 13 grams of protein  - Magic cup TID with meals, each supplement provides 290 kcal and 9 grams of protein  - RD will continue to monitor  NUTRITION DIAGNOSIS:  Inadequate oral intake related to metastatic rectal cancer as evidenced by poor po and wt loss.    Thank You, Alessandra Grout, RN, BSN, CCDS,Clinical Documentation Specialist:  [phone]  [phone]=[facility] [place]- Health Information Management

## 2014-02-27 NOTE — Progress Notes (Signed)
PROGRESS NOTE    Victor Little TSV:[id number] DOB: [birth year] DOA: 02/25/2014 PCP: [name], MD  HPI/Brief narrative 69 y.o. male with history of metastatic rectal cancer, with enlarged perirectal lymph node and liver metastasis, s/p palliative XRT and was on observation by Oncology, rectal bleeding secondary to cancer, deaf, mute, mental retardation, HTN, C. difficile, anemia sent from [facility] status post unwitnessed fall and generalized weakness. In the ED, noted to be hypotensive, tachycardic and hypoxic. Chest x-ray not suggestive of pneumonia. In and out urinary catheterization revealed cloudy urine with sediments. Patient was admitted to step down unit for severe sepsis due to UTI, dehydration, acute renal failure and hypoxia.   Assessment/Plan:    1. Severe sepsis with acute organ dysfunction: Likely secondary to UTI. Admitted to stepdown unit. Sepsis protocol initiated in ED. Treated with aggressive IV fluid hydration, IV cefepime and vancomycin per pharmacy. Blood culture 2: Negative to date & urine culture: Pending. Sepsis physiology improved. Will DC vancomycin. 2. UTI: IV cefepime pending culture results. 3. Severe dehydration: Possibly from poor oral intake and sepsis. Improved. BP and urine output improved. Good oral intake. Continue reduced IV fluids for additional 24 hours. 4. Acute Renal Failure: DD- prerenal, ATN, ACEI. Hold diuretics and ACEI. Hydrate with IV fluids and follow BMP daily. Urine output improved. Creatinine improving. 5. Acute respiratory failure with hypoxia: Although chest x-ray has been reported as pulmonary edema, patient was clinically dry without features of CHF. VQ scan: Negative. Hypoxia resolved. 6. Chronic anemia: Stable. Some of the hemoglobin drop was probably dilutional. 7. Hypotension: Secondary to sepsis and dehydration. Hold metoprolol and ACEI. IV fluids. Blood pressures improved. Continue IV fluids. 8. History of  organic brain syndrome/deaf/mute: Continue scheduled Xanax that patient was on at [facility] and Neurontin. 9. Metastatic rectal cancer: As per primary oncologist-patient lost to outpatient follow-up. Consider outpatient follow-up through SNF.. 10. Lung nodules in right apex and right base: Seen on initial chest x-ray. Repeat chest x-ray suggests multiple pulmonary nodules-consider outpatient CT scan for further evaluation. 11. Mild sinus tachycardia: Heart rate in the 110s-120s. Check EKG and TSH. Asymptomatic and stable.  Code Status: Partial code: Intubation desired Family Communication: Unable to reach family yesterday. We'll try again. Disposition Plan: Transfer to medical bed. Possible discharge to SNF in 1-2 days.   Consultants:  None  Procedures:  None  Antibiotics:  IV vancomycin 2/10 >  IV cefepime 2/10 >   Subjective: No history from patient: Deaf and mute. As per nursing, no acute events overnight. Eating and drinking well. Good urine output. Had 2 BMs today-soft and green.  Objective: Filed Vitals:   02/26/14 2300 02/27/14 0000 02/27/14 0300 02/27/14 0400  BP:  106/70  116/83  Pulse:  120  111  Temp: 97.4 F (36.3 C)  97.8 F (36.6 C)   TempSrc: Oral  Axillary   Resp:  17  22  Height:      Weight:      SpO2:  98%  100%    Intake/Output Summary (Last 24 hours) at 02/27/14 0747 Last data filed at 02/27/14 0700  Gross per 24 hour  Intake   1775 ml  Output    555 ml  Net   1220 ml   Filed Weights   02/25/14 1024 02/25/14 1747 02/26/14 0359  Weight: 81.647 kg (180 lb) 68.5 kg (151 lb 0.2 oz) 69.8 kg (153 lb 14.1 oz)     Exam:  General exam: Pleasant middle-aged male lying  comfortably supine in bed. Poor oral hygiene with stained multiple teeth. Oral mucosa moist. Respiratory system: Clear. No increased work of breathing. Cardiovascular system: S1 & S2 heard, RRR. No JVD, murmurs, gallops, clicks or pedal edema. Telemetry: Sinus tachycardia in the  100s-110's Gastrointestinal system: Abdomen is nondistended, soft and nontender. Normal bowel sounds heard. Condom cath + Central nervous system: Alert and follow some instructions. No focal neurological deficits. Extremities: Symmetric 5 x 5 power.   Data Reviewed: Basic Metabolic Panel:  Recent Labs Lab 02/25/14 1012 02/26/14 0400 02/27/14 0359  NA 143 146* 142  K 4.3 3.8 3.7  CL 103 115* 112  CO2 28 21 21   GLUCOSE 94 99 90  BUN 53* 43* 35*  CREATININE 1.74* 1.57* 1.36*  CALCIUM 8.6 7.6* 7.3*   Liver Function Tests:  Recent Labs Lab 02/25/14 1012 02/26/14 0400 02/27/14 0359  AST 69* 59* 75*  ALT 39 33 38  ALKPHOS 453* 379* 406*  BILITOT 0.9 0.9 0.8  PROT 8.4* 7.0 6.1  ALBUMIN 2.8* 2.4* 2.1*   No results for input(s): LIPASE, AMYLASE in the last 168 hours. No results for input(s): AMMONIA in the last 168 hours. CBC:  Recent Labs Lab 02/25/14 1012 02/26/14 0400 02/27/14 0359  WBC 14.9* 14.1* 13.4*  NEUTROABS 12.2*  --   --   HGB 10.7* 9.9* 9.0*  HCT 35.3* 32.5* 29.8*  MCV 76.6* 76.5* 76.6*  PLT 488* 377 342   Cardiac Enzymes: No results for input(s): CKTOTAL, CKMB, CKMBINDEX, TROPONINI in the last 168 hours. BNP (last 3 results) No results for input(s): PROBNP in the last 8760 hours. CBG: No results for input(s): GLUCAP in the last 168 hours.  Recent Results (from the past 240 hour(s))  Blood Culture (routine x 2)     Status: None (Preliminary result)   Collection Time: 02/25/14 10:11 AM  Result Value Ref Range Status   Specimen Description BLOOD RIGHT WRIST  Final   Special Requests BOTTLES DRAWN AEROBIC AND ANAEROBIC 5 CC EA  Final   Culture   Final           BLOOD CULTURE RECEIVED NO GROWTH TO DATE CULTURE WILL BE HELD FOR 5 DAYS BEFORE ISSUING A FINAL NEGATIVE REPORT Performed at [facility]    Report Status PENDING  Incomplete  Blood Culture (routine x 2)     Status: None (Preliminary result)   Collection Time: 02/25/14 10:13 AM   Result Value Ref Range Status   Specimen Description BLOOD LEFT HAND  Final   Special Requests BOTTLES DRAWN AEROBIC AND ANAEROBIC 5 CC EA  Final   Culture   Final           BLOOD CULTURE RECEIVED NO GROWTH TO DATE CULTURE WILL BE HELD FOR 5 DAYS BEFORE ISSUING A FINAL NEGATIVE REPORT Performed at [facility]    Report Status PENDING  Incomplete  Urine culture     Status: None (Preliminary result)   Collection Time: 02/25/14 11:30 AM  Result Value Ref Range Status   Specimen Description URINE, CATHETERIZED  Final   Special Requests NONE  Final   Colony Count PENDING  Incomplete   Culture   Final    Culture reincubated for better growth Performed at [facility]    Report Status PENDING  Incomplete  MRSA PCR Screening     Status: None   Collection Time: 02/25/14  6:21 PM  Result Value Ref Range Status   MRSA by PCR NEGATIVE NEGATIVE  Final    Comment:        The GeneXpert MRSA Assay (FDA approved for NASAL specimens only), is one component of a comprehensive MRSA colonization surveillance program. It is not intended to diagnose MRSA infection nor to guide or monitor treatment for MRSA infections.           Studies: Dg Chest 2 View  02/26/2014   CLINICAL DATA:  Subsequent encounter for followup of pulmonary nodule.  EXAM: CHEST  2 VIEW  COMPARISON:  02/25/2014 and CT of 03/15/2013.  FINDINGS: Numerous leads and wires project over the chest. Patient rotated right. Normal heart size. Left hemidiaphragm elevation is mild. No pleural effusion or pneumothorax. The nodular density projecting over the right lung base is not readily apparent. Right upper lobe nodular density is not readily apparent. This area is suboptimally evaluated secondary to obliquity. There is nodularity within both lungs. Potential left apical nodule measures 1.1 cm. Smaller bilateral mid lung nodules. Left-sided nodule may be calcified and represent a granuloma.  No lobar consolidation.   IMPRESSION: Scattered pulmonary nodules suspected. The right upper lobe/ apical nodule described on the prior exam is not readily apparent and may have represented summation of osseous shadows. Regardless, given the history of colon cancer and small pulmonary nodules on 03/15/2013 CT (which is motion degraded), followup with CT should be considered.   Electronically Signed   By: Abigail Miyamoto M.D.   On: 02/26/2014 15:21   Nm Pulmonary Perfusion  02/26/2014   CLINICAL DATA:  Short of breath. Concern pulmonary embolism. History of colorectal malignancy.  EXAM: NUCLEAR MEDICINE  PERFUSION LUNG SCAN  TECHNIQUE: Patient refused ventilation portion exam. Perfusion images were obtained in multiple projections after intravenous injection of Tc-31m MAA.  RADIOPHARMACEUTICALS:  5.2 mCi Tc-39m MAA.  COMPARISON:  Radiograph 02/26/2014  FINDINGS: No wedge-shaped peripheral perfusion defects within the left or right lung to suggest acute pulmonary embolism.  IMPRESSION: No evidence of acute pulmonary embolism.   Electronically Signed   By: Suzy Bouchard M.D.   On: 02/26/2014 14:35   Dg Chest Port 1 View  02/25/2014   CLINICAL DATA:  69 year old male with shortness of breath and decreased responsiveness  EXAM: PORTABLE CHEST - 1 VIEW  COMPARISON:  Prior CT scan of the chest 03/15/2013  FINDINGS: Cardiac and mediastinal contours are within normal limits. Inspiratory volumes are very low. Central bronchitic change in diffuse prominence of interstitial markings are noted. There is a 1.5 cm spiculated nodular opacity in the right lung apex posterior to the clavicular head. The smaller nodular opacity is identified just above the diaphragm in the right lung base. No acute osseous abnormality.  IMPRESSION: 1. Low inspiratory volumes with central bronchitic change in diffuse interstitial prominence. Differential considerations include atypical infection and mild interstitial edema. 2. A 1.5 cm nodular opacity is noted in the  right lung apex posterior to the clavicular head. Similar findings were seen on prior chest x-rays dating back to 2014 although not as conspicuous as on today's examination. This finding is favored to reflect an incidental confluence of shadows. Recommend repeat dedicated PA and lateral chest x-ray when the patient is able. If the nodular opacity remains present and appears more conspicuous than on prior imaging, further evaluation with chest CT may become warranted. 3. Nonspecific nodular opacity in the right lung base could also be better evaluated on dedicated PA and lateral chest x-ray.   Electronically Signed   By: Jacqulynn Cadet M.D.   On: 02/25/2014  10:44        Scheduled Meds: . ALPRAZolam  0.25 mg Oral TID  . ceFEPime (MAXIPIME) IV  2 g Intravenous Q24H  . divalproex  125 mg Oral QHS  . enoxaparin (LOVENOX) injection  40 mg Subcutaneous Q24H  . feeding supplement (ENSURE COMPLETE)  237 mL Oral TID BM  . gabapentin  300 mg Oral TID  . loratadine  10 mg Oral Daily  . multivitamin with minerals  1 tablet Oral Daily  . saccharomyces boulardii  250 mg Oral q morning - 10a  . sodium chloride  3 mL Intravenous Q12H   Continuous Infusions: . 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 [facility] Pager [phone]  If 7PM-7AM, please contact night-coverage [email or web address] Password [email or web address] 02/27/2014, 7:47 AM    LOS: 2 days

## 2014-02-28 DIAGNOSIS — R911 Solitary pulmonary nodule: Secondary | ICD-10-CM | POA: Insufficient documentation

## 2014-02-28 DIAGNOSIS — L8990 Pressure ulcer of unspecified site, unspecified stage: Secondary | ICD-10-CM | POA: Insufficient documentation

## 2014-02-28 DIAGNOSIS — Z87898 Personal history of other specified conditions: Secondary | ICD-10-CM | POA: Insufficient documentation

## 2014-02-28 DIAGNOSIS — F79 Unspecified intellectual disabilities: Secondary | ICD-10-CM

## 2014-02-28 DIAGNOSIS — H913 Deaf nonspeaking, not elsewhere classified: Secondary | ICD-10-CM

## 2014-02-28 DIAGNOSIS — R000 Tachycardia, unspecified: Secondary | ICD-10-CM | POA: Insufficient documentation

## 2014-02-28 DIAGNOSIS — R531 Weakness: Secondary | ICD-10-CM

## 2014-02-28 DIAGNOSIS — R579 Shock, unspecified: Secondary | ICD-10-CM | POA: Insufficient documentation

## 2014-02-28 DIAGNOSIS — D509 Iron deficiency anemia, unspecified: Secondary | ICD-10-CM

## 2014-02-28 DIAGNOSIS — Z8619 Personal history of other infectious and parasitic diseases: Secondary | ICD-10-CM | POA: Insufficient documentation

## 2014-02-28 LAB — COMPREHENSIVE METABOLIC PANEL
ALT: 56 U/L — ABNORMAL HIGH (ref 0–53)
ANION GAP: 7 (ref 5–15)
AST: 112 U/L — ABNORMAL HIGH (ref 0–37)
Albumin: 1.8 g/dL — ABNORMAL LOW (ref 3.5–5.2)
Alkaline Phosphatase: 443 U/L — ABNORMAL HIGH (ref 39–117)
BILIRUBIN TOTAL: 0.7 mg/dL (ref 0.3–1.2)
BUN: 32 mg/dL — AB (ref 6–23)
CHLORIDE: 110 mmol/L (ref 96–112)
CO2: 22 mmol/L (ref 19–32)
CREATININE: 1.29 mg/dL (ref 0.50–1.35)
Calcium: 7.4 mg/dL — ABNORMAL LOW (ref 8.4–10.5)
GFR calc Af Amer: 65 mL/min — ABNORMAL LOW (ref >90)
GFR calc non Af Amer: 56 mL/min — ABNORMAL LOW (ref >90)
Glucose, Bld: 100 mg/dL — ABNORMAL HIGH (ref 70–99)
Potassium: 4.3 mmol/L (ref 3.5–5.1)
SODIUM: 139 mmol/L (ref 135–145)
TOTAL PROTEIN: 6 g/dL (ref 6.0–8.3)

## 2014-02-28 LAB — CBC
HCT: 26.2 % — ABNORMAL LOW (ref 39.0–52.0)
HEMOGLOBIN: 8.2 g/dL — AB (ref 13.0–17.0)
MCH: 23.7 pg — ABNORMAL LOW (ref 26.0–34.0)
MCHC: 31.3 g/dL (ref 30.0–36.0)
MCV: 75.7 fL — AB (ref 78.0–100.0)
Platelets: 316 10*3/uL (ref 150–400)
RBC: 3.46 MIL/uL — ABNORMAL LOW (ref 4.22–5.81)
RDW: 17.9 % — AB (ref 11.5–15.5)
WBC: 12.2 10*3/uL — ABNORMAL HIGH (ref 4.0–10.5)

## 2014-02-28 MED ORDER — DEXTROSE 5 % IV SOLN
2.0000 g | Freq: Two times a day (BID) | INTRAVENOUS | Status: DC
  Administered 2014-02-28 – 2014-03-02 (×5): 2 g via INTRAVENOUS
  Filled 2014-02-28 (×5): qty 2

## 2014-02-28 NOTE — Progress Notes (Signed)
Addendum:  Spiked fever again. BCx from admission NTD. UCx: EColi- sensitivity pending. Repeat BCx. Requested ID consult. If he continues to spike fevers, he may need FUO w/u including imaging chest/abdomen and pelvis.  Vernell Leep, MD, FACP, FHM. [facility] Pager [phone]  If 7PM-7AM, please contact night-coverage [email or web address] Password [facility] 02/28/2014, 3:04 PM

## 2014-02-28 NOTE — Consult Note (Signed)
[facility]    Date of Admission:  02/25/2014  Date of Consult:  02/28/2014  Reason for Consult: FUO Referring Physician: Dr. Algis Liming   HPI: Victor Little is an 69 y.o. male.History of metastatic rectal cancer, with enlarged perirectal lymph node and liver metastasis, s/p palliative XRT and was on observation by Oncology, rectal bleeding secondary to cancer, deaf, mute, mental retardation,sent from [facility] status post unwitnessed fall and generalized weakness.  CXR was negative. UA showed 21-50 wbc, many bacteria,, culture. He was started on cefepime and vancomycin narrowed to vancomycin. Urine culture growiing E. Coli 100K. He has improved clinically and been able to communicate. HOwver now he has had fever to 103.  He has past history of intrabdominal abscess, pneumoperitoneum of unknown origin , Clostridium difficile colitis.    Past Medical History  Diagnosis Date  . Mental retardation   . Organic brain syndrome   . Hypertension   . Deaf   . Clostridium difficile colitis   . Pneumonia   . Cellulitis   . Deaf   . Mutism   . Neuropathy   . Intra-abdominal abscess 12/19/2012  . Pneumoperitoneum of unknown etiology 12/19/2012  . Colon cancer 03/16/13    adenoma carcinomainvasive,mets to liver  . Anemia     iron deficiency  . Status post radiation therapy 04/17/13-04/23/13    pelvis 25Gy/5 fx    Past Surgical History  Procedure Laterality Date  . Orif right patella  09/23/2005  . Removal of right patella hardware  01/26/2006  . Colonoscopy N/A 03/16/2013    Procedure: COLONOSCOPY;  Surgeon: Beryle Beams, MD;  Location: [facility];  Service: Endoscopy;  Laterality: N/A;  ergies:   No Known Allergies   Medications: I have reviewed patients current medications as documented in Epic Anti-infectives    Start     Dose/Rate Route Frequency Ordered Stop   02/28/14 1000  ceFEPIme (MAXIPIME) 2 g in dextrose 5 % 50 mL IVPB     2  g 100 mL/hr over 30 Minutes Intravenous Every 12 hours 02/28/14 0817     02/26/14 1000  ceFEPIme (MAXIPIME) 2 g in dextrose 5 % 50 mL IVPB  Status:  Discontinued     2 g 100 mL/hr over 30 Minutes Intravenous Every 24 hours 02/25/14 1153 02/28/14 0817   02/25/14 1800  vancomycin (VANCOCIN) IVPB 750 mg/150 ml premix  Status:  Discontinued     750 mg 150 mL/hr over 60 Minutes Intravenous Every 12 hours 02/25/14 1156 02/27/14 0530   02/25/14 1015  ceFEPIme (MAXIPIME) 2 g in dextrose 5 % 50 mL IVPB     2 g 100 mL/hr over 30 Minutes Intravenous  Once 02/25/14 1008 02/25/14 1139   02/25/14 1015  vancomycin (VANCOCIN) IVPB 1000 mg/200 mL premix     1,000 mg 200 mL/hr over 60 Minutes Intravenous  Once 02/25/14 1008 02/25/14 1139      Social History:  reports that he has never smoked. He has never used smokeless tobacco. He reports that he does not drink alcohol or use illicit drugs.  History reviewed. No pertinent family history.  As in HPI and primary teams notes otherwise 12 point review of systems is negative  Blood pressure 133/80, pulse 141, temperature 100.8 F (38.2 C), temperature source Axillary, resp. rate 22, height 6' (1.829 m), weight 163 lb 2.3 oz (74 kg), SpO2 97 %. General: Alert and awake, answering questions by nodding, gesturing HEENT: anicteric  sclera,  EOMI, oropharynx clear and without exudate, discolored teeth CVS regular rate, normal r,  no murmur rubs or gallops Chest: clear to auscultation bilaterally, no wheezing, rales or rhonchi Abdomen: soft nontender, nondistended, normal bowel sounds, foley in place Extremities: no  clubbing or edema noted bilaterally Skin: no rashes Neuro: nonfocal, strength and sensation intact   Results for orders placed or performed during the hospital encounter of 02/25/14 (from the past 48 hour(s))  Vancomycin, trough     Status: Abnormal   Collection Time: 02/27/14  3:59 AM  Result Value Ref Range   Vancomycin Tr 30.8 (HH) 10.0 -  20.0 ug/mL    Comment: REPEATED TO VERIFY CRITICAL RESULT CALLED TO, READ BACK BY AND VERIFIED WITH: C.DENNY,RN AT 0448 ON 02/27/14 BY W.SHEA   Comprehensive metabolic panel     Status: Abnormal   Collection Time: 02/27/14  3:59 AM  Result Value Ref Range   Sodium 142 135 - 145 mmol/L   Potassium 3.7 3.5 - 5.1 mmol/L   Chloride 112 96 - 112 mmol/L   CO2 21 19 - 32 mmol/L   Glucose, Bld 90 70 - 99 mg/dL   BUN 35 (H) 6 - 23 mg/dL   Creatinine, Ser 1.36 (H) 0.50 - 1.35 mg/dL   Calcium 7.3 (L) 8.4 - 10.5 mg/dL   Total Protein 6.1 6.0 - 8.3 g/dL   Albumin 2.1 (L) 3.5 - 5.2 g/dL   AST 75 (H) 0 - 37 U/L   ALT 38 0 - 53 U/L   Alkaline Phosphatase 406 (H) 39 - 117 U/L   Total Bilirubin 0.8 0.3 - 1.2 mg/dL   GFR calc non Af Amer 52 (L) >90 mL/min   GFR calc Af Amer 61 (L) >90 mL/min    Comment: (NOTE) The eGFR has been calculated using the CKD EPI equation. This calculation has not been validated in all clinical situations. eGFR's persistently <90 mL/min signify possible Chronic Kidney Disease.    Anion gap 9 5 - 15  CBC     Status: Abnormal   Collection Time: 02/27/14  3:59 AM  Result Value Ref Range   WBC 13.4 (H) 4.0 - 10.5 K/uL   RBC 3.89 (L) 4.22 - 5.81 MIL/uL   Hemoglobin 9.0 (L) 13.0 - 17.0 g/dL   HCT 29.8 (L) 39.0 - 52.0 %   MCV 76.6 (L) 78.0 - 100.0 fL   MCH 23.1 (L) 26.0 - 34.0 pg   MCHC 30.2 30.0 - 36.0 g/dL   RDW 17.8 (H) 11.5 - 15.5 %   Platelets 342 150 - 400 K/uL  Comprehensive metabolic panel     Status: Abnormal   Collection Time: 02/28/14  5:00 AM  Result Value Ref Range   Sodium 139 135 - 145 mmol/L   Potassium 4.3 3.5 - 5.1 mmol/L   Chloride 110 96 - 112 mmol/L   CO2 22 19 - 32 mmol/L   Glucose, Bld 100 (H) 70 - 99 mg/dL   BUN 32 (H) 6 - 23 mg/dL   Creatinine, Ser 1.29 0.50 - 1.35 mg/dL   Calcium 7.4 (L) 8.4 - 10.5 mg/dL   Total Protein 6.0 6.0 - 8.3 g/dL   Albumin 1.8 (L) 3.5 - 5.2 g/dL   AST 112 (H) 0 - 37 U/L   ALT 56 (H) 0 - 53 U/L   Alkaline  Phosphatase 443 (H) 39 - 117 U/L   Total Bilirubin 0.7 0.3 - 1.2 mg/dL   GFR calc non Af Wyvonnia Lora  56 (L) >90 mL/min   GFR calc Af Amer 65 (L) >90 mL/min    Comment: (NOTE) The eGFR has been calculated using the CKD EPI equation. This calculation has not been validated in all clinical situations. eGFR's persistently <90 mL/min signify possible Chronic Kidney Disease.    Anion gap 7 5 - 15  CBC     Status: Abnormal   Collection Time: 02/28/14  5:00 AM  Result Value Ref Range   WBC 12.2 (H) 4.0 - 10.5 K/uL   RBC 3.46 (L) 4.22 - 5.81 MIL/uL   Hemoglobin 8.2 (L) 13.0 - 17.0 g/dL   HCT 26.2 (L) 39.0 - 52.0 %   MCV 75.7 (L) 78.0 - 100.0 fL   MCH 23.7 (L) 26.0 - 34.0 pg   MCHC 31.3 30.0 - 36.0 g/dL   RDW 17.9 (H) 11.5 - 15.5 %   Platelets 316 150 - 400 K/uL   '@BRIEFLABTABLE'$ (sdes,specrequest,cult,reptstatus)   ) Recent Results (from the past 720 hour(s))  Blood Culture (routine x 2)     Status: None (Preliminary result)   Collection Time: 02/25/14 10:11 AM  Result Value Ref Range Status   Specimen Description BLOOD RIGHT WRIST  Final   Special Requests BOTTLES DRAWN AEROBIC AND ANAEROBIC 5 CC EA  Final   Culture   Final           BLOOD CULTURE RECEIVED NO GROWTH TO DATE CULTURE WILL BE HELD FOR 5 DAYS BEFORE ISSUING A FINAL NEGATIVE REPORT Performed at [facility]    Report Status PENDING  Incomplete  Blood Culture (routine x 2)     Status: None (Preliminary result)   Collection Time: 02/25/14 10:13 AM  Result Value Ref Range Status   Specimen Description BLOOD LEFT HAND  Final   Special Requests BOTTLES DRAWN AEROBIC AND ANAEROBIC 5 CC EA  Final   Culture   Final           BLOOD CULTURE RECEIVED NO GROWTH TO DATE CULTURE WILL BE HELD FOR 5 DAYS BEFORE ISSUING A FINAL NEGATIVE REPORT Performed at [facility]    Report Status PENDING  Incomplete  Urine culture     Status: None (Preliminary result)   Collection Time: 02/25/14 11:30 AM  Result Value Ref Range  Status   Specimen Description URINE, CATHETERIZED  Final   Special Requests NONE  Final   Colony Count   Final    >=100,000 COLONIES/ML Performed at [facility]    Culture   Final    ESCHERICHIA COLI Performed at [facility]    Report Status PENDING  Incomplete  MRSA PCR Screening     Status: None   Collection Time: 02/25/14  6:21 PM  Result Value Ref Range Status   MRSA by PCR NEGATIVE NEGATIVE Final    Comment:        The GeneXpert MRSA Assay (FDA approved for NASAL specimens only), is one component of a comprehensive MRSA colonization surveillance program. It is not intended to diagnose MRSA infection nor to guide or monitor treatment for MRSA infections.      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 male with history of metastatic rectal cancer, with enlarged perirectal lymph node and liver metastasis, s/p palliative XRT  deaf, mute, mental retardation,sent from [facility] SN[facility] status post unwitnessed fall and generalized weakness admitted with sepsis physiology, dirty urine and urine cx + E coli. Despite being on cefepime he is now febrile again   #1 Persistent Fevers:  Would like to see if the urine cx grows and organism S to his cefepime. If so and he is still febrile then we most definitely need to embark on FUO workup with CT chest, abdomen and pelvis etc esp given that he had piror intrabdominal abscess  I would also like to examine him for bed sores as he had decubitus ulcers, will do so tomorrow with RN staff          02/28/2014, 6:13 PM   Thank you so much for this  interesting consult  [facility] [phone] (pager) [phone] (office) 02/28/2014, 6:13 PM  [place] 02/28/2014, 6:13 PM

## 2014-02-28 NOTE — Progress Notes (Signed)
ANTIBIOTIC CONSULT NOTE - follow up  Pharmacy Consult for  Cefepime Indication: HCAP  No Known Allergies  Patient Measurements: Height: 6' (182.9 cm) Weight: 163 lb 2.3 oz (74 kg) IBW/kg (Calculated) : 77.6   Vital Signs: Temp: 97.4 F (36.3 C) (02/13 0630) Temp Source: Oral (02/13 0630) BP: 118/71 mmHg (02/13 0630) Pulse Rate: 105 (02/13 0630) Intake/Output from previous day: 02/12 0701 - 02/13 0700 In: 2325 [P.O.:600; I.V.:1725] Out: 975 [Urine:975] Intake/Output from this shift:    Labs:  Recent Labs  02/26/14 0400 02/27/14 0359 02/28/14 0500  WBC 14.1* 13.4* 12.2*  HGB 9.9* 9.0* 8.2*  PLT 377 342 316  CREATININE 1.57* 1.36* 1.29   Estimated Creatinine Clearance: 58.2 mL/min (by C-G formula based on Cr of 1.29).  Recent Labs  02/27/14 0359  VANCOTROUGH 30.8*     Microbiology: Recent Results (from the past 720 hour(s))  Blood Culture (routine x 2)     Status: None (Preliminary result)   Collection Time: 02/25/14 10:11 AM  Result Value Ref Range Status   Specimen Description BLOOD RIGHT WRIST  Final   Special Requests BOTTLES DRAWN AEROBIC AND ANAEROBIC 5 CC EA  Final   Culture   Final           BLOOD CULTURE RECEIVED NO GROWTH TO DATE CULTURE WILL BE HELD FOR 5 DAYS BEFORE ISSUING A FINAL NEGATIVE REPORT Performed at [facility]    Report Status PENDING  Incomplete  Blood Culture (routine x 2)     Status: None (Preliminary result)   Collection Time: 02/25/14 10:13 AM  Result Value Ref Range Status   Specimen Description BLOOD LEFT HAND  Final   Special Requests BOTTLES DRAWN AEROBIC AND ANAEROBIC 5 CC EA  Final   Culture   Final           BLOOD CULTURE RECEIVED NO GROWTH TO DATE CULTURE WILL BE HELD FOR 5 DAYS BEFORE ISSUING A FINAL NEGATIVE REPORT Performed at [facility]    Report Status PENDING  Incomplete  Urine culture     Status: None (Preliminary result)   Collection Time: 02/25/14 11:30 AM  Result Value Ref Range Status    Specimen Description URINE, CATHETERIZED  Final   Special Requests NONE  Final   Colony Count   Final    >=100,000 COLONIES/ML Performed at [facility]    Culture   Final    ESCHERICHIA COLI Performed at [facility]    Report Status PENDING  Incomplete  MRSA PCR Screening     Status: None   Collection Time: 02/25/14  6:21 PM  Result Value Ref Range Status   MRSA by PCR NEGATIVE NEGATIVE Final    Comment:        The GeneXpert MRSA Assay (FDA approved for NASAL specimens only), is one component of a comprehensive MRSA colonization surveillance program. It is not intended to diagnose MRSA infection nor to guide or monitor treatment for MRSA infections.     Medical History: Past Medical History  Diagnosis Date  . Mental retardation   . Organic brain syndrome   . Hypertension   . Deaf   . Clostridium difficile colitis   . Pneumonia   . Cellulitis   . Deaf   . Mutism   . Neuropathy   . Intra-abdominal abscess 12/19/2012  . Pneumoperitoneum of unknown etiology 12/19/2012  . Colon cancer 03/16/13    adenoma carcinomainvasive,mets to liver  . Anemia  iron deficiency  . Status post radiation therapy 04/17/13-04/23/13    pelvis 25Gy/5 fx    Assessment: 69 y/o M with PMH of colon cancer, mental retardation, deaf and mutism, C. diff colitis who presents with generalized weakness s/p fall this AM at [facility] facility. Code sepsis called in ED, as patient hypotensive, tachycardic, with leukocytosis, and lactic acidosis. CXR shows low inspiratory volumes with central bronchitic changes in diffuse interstitial prominence as well as nonspecific nodular opacity in R lung base. Pharmacy originally consulted to assist with dosing of Vancomycin and Cefepime for possible HCAP.    2/10 >> Vancomycin >> 2/12 2/10 >> Cefepime >>    Scr 1.29, CrCl~58.2 mls/min  Goal of Therapy:  Appropriate antibiotic dosing for renal function and  indication Eradication of infection  Plan:   Increase Cefepime 2 g IV q12h.  Monitor renal function, cultures, clinical course.  Dolly Rias RPh 02/28/2014, 8:35 AM Pager [phone]

## 2014-02-28 NOTE — Progress Notes (Signed)
Patient's temperature up to 102.3 per axilla,face flushed. Wet wash cloth placed on his forehead. Dr. Algis Liming made aware regarding this matter. RN will continue to monitor the patient.

## 2014-02-28 NOTE — Progress Notes (Signed)
PROGRESS NOTE    Victor Little MAU:[id number] DOB: September 29, 1945 DOA: 02/25/2014 PCP: [name], MD  HPI/Brief narrative 69 y.o. male with history of metastatic rectal cancer, with enlarged perirectal lymph node and liver metastasis, s/p palliative XRT and was on observation by Oncology, rectal bleeding secondary to cancer, deaf, mute, mental retardation, HTN, C. difficile, anemia sent from [facility] status post unwitnessed fall and generalized weakness. In the ED, noted to be hypotensive, tachycardic and hypoxic. Chest x-ray not suggestive of pneumonia. In and out urinary catheterization revealed cloudy urine with sediments. Patient was admitted to step down unit for severe sepsis due to UTI, dehydration, acute renal failure and hypoxia.   Assessment/Plan:    1. Severe sepsis with acute organ dysfunction: secondary to UTI. Admitted to stepdown unit. Sepsis protocol initiated in ED. Treated with aggressive IV fluid hydration, IV cefepime and vancomycin per pharmacy. Blood culture 2: Negative to date & urine culture: Pending. Sepsis physiology improved. DC'ed vancomycin. 2. E Coli UTI: IV cefepime pending culture results. Transition to oral antibiotics pending final results. 3. Severe dehydration: Possibly from poor oral intake and sepsis. Resolved after IV and oral fluid. DC IV fluids. 4. Acute Renal Failure: DD- prerenal, ATN, ACEI. Hold diuretics and ACEI. Hydrated with IV fluids and resolved. 5. Acute respiratory failure with hypoxia: Although chest x-ray has been reported as pulmonary edema, patient was clinically dry without features of CHF. VQ scan: Negative. Hypoxia resolved. 6. Chronic anemia: Some of the hemoglobin drop was probably dilutional. Hemoglobin level telemetry dropping 10.7 on admission >8.2 today. Follow CBC in a.m. Likely related to chronic disease and malignancy. 7. Hypotension: Secondary to sepsis and dehydration. Hold metoprolol and ACEI. IV fluids.  Blood pressures improved.  8. History of organic brain syndrome/deaf/mute: Continue scheduled Xanax that patient was on at [facility] and Neurontin. Able to communicate some with sign language. 9. Metastatic rectal cancer: As per primary oncologist-patient lost to outpatient follow-up. Consider outpatient follow-up through SNF.. 10. Lung nodules in right apex and right base: Seen on initial chest x-ray. Repeat chest x-ray suggests multiple pulmonary nodules-consider outpatient CT scan for further evaluation. 11. Mild sinus tachycardia: Heart rate in the 110s-120s. Check EKG and TSH. Asymptomatic and stable.  Code Status: Partial code: Intubation desired Family Communication: Discuss with patient's sister Ms. [name], updated care and answered questions. Disposition Plan: Possible DC to SNF in 1-2 days.   Consultants:  None  Procedures:  None  Antibiotics:  IV vancomycin 2/10 > DC'd  IV cefepime 2/10 >   Subjective: Patient communicated with nursing aide using sign language and stated that he was doing fine and denied any complaints.  Objective: Filed Vitals:   02/27/14 1200 02/27/14 1500 02/27/14 2129 02/28/14 0630  BP: 115/67  90/59 118/71  Pulse: 121  115 105  Temp:  97.8 F (36.6 C) 98 F (36.7 C) 97.4 F (36.3 C)  TempSrc:  Oral Oral Oral  Resp: 16  16 16   Height:      Weight:    74 kg (163 lb 2.3 oz)  SpO2: 99%  98% 97%    Intake/Output Summary (Last 24 hours) at 02/28/14 1346 Last data filed at 02/28/14 1207  Gross per 24 hour  Intake   2343 ml  Output   1975 ml  Net    368 ml   Filed Weights   02/25/14 1747 02/26/14 0359 02/28/14 0630  Weight: 68.5 kg (151 lb 0.2 oz) 69.8 kg (153 lb 14.1  oz) 74 kg (163 lb 2.3 oz)     Exam:  General exam: Pleasant middle-aged male sitting up comfortably in bed and eating breakfast by himself. Respiratory system: Clear. No increased work of breathing. Cardiovascular system: S1 & S2 heard, RRR. No JVD, murmurs, gallops,  clicks or pedal edema.  Gastrointestinal system: Abdomen is nondistended, soft and nontender. Normal bowel sounds heard. Condom cath + Central nervous system: Alert and communicating appropriately using sign language with nursing aide. No focal neurological deficits. Extremities: Symmetric 5 x 5 power.   Data Reviewed: Basic Metabolic Panel:  Recent Labs Lab 02/25/14 1012 02/26/14 0400 02/27/14 0359 02/28/14 0500  NA 143 146* 142 139  K 4.3 3.8 3.7 4.3  CL 103 115* 112 110  CO2 28 21 21 22   GLUCOSE 94 99 90 100*  BUN 53* 43* 35* 32*  CREATININE 1.74* 1.57* 1.36* 1.29  CALCIUM 8.6 7.6* 7.3* 7.4*   Liver Function Tests:  Recent Labs Lab 02/25/14 1012 02/26/14 0400 02/27/14 0359 02/28/14 0500  AST 69* 59* 75* 112*  ALT 39 33 38 56*  ALKPHOS 453* 379* 406* 443*  BILITOT 0.9 0.9 0.8 0.7  PROT 8.4* 7.0 6.1 6.0  ALBUMIN 2.8* 2.4* 2.1* 1.8*   No results for input(s): LIPASE, AMYLASE in the last 168 hours. No results for input(s): AMMONIA in the last 168 hours. CBC:  Recent Labs Lab 02/25/14 1012 02/26/14 0400 02/27/14 0359 02/28/14 0500  WBC 14.9* 14.1* 13.4* 12.2*  NEUTROABS 12.2*  --   --   --   HGB 10.7* 9.9* 9.0* 8.2*  HCT 35.3* 32.5* 29.8* 26.2*  MCV 76.6* 76.5* 76.6* 75.7*  PLT 488* 377 342 316   Cardiac Enzymes: No results for input(s): CKTOTAL, CKMB, CKMBINDEX, TROPONINI in the last 168 hours. BNP (last 3 results) No results for input(s): PROBNP in the last 8760 hours. CBG: No results for input(s): GLUCAP in the last 168 hours.  Recent Results (from the past 240 hour(s))  Blood Culture (routine x 2)     Status: None (Preliminary result)   Collection Time: 02/25/14 10:11 AM  Result Value Ref Range Status   Specimen Description BLOOD RIGHT WRIST  Final   Special Requests BOTTLES DRAWN AEROBIC AND ANAEROBIC 5 CC EA  Final   Culture   Final           BLOOD CULTURE RECEIVED NO GROWTH TO DATE CULTURE WILL BE HELD FOR 5 DAYS BEFORE ISSUING A FINAL  NEGATIVE REPORT Performed at [facility]    Report Status PENDING  Incomplete  Blood Culture (routine x 2)     Status: None (Preliminary result)   Collection Time: 02/25/14 10:13 AM  Result Value Ref Range Status   Specimen Description BLOOD LEFT HAND  Final   Special Requests BOTTLES DRAWN AEROBIC AND ANAEROBIC 5 CC EA  Final   Culture   Final           BLOOD CULTURE RECEIVED NO GROWTH TO DATE CULTURE WILL BE HELD FOR 5 DAYS BEFORE ISSUING A FINAL NEGATIVE REPORT Performed at [facility]    Report Status PENDING  Incomplete  Urine culture     Status: None (Preliminary result)   Collection Time: 02/25/14 11:30 AM  Result Value Ref Range Status   Specimen Description URINE, CATHETERIZED  Final   Special Requests NONE  Final   Colony Count   Final    >=100,000 COLONIES/ML Performed at [facility]  Final    ESCHERICHIA COLI Performed at [facility]    Report Status PENDING  Incomplete  MRSA PCR Screening     Status: None   Collection Time: 02/25/14  6:21 PM  Result Value Ref Range Status   MRSA by PCR NEGATIVE NEGATIVE Final    Comment:        The GeneXpert MRSA Assay (FDA approved for NASAL specimens only), is one component of a comprehensive MRSA colonization surveillance program. It is not intended to diagnose MRSA infection nor to guide or monitor treatment for MRSA infections.           Studies: Dg Chest 2 View  02/26/2014   CLINICAL DATA:  Subsequent encounter for followup of pulmonary nodule.  EXAM: CHEST  2 VIEW  COMPARISON:  02/25/2014 and CT of 03/15/2013.  FINDINGS: Numerous leads and wires project over the chest. Patient rotated right. Normal heart size. Left hemidiaphragm elevation is mild. No pleural effusion or pneumothorax. The nodular density projecting over the right lung base is not readily apparent. Right upper lobe nodular density is not readily apparent. This area is suboptimally evaluated secondary  to obliquity. There is nodularity within both lungs. Potential left apical nodule measures 1.1 cm. Smaller bilateral mid lung nodules. Left-sided nodule may be calcified and represent a granuloma.  No lobar consolidation.  IMPRESSION: Scattered pulmonary nodules suspected. The right upper lobe/ apical nodule described on the prior exam is not readily apparent and may have represented summation of osseous shadows. Regardless, given the history of colon cancer and small pulmonary nodules on 03/15/2013 CT (which is motion degraded), followup with CT should be considered.   Electronically Signed   By: Abigail Miyamoto M.D.   On: 02/26/2014 15:21   Nm Pulmonary Perfusion  02/26/2014   CLINICAL DATA:  Short of breath. Concern pulmonary embolism. History of colorectal malignancy.  EXAM: NUCLEAR MEDICINE  PERFUSION LUNG SCAN  TECHNIQUE: Patient refused ventilation portion exam. Perfusion images were obtained in multiple projections after intravenous injection of Tc-20m MAA.  RADIOPHARMACEUTICALS:  5.2 mCi Tc-43m MAA.  COMPARISON:  Radiograph 02/26/2014  FINDINGS: No wedge-shaped peripheral perfusion defects within the left or right lung to suggest acute pulmonary embolism.  IMPRESSION: No evidence of acute pulmonary embolism.   Electronically Signed   By: Suzy Bouchard M.D.   On: 02/26/2014 14:35        Scheduled Meds: . ALPRAZolam  0.25 mg Oral TID  . ceFEPime (MAXIPIME) IV  2 g Intravenous Q12H  . divalproex  125 mg Oral QHS  . enoxaparin (LOVENOX) injection  40 mg Subcutaneous Q24H  . feeding supplement (ENSURE COMPLETE)  237 mL Oral TID BM  . gabapentin  300 mg Oral TID  . loratadine  10 mg Oral Daily  . multivitamin with minerals  1 tablet Oral Daily  . saccharomyces boulardii  250 mg Oral q morning - 10a  . 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 [facility] Pager [phone]  If 7PM-7AM, please contact night-coverage  [email or web address] Password [email or web address] 02/28/2014, 1:46 PM    LOS: 3 days

## 2014-03-01 ENCOUNTER — Inpatient Hospital Stay (HOSPITAL_COMMUNITY): Payer: Medicare Other

## 2014-03-01 ENCOUNTER — Encounter (HOSPITAL_COMMUNITY): Payer: Self-pay | Admitting: Radiology

## 2014-03-01 DIAGNOSIS — R509 Fever, unspecified: Secondary | ICD-10-CM

## 2014-03-01 DIAGNOSIS — D72829 Elevated white blood cell count, unspecified: Secondary | ICD-10-CM

## 2014-03-01 DIAGNOSIS — N189 Chronic kidney disease, unspecified: Secondary | ICD-10-CM

## 2014-03-01 LAB — COMPREHENSIVE METABOLIC PANEL
ALK PHOS: 421 U/L — AB (ref 39–117)
ALT: 56 U/L — ABNORMAL HIGH (ref 0–53)
ANION GAP: 8 (ref 5–15)
AST: 94 U/L — ABNORMAL HIGH (ref 0–37)
Albumin: 2 g/dL — ABNORMAL LOW (ref 3.5–5.2)
BILIRUBIN TOTAL: 0.7 mg/dL (ref 0.3–1.2)
BUN: 34 mg/dL — ABNORMAL HIGH (ref 6–23)
CHLORIDE: 114 mmol/L — AB (ref 96–112)
CO2: 22 mmol/L (ref 19–32)
Calcium: 7.8 mg/dL — ABNORMAL LOW (ref 8.4–10.5)
Creatinine, Ser: 1.48 mg/dL — ABNORMAL HIGH (ref 0.50–1.35)
GFR calc Af Amer: 55 mL/min — ABNORMAL LOW (ref >90)
GFR, EST NON AFRICAN AMERICAN: 47 mL/min — AB (ref >90)
GLUCOSE: 108 mg/dL — AB (ref 70–99)
POTASSIUM: 5 mmol/L (ref 3.5–5.1)
Sodium: 144 mmol/L (ref 135–145)
TOTAL PROTEIN: 6.3 g/dL (ref 6.0–8.3)

## 2014-03-01 LAB — CBC
HCT: 29.4 % — ABNORMAL LOW (ref 39.0–52.0)
HEMOGLOBIN: 9.1 g/dL — AB (ref 13.0–17.0)
MCH: 23.6 pg — ABNORMAL LOW (ref 26.0–34.0)
MCHC: 31 g/dL (ref 30.0–36.0)
MCV: 76.4 fL — ABNORMAL LOW (ref 78.0–100.0)
Platelets: 320 10*3/uL (ref 150–400)
RBC: 3.85 MIL/uL — AB (ref 4.22–5.81)
RDW: 18.8 % — AB (ref 11.5–15.5)
WBC: 21.8 10*3/uL — AB (ref 4.0–10.5)

## 2014-03-01 LAB — URINE CULTURE: Colony Count: >=100000

## 2014-03-01 MED ORDER — IOHEXOL 300 MG/ML  SOLN
80.0000 mL | Freq: Once | INTRAMUSCULAR | Status: AC | PRN
  Administered 2014-03-01: 80 mL via INTRAVENOUS

## 2014-03-01 MED ORDER — SODIUM CHLORIDE 0.9 % IV SOLN
INTRAVENOUS | Status: DC
  Administered 2014-03-01: 22:00:00 via INTRAVENOUS
  Administered 2014-03-01: 1000 mL via INTRAVENOUS
  Administered 2014-03-02: 06:00:00 via INTRAVENOUS

## 2014-03-01 NOTE — Progress Notes (Signed)
Addendum  Bilateral lower extremity venous Dopplers: Negative for DVT.  CT chest, abdomen & pelvis: Progressive pulmonary and hepatic metastasis and enlarging mediastinal/retroperitoneal lymph nodes. New moderate right and moderate to severe left hydroureteronephrosis extending toward distended bladder. No definite obstructing cause identified. Mild rectal wall thickening and irregularity with adjacent stranding-recurrent/residual tumor is not excluded.  Plan 1. Place Foley catheter 2. Discussed with family/sister in a.m. regarding palliative care consultation for goals of care.  Vernell Leep, MD, FACP, FHM. [facility] Pager [phone]  If 7PM-7AM, please contact night-coverage [email or web address] Password [facility] 03/01/2014, 6:15 PM

## 2014-03-01 NOTE — Progress Notes (Signed)
Dr.Hongalgi made aware of CT abdomen,chest,pelvis results. New order for foley catheter. Inserted at 1810,x1 attempt,patient tolerated the procedure. Noted 150cc of clear, yellow urine in the drainage bag.

## 2014-03-01 NOTE — Progress Notes (Signed)
Orders received. Unable to complete evaluation this date. Will f/u 2/15.   [place], [place] [phone]

## 2014-03-01 NOTE — Progress Notes (Signed)
*  Preliminary Results* Bilateral lower extremity venous duplex completed. Bilateral lower extremities are negative for deep vein thrombosis. There is no evidence of Baker's cyst bilaterally.  03/01/2014  Maudry Mayhew, RVT, RDCS, RDMS

## 2014-03-01 NOTE — Progress Notes (Signed)
PROGRESS NOTE    Victor Little SWN:[id number] DOB: 1945-06-05 DOA: 02/25/2014 PCP: [name], MD  HPI/Brief narrative 69 y.o. male with history of metastatic rectal cancer, with enlarged perirectal lymph node and liver metastasis, s/p palliative XRT and was on observation by Oncology, rectal bleeding secondary to cancer, deaf, mute, mental retardation, HTN, C. difficile, anemia sent from [facility] status post unwitnessed fall and generalized weakness. In the ED, noted to be hypotensive, tachycardic and hypoxic. Chest x-ray not suggestive of pneumonia. In and out urinary catheterization revealed cloudy urine with sediments. Patient was admitted to step down unit for severe sepsis due to UTI, dehydration, acute renal failure and hypoxia.   Assessment/Plan:    1. FUO: Initially treated for sepsis secondary to Escherichia coli UTI. Patient was afebrile and doing well until 2/13 when he started to spike fevers. Etiology unclear. Clinically no obvious new source. No bedsores. ID following. Obtain CT chest abdomen and pelvis-if possible with contrast. We'll also get lower extremity venous Dopplers to rule out DVT. 2. Severe sepsis with acute organ dysfunction: secondary to UTI. Admitted to stepdown unit. Sepsis protocol initiated in ED. Treated with aggressive IV fluid hydration, IV cefepime and vancomycin per pharmacy. Blood culture 2: Negative to date & urine culture: Escherichia coli-sensitivity pending. DC'ed vancomycin. Again spiking fevers since 2/13. 3. E Coli UTI: IV cefepime pending culture results. Management as above. 4. Severe dehydration: Possibly from poor oral intake and sepsis. Resolved after IV and oral fluid. Patient drinking well. Creatinine however has increased slightly. We'll start IV fluids due to fevers. 5. Acute Renal Failure: DD- prerenal, ATN, ACEI. Hold diuretics and ACEI. Acute renal failure had resolved but creatinine again higher today, possibly  secondary to fever and insensible loss. IV fluids. 6. Acute respiratory failure with hypoxia: Although chest x-ray has been reported as pulmonary edema, patient was clinically dry without features of CHF. VQ scan: Negative. Hypoxia resolved. 7. Chronic anemia: Some of the hemoglobin drop was probably dilutional. Stable. 8. Hypotension: Secondary to sepsis and dehydration. Hold metoprolol and ACEI. IV fluids. Resolved. 9. History of organic brain syndrome/deaf/mute: Continue scheduled Xanax that patient was on at [facility] and Neurontin. Able to communicate some with sign language. 10. Metastatic rectal cancer: As per primary oncologist-patient lost to outpatient follow-up. Consider outpatient follow-up through SNF.Marland Kitchen 11. Lung nodules in right apex and right base: Seen on initial chest x-ray. Repeat chest x-ray suggests multiple pulmonary nodules-consider outpatient CT scan for further evaluation. 12. Mild sinus tachycardia: Heart rate in the 110s-120s. Check EKG and TSH. Asymptomatic and stable. Now precipitated by fevers. 13. Leukocytosis: Secondary to problem #1. No reported diarrhea.  Code Status: Partial code: Intubation desired Family Communication: Discussed with patient's sister Ms. [name], updated care and answered questions. Disposition Plan: DC to SNF when stable- not stable for DC.   Consultants:  ID  Procedures:  None  Antibiotics:  IV vancomycin 2/10 > DC'd  IV cefepime 2/10 >   Subjective: Per nursing, continues with low grade fever. Eating and drinking very well. At times coughs while drinking. Tremors RUE.  Objective: Filed Vitals:   02/28/14 2108 03/01/14 0524 03/01/14 0822 03/01/14 1140  BP: 103/63 122/74    Pulse: 113 130    Temp: 98.1 F (36.7 C) 98.5 F (36.9 C) 98.9 F (37.2 C) 100.9 F (38.3 C)  TempSrc: Axillary Axillary Axillary Axillary  Resp: 16 16    Height:      Weight:  SpO2: 99% 100%      Intake/Output Summary (Last 24 hours) at  03/01/14 1214 Last data filed at 03/01/14 0900  Gross per 24 hour  Intake   1060 ml  Output   2175 ml  Net  -1115 ml   Filed Weights   02/25/14 1747 02/26/14 0359 02/28/14 0630  Weight: 68.5 kg (151 lb 0.2 oz) 69.8 kg (153 lb 14.1 oz) 74 kg (163 lb 2.3 oz)     Exam:  General exam: Pleasant middle-aged male lying comfortably in bed. Respiratory system: Clear. No increased work of breathing. Cardiovascular system: S1 & S2 heard, regular tachycardia. No JVD, murmurs, gallops, clicks or pedal edema.  Gastrointestinal system: Abdomen is nondistended, soft and nontender. Normal bowel sounds heard. Condom cath + Central nervous system: Alert and communicating appropriately using sign language with nursing aide. No focal neurological deficits. Extremities: Symmetric 5 x 5 power.??? Mild asymmetric right lower extremity swelling. Skin: No obvious rashes or open wounds.   Data Reviewed: Basic Metabolic Panel:  Recent Labs Lab 02/25/14 1012 02/26/14 0400 02/27/14 0359 02/28/14 0500 03/01/14 0444  NA 143 146* 142 139 144  K 4.3 3.8 3.7 4.3 5.0  CL 103 115* 112 110 114*  CO2 28 21 21 22 22   GLUCOSE 94 99 90 100* 108*  BUN 53* 43* 35* 32* 34*  CREATININE 1.74* 1.57* 1.36* 1.29 1.48*  CALCIUM 8.6 7.6* 7.3* 7.4* 7.8*   Liver Function Tests:  Recent Labs Lab 02/25/14 1012 02/26/14 0400 02/27/14 0359 02/28/14 0500 03/01/14 0444  AST 69* 59* 75* 112* 94*  ALT 39 33 38 56* 56*  ALKPHOS 453* 379* 406* 443* 421*  BILITOT 0.9 0.9 0.8 0.7 0.7  PROT 8.4* 7.0 6.1 6.0 6.3  ALBUMIN 2.8* 2.4* 2.1* 1.8* 2.0*   No results for input(s): LIPASE, AMYLASE in the last 168 hours. No results for input(s): AMMONIA in the last 168 hours. CBC:  Recent Labs Lab 02/25/14 1012 02/26/14 0400 02/27/14 0359 02/28/14 0500 03/01/14 0444  WBC 14.9* 14.1* 13.4* 12.2* 21.8*  NEUTROABS 12.2*  --   --   --   --   HGB 10.7* 9.9* 9.0* 8.2* 9.1*  HCT 35.3* 32.5* 29.8* 26.2* 29.4*  MCV 76.6* 76.5*  76.6* 75.7* 76.4*  PLT 488* 377 342 316 320   Cardiac Enzymes: No results for input(s): CKTOTAL, CKMB, CKMBINDEX, TROPONINI in the last 168 hours. BNP (last 3 results) No results for input(s): PROBNP in the last 8760 hours. CBG: No results for input(s): GLUCAP in the last 168 hours.  Recent Results (from the past 240 hour(s))  Blood Culture (routine x 2)     Status: None (Preliminary result)   Collection Time: 02/25/14 10:11 AM  Result Value Ref Range Status   Specimen Description BLOOD RIGHT WRIST  Final   Special Requests BOTTLES DRAWN AEROBIC AND ANAEROBIC 5 CC EA  Final   Culture   Final           BLOOD CULTURE RECEIVED NO GROWTH TO DATE CULTURE WILL BE HELD FOR 5 DAYS BEFORE ISSUING A FINAL NEGATIVE REPORT Performed at [facility]    Report Status PENDING  Incomplete  Blood Culture (routine x 2)     Status: None (Preliminary result)   Collection Time: 02/25/14 10:13 AM  Result Value Ref Range Status   Specimen Description BLOOD LEFT HAND  Final   Special Requests BOTTLES DRAWN AEROBIC AND ANAEROBIC 5 CC EA  Final   Culture   Final  BLOOD CULTURE RECEIVED NO GROWTH TO DATE CULTURE WILL BE HELD FOR 5 DAYS BEFORE ISSUING A FINAL NEGATIVE REPORT Performed at [facility]    Report Status PENDING  Incomplete  Urine culture     Status: None (Preliminary result)   Collection Time: 02/25/14 11:30 AM  Result Value Ref Range Status   Specimen Description URINE, CATHETERIZED  Final   Special Requests NONE  Final   Colony Count   Final    >=100,000 COLONIES/ML Performed at [facility]    Culture   Final    ESCHERICHIA COLI Performed at [facility]    Report Status PENDING  Incomplete  MRSA PCR Screening     Status: None   Collection Time: 02/25/14  6:21 PM  Result Value Ref Range Status   MRSA by PCR NEGATIVE NEGATIVE Final    Comment:        The GeneXpert MRSA Assay (FDA approved for NASAL specimens only), is one component of  a comprehensive MRSA colonization surveillance program. It is not intended to diagnose MRSA infection nor to guide or monitor treatment for MRSA infections.           Studies: No results found.      Scheduled Meds: . ALPRAZolam  0.25 mg Oral TID  . ceFEPime (MAXIPIME) IV  2 g Intravenous Q12H  . divalproex  125 mg Oral QHS  . enoxaparin (LOVENOX) injection  40 mg Subcutaneous Q24H  . feeding supplement (ENSURE COMPLETE)  237 mL Oral TID BM  . gabapentin  300 mg Oral TID  . loratadine  10 mg Oral Daily  . multivitamin with minerals  1 tablet Oral Daily  . saccharomyces boulardii  250 mg Oral q morning - 10a  . 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 [facility] Pager [phone]  If 7PM-7AM, please contact night-coverage [email or web address] Password [email or web address] 03/01/2014, 12:14 PM    LOS: 4 days

## 2014-03-02 DIAGNOSIS — C801 Malignant (primary) neoplasm, unspecified: Secondary | ICD-10-CM

## 2014-03-02 DIAGNOSIS — C7800 Secondary malignant neoplasm of unspecified lung: Secondary | ICD-10-CM

## 2014-03-02 DIAGNOSIS — C787 Secondary malignant neoplasm of liver and intrahepatic bile duct: Secondary | ICD-10-CM

## 2014-03-02 DIAGNOSIS — B9620 Unspecified Escherichia coli [E. coli] as the cause of diseases classified elsewhere: Secondary | ICD-10-CM

## 2014-03-02 DIAGNOSIS — R509 Fever, unspecified: Secondary | ICD-10-CM | POA: Insufficient documentation

## 2014-03-02 DIAGNOSIS — C20 Malignant neoplasm of rectum: Secondary | ICD-10-CM

## 2014-03-02 DIAGNOSIS — N1330 Unspecified hydronephrosis: Secondary | ICD-10-CM

## 2014-03-02 DIAGNOSIS — D630 Anemia in neoplastic disease: Secondary | ICD-10-CM

## 2014-03-02 LAB — TSH: TSH: 1.887 u[IU]/mL (ref 0.350–4.500)

## 2014-03-02 LAB — CBC
HEMATOCRIT: 27.5 % — AB (ref 39.0–52.0)
Hemoglobin: 8.3 g/dL — ABNORMAL LOW (ref 13.0–17.0)
MCH: 22.7 pg — ABNORMAL LOW (ref 26.0–34.0)
MCHC: 30.2 g/dL (ref 30.0–36.0)
MCV: 75.3 fL — ABNORMAL LOW (ref 78.0–100.0)
Platelets: 300 10*3/uL (ref 150–400)
RBC: 3.65 MIL/uL — AB (ref 4.22–5.81)
RDW: 18.7 % — ABNORMAL HIGH (ref 11.5–15.5)
WBC: 17.5 10*3/uL — AB (ref 4.0–10.5)

## 2014-03-02 LAB — COMPREHENSIVE METABOLIC PANEL
ALBUMIN: 1.8 g/dL — AB (ref 3.5–5.2)
ALK PHOS: 334 U/L — AB (ref 39–117)
ALT: 45 U/L (ref 0–53)
AST: 73 U/L — AB (ref 0–37)
Anion gap: 8 (ref 5–15)
BUN: 31 mg/dL — AB (ref 6–23)
CO2: 22 mmol/L (ref 19–32)
CREATININE: 1.34 mg/dL (ref 0.50–1.35)
Calcium: 7.4 mg/dL — ABNORMAL LOW (ref 8.4–10.5)
Chloride: 108 mmol/L (ref 96–112)
GFR calc Af Amer: 62 mL/min — ABNORMAL LOW (ref >90)
GFR calc non Af Amer: 53 mL/min — ABNORMAL LOW (ref >90)
Glucose, Bld: 107 mg/dL — ABNORMAL HIGH (ref 70–99)
POTASSIUM: 4.4 mmol/L (ref 3.5–5.1)
SODIUM: 138 mmol/L (ref 135–145)
Total Bilirubin: 0.7 mg/dL (ref 0.3–1.2)
Total Protein: 6.2 g/dL (ref 6.0–8.3)

## 2014-03-02 MED ORDER — METRONIDAZOLE IN NACL 5-0.79 MG/ML-% IV SOLN
500.0000 mg | Freq: Three times a day (TID) | INTRAVENOUS | Status: DC
  Administered 2014-03-02 – 2014-03-03 (×3): 500 mg via INTRAVENOUS
  Filled 2014-03-02 (×4): qty 100

## 2014-03-02 MED ORDER — CEFTRIAXONE SODIUM IN DEXTROSE 20 MG/ML IV SOLN
1.0000 g | INTRAVENOUS | Status: DC
  Administered 2014-03-03: 1 g via INTRAVENOUS
  Filled 2014-03-02: qty 50

## 2014-03-02 MED ORDER — SODIUM CHLORIDE 0.9 % IV SOLN
INTRAVENOUS | Status: DC
  Administered 2014-03-02 – 2014-03-03 (×3): via INTRAVENOUS

## 2014-03-02 MED ORDER — RESOURCE THICKENUP CLEAR PO POWD
ORAL | Status: DC | PRN
  Filled 2014-03-02: qty 125

## 2014-03-02 NOTE — Progress Notes (Addendum)
CSW continuing to follow.   Pt admitted from [facility].   Per unit progression meeting, pt not yet medically ready for discharge today.  CSW reviewed chart and noted that per MD note, MD plans to have discussion with pt sister/guardian regarding palliative care consultation for goals of care.   CSW notified [place] that pt not yet medically ready for discharge.   CSW to continue to follow.  Addendum 2:20 pm:  CSW received phone call from pt RN stating that pt oncologist, Dr. Benay Spice met with pt at bedside with sign language interpreter to discuss disposition planning and transitioning to comfort care. Per RN, Dr. Benay Spice discussed with pt and pt sister/guardian, Thayer Headings via telephone about residential hospice specifically [facility] and pt and pt sister are agreeable. Dr. Benay Spice requesting for [facility] referral to be initiated.   CSW contacted [employer], Erling Conte and left message with referral for [facility]. Awaiting response.  CSW to continue to follow to assist with pt residential hospice placement.  Addendum 2:50 pm:  CSW received notification from [facility], Erling Conte that [facility] is currently full, but [employer], Erling Conte will work up referral as pt is not yet medically ready for discharge.  CSW contacted pt sister, [name] via telephone to update. CSW explained to pt sister that per [facility], facility is currently full, but pt will be placed on list. CSW explained to pt sister that pt is not yet medically ready for discharge, but if [facility] is full when pt medically ready then pt cannot remain in the hospital to await a bed at [facility]. Pt sister expressed understanding. CSW discussed secondary options including referral to other hospice homes vs. Pt returning to [facility] with full hospice services as pt was at [facility] under [place] [employer]. Pt sister shared  that she preferred for pt to be somewhere that was easy for her to visit and if [facility] is full when pt medically ready for discharge then she would prefer for pt to return to [facility] with full hospice services. CSW expressed understanding and discuss with pt sister that CSW will keep pt sister updated regarding anticipated discharge date and [facility] availability. Pt sister appreciative and expressed eagerness about which place pt may go upon discharge.   Alison Murray, MSW, [place] [facility] [phone]

## 2014-03-02 NOTE — Progress Notes (Addendum)
PROGRESS NOTE    Victor Little DDU:[id number] DOB: [birth year] DOA: 02/25/2014 PCP: [name], MD  HPI/Brief narrative 69 y.o. male with history of metastatic rectal cancer, with enlarged perirectal lymph node and liver metastasis, s/p palliative XRT and was on observation by Oncology, rectal bleeding secondary to cancer, deaf, mute, mental retardation, HTN, C. difficile, anemia sent from [facility] status post unwitnessed fall and generalized weakness. In the ED, noted to be hypotensive, tachycardic and hypoxic. Chest x-ray not suggestive of pneumonia. In and out urinary catheterization revealed cloudy urine with sediments. Patient was admitted to step down unit for severe sepsis due to UTI, dehydration, acute renal failure and hypoxia.   Assessment/Plan:    1. FUO: Initially treated for sepsis secondary to Escherichia coli UTI. Patient was afebrile and doing well until 2/13 when he started to spike fevers. Etiology unclear - ? Abx/Tumor related . Clinically no obvious new source. No bedsores. ID following. CT chest abdomen and pelvis- no overt source. However widely metastatic cancer to lungs, liver, mediastinal and retroperitoneal lymph nodes. Lower extremity venous Dopplers-negative for DVT. 2. Severe sepsis with acute organ dysfunction: secondary to UTI. Admitted to stepdown unit. Sepsis protocol initiated in ED. Treated with aggressive IV fluid hydration, IV cefepime and vancomycin per pharmacy. Blood culture 2: Negative to date & urine culture: Escherichia coli-sensitivity appreciated. DC'ed vancomycin. Continues to have fevers. Change cefepime to IV Rocephin. 3. E Coli UTI: IV cefepime >IV Rocephin. Management as above. 4. Severe dehydration: Possibly from poor oral intake and sepsis. Resolved after IV and oral fluid. Patient drinking well. Creatinine however has increased slightly. We'll start IV fluids due to fevers. 5. Acute Renal Failure: DD-  obstruction/hydronephrosis, prerenal, ATN, ACEI. Hold diuretics and ACEI. Acute renal failure had resolved but creatinine again higher 2/14, possibly secondary to fever and insensible loss. IV fluids. Improving. 6. Acute respiratory failure with hypoxia: Although chest x-ray has been reported as pulmonary edema, patient was clinically dry without features of CHF. VQ scan: Negative. Hypoxia resolved. 7. Chronic anemia: Some of the hemoglobin drop was probably dilutional. Stable/fluctuating. 8. Hypotension: Secondary to sepsis and dehydration. Hold metoprolol and ACEI. IV fluids. Resolved. 9. History of organic brain syndrome/deaf/mute: Continue scheduled Xanax that patient was on at [facility] and Neurontin. Able to communicate some with sign language. 10. Metastatic rectal cancer: CT chest, abdomen and pelvis with contrast shows widely metastatic cancer. Discussed with Dr. Benay Spice: Not a candidate for chemotherapy and ideal for hospice. Dr. Benay Spice will discuss with patient and family this afternoon. 11. Mild sinus tachycardia: TSH: 1.887. EKG 2/15: Sinus tachycardia at 127 bpm without acute findings. Precipitated by fever. Asymptomatic and stable.  12. Leukocytosis: Secondary to problem #1. No reported diarrhea. 13. Urinary retention and bilateral hydroureteronephrosis:? Secondary to prostatomegaly. Foley inserted. Consider renal ultrasound in a day or  14. Non-severe (moderate) malnutrition in the context of chronic illness: Management per dietitian evaluation.   Code Status: Partial code: Intubation desired Family Communication: Discussed with patient's sister Ms. [name], updated care and answered questions. Await oncology discussion with patient and family regarding considering hospice. Disposition Plan: DC to SNF when stable- not stable for DC.   Consultants:  ID  Oncology  Procedures:  None  Antibiotics:  IV vancomycin 2/10 > DC'd  IV cefepime 2/10 >2/15  IV Rocephin    Subjective: Spiking fevers. Otherwise no acute issues as per nursing.  Objective: Filed Vitals:   03/01/14 1350 03/01/14 2045 03/02/14 2376 03/02/14 2831  BP: 92/48 116/74 129/64   Pulse: 142 126 143   Temp: 100.9 F (38.3 C) 97.5 F (36.4 C) 101.7 F (38.7 C) 101 F (38.3 C)  TempSrc: Axillary Oral Axillary Axillary  Resp: 20 20 20    Height:      Weight:      SpO2: 98% 98% 98%     Intake/Output Summary (Last 24 hours) at 03/02/14 1300 Last data filed at 03/02/14 0919  Gross per 24 hour  Intake 2075.75 ml  Output   4176 ml  Net -2100.25 ml   Filed Weights   02/25/14 1747 02/26/14 0359 02/28/14 0630  Weight: 68.5 kg (151 lb 0.2 oz) 69.8 kg (153 lb 14.1 oz) 74 kg (163 lb 2.3 oz)     Exam:  General exam: Pleasant middle-aged male lying comfortably in bed. Respiratory system: Clear. No increased work of breathing. Cardiovascular system: S1 & S2 heard, regular tachycardia. No JVD, murmurs, gallops, clicks or pedal edema.  Gastrointestinal system: Abdomen is nondistended, soft and nontender. Normal bowel sounds heard. Foley cath + Central nervous system: Alert and follows instructions. No focal neurological deficits. Extremities: Symmetric 5 x 5 power. Skin: No obvious rashes or open wounds.   Data Reviewed: Basic Metabolic Panel:  Recent Labs Lab 02/26/14 0400 02/27/14 0359 02/28/14 0500 03/01/14 0444 03/02/14 0447  NA 146* 142 139 144 138  K 3.8 3.7 4.3 5.0 4.4  CL 115* 112 110 114* 108  CO2 21 21 22 22 22   GLUCOSE 99 90 100* 108* 107*  BUN 43* 35* 32* 34* 31*  CREATININE 1.57* 1.36* 1.29 1.48* 1.34  CALCIUM 7.6* 7.3* 7.4* 7.8* 7.4*   Liver Function Tests:  Recent Labs Lab 02/26/14 0400 02/27/14 0359 02/28/14 0500 03/01/14 0444 03/02/14 0447  AST 59* 75* 112* 94* 73*  ALT 33 38 56* 56* 45  ALKPHOS 379* 406* 443* 421* 334*  BILITOT 0.9 0.8 0.7 0.7 0.7  PROT 7.0 6.1 6.0 6.3 6.2  ALBUMIN 2.4* 2.1* 1.8* 2.0* 1.8*   No results for input(s):  LIPASE, AMYLASE in the last 168 hours. No results for input(s): AMMONIA in the last 168 hours. CBC:  Recent Labs Lab 02/25/14 1012 02/26/14 0400 02/27/14 0359 02/28/14 0500 03/01/14 0444 03/02/14 0447  WBC 14.9* 14.1* 13.4* 12.2* 21.8* 17.5*  NEUTROABS 12.2*  --   --   --   --   --   HGB 10.7* 9.9* 9.0* 8.2* 9.1* 8.3*  HCT 35.3* 32.5* 29.8* 26.2* 29.4* 27.5*  MCV 76.6* 76.5* 76.6* 75.7* 76.4* 75.3*  PLT 488* 377 342 316 320 300   Cardiac Enzymes: No results for input(s): CKTOTAL, CKMB, CKMBINDEX, TROPONINI in the last 168 hours. BNP (last 3 results) No results for input(s): PROBNP in the last 8760 hours. CBG: No results for input(s): GLUCAP in the last 168 hours.  Recent Results (from the past 240 hour(s))  Blood Culture (routine x 2)     Status: None (Preliminary result)   Collection Time: 02/25/14 10:11 AM  Result Value Ref Range Status   Specimen Description BLOOD RIGHT WRIST  Final   Special Requests BOTTLES DRAWN AEROBIC AND ANAEROBIC 5 CC EA  Final   Culture   Final           BLOOD CULTURE RECEIVED NO GROWTH TO DATE CULTURE WILL BE HELD FOR 5 DAYS BEFORE ISSUING A FINAL NEGATIVE REPORT Performed at [facility]    Report Status PENDING  Incomplete  Blood Culture (routine x 2)  Status: None (Preliminary result)   Collection Time: 02/25/14 10:13 AM  Result Value Ref Range Status   Specimen Description BLOOD LEFT HAND  Final   Special Requests BOTTLES DRAWN AEROBIC AND ANAEROBIC 5 CC EA  Final   Culture   Final           BLOOD CULTURE RECEIVED NO GROWTH TO DATE CULTURE WILL BE HELD FOR 5 DAYS BEFORE ISSUING A FINAL NEGATIVE REPORT Performed at [facility]    Report Status PENDING  Incomplete  Urine culture     Status: None   Collection Time: 02/25/14 11:30 AM  Result Value Ref Range Status   Specimen Description URINE, CATHETERIZED  Final   Special Requests NONE  Final   Colony Count   Final    >=100,000 COLONIES/ML Performed at [facility]    Culture   Final    ESCHERICHIA COLI Performed at [facility]    Report Status 03/01/2014 FINAL  Final   Organism ID, Bacteria ESCHERICHIA COLI  Final      Susceptibility   Escherichia coli - MIC*    AMPICILLIN 16 INTERMEDIATE Intermediate     CEFAZOLIN <=4 SENSITIVE Sensitive     CEFTRIAXONE <=1 SENSITIVE Sensitive     CIPROFLOXACIN <=0.25 SENSITIVE Sensitive     GENTAMICIN <=1 SENSITIVE Sensitive     LEVOFLOXACIN <=0.12 SENSITIVE Sensitive     NITROFURANTOIN 32 SENSITIVE Sensitive     TOBRAMYCIN <=1 SENSITIVE Sensitive     TRIMETH/SULFA <=20 SENSITIVE Sensitive     PIP/TAZO <=4 SENSITIVE Sensitive     * ESCHERICHIA COLI  MRSA PCR Screening     Status: None   Collection Time: 02/25/14  6:21 PM  Result Value Ref Range Status   MRSA by PCR NEGATIVE NEGATIVE Final    Comment:        The GeneXpert MRSA Assay (FDA approved for NASAL specimens only), is one component of a comprehensive MRSA colonization surveillance program. It is not intended to diagnose MRSA infection nor to guide or monitor treatment for MRSA infections.   Culture, blood (routine x 2)     Status: None (Preliminary result)   Collection Time: 02/28/14  4:12 PM  Result Value Ref Range Status   Specimen Description BLOOD RIGHT ARM  Final   Special Requests   Final    BOTTLES DRAWN AEROBIC AND ANAEROBIC 10CC BOTH BOTTLES   Culture   Final           BLOOD CULTURE RECEIVED NO GROWTH TO DATE CULTURE WILL BE HELD FOR 5 DAYS BEFORE ISSUING A FINAL NEGATIVE REPORT Performed at [facility]    Report Status PENDING  Incomplete  Culture, blood (routine x 2)     Status: None (Preliminary result)   Collection Time: 02/28/14  4:18 PM  Result Value Ref Range Status   Specimen Description BLOOD LEFT HAND  Final   Special Requests   Final    BOTTLES DRAWN AEROBIC AND ANAEROBIC 10CC BOTH BOTTLES.   Culture   Final           BLOOD CULTURE RECEIVED NO GROWTH TO DATE CULTURE WILL BE HELD FOR 5  DAYS BEFORE ISSUING A FINAL NEGATIVE REPORT Performed at [facility]    Report Status PENDING  Incomplete          Studies: Ct Chest W Contrast  03/01/2014   CLINICAL DATA:  69 year old male with acute fever of unknown origin. History of metastatic rectal  cancer.  EXAM: CT CHEST, ABDOMEN, AND PELVIS WITH CONTRAST  TECHNIQUE: Multidetector CT imaging of the chest, abdomen and pelvis was performed following the standard protocol during bolus administration of intravenous contrast.  CONTRAST:  54mL OMNIPAQUE IOHEXOL 300 MG/ML  SOLN  COMPARISON:  03/15/2013 CT  FINDINGS: CT CHEST FINDINGS  The heart is unremarkable except for moderate coronary artery calcifications.  There is no evidence of thoracic aortic aneurysm. No large/ central pulmonary emboli are identified.  Enlarging mediastinal lymph nodes are compatible with increasing metastatic disease, index nodes include the following:  A 1.3 cm right paratracheal node (image 17), previously 0.7 cm.  A 1.2 cm carinal node (image 29), previously 0.8 cm.  Tiny bilateral pleural effusions and mild bibasilar atelectasis noted.  Multiple new and enlarging bilateral pulmonary metastases identified with an index left apical metastasis now measuring 1 x 1.4 cm (image 13), previously 0.5 x 0.6 cm.  No acute bony abnormalities are identified.  CT ABDOMEN AND PELVIS FINDINGS  Multiple new and enlarging hepatic masses are identified compatible with progressive metastatic disease. Index left hepatic metastasis now measures 6.6 x 6.7 cm (image 51) previously measuring 2.4 x 3 cm.  Mild splenomegaly again noted.  Pancreas, gallbladder and adrenal glands are unremarkable.  New moderate right and moderate to severe left hydroureteronephrosis extending to a distended bladder is noted without definite obstructing cause.  Enlarging retroperitoneal lymph nodes are identified.  Mild thickening and irregularity of the rectum with noted.  There is no evidence of bowel  obstruction or pneumoperitoneum. No acute or suspicious bony abnormalities are identified.  IMPRESSION: Progressive metastatic disease manifested by new/enlarging diffuse pulmonary and hepatic metastases and enlarging mediastinal/retroperitoneal lymph nodes.  New moderate right and moderate to severe left hydroureteronephrosis extending to a distended bladder. No definite obstructing cause identified.  Mild rectal wall thickening and irregularity with adjacent stranding. Recurrent/residual tumor is not excluded.  Tiny bilateral pleural effusions and mild bibasilar atelectasis.   Electronically Signed   By: Margarette Canada M.D.   On: 03/01/2014 16:06   Ct Abdomen Pelvis W Contrast  03/01/2014   CLINICAL DATA:  69 year old male with acute fever of unknown origin. History of metastatic rectal cancer.  EXAM: CT CHEST, ABDOMEN, AND PELVIS WITH CONTRAST  TECHNIQUE: Multidetector CT imaging of the chest, abdomen and pelvis was performed following the standard protocol during bolus administration of intravenous contrast.  CONTRAST:  60mL OMNIPAQUE IOHEXOL 300 MG/ML  SOLN  COMPARISON:  03/15/2013 CT  FINDINGS: CT CHEST FINDINGS  The heart is unremarkable except for moderate coronary artery calcifications.  There is no evidence of thoracic aortic aneurysm. No large/ central pulmonary emboli are identified.  Enlarging mediastinal lymph nodes are compatible with increasing metastatic disease, index nodes include the following:  A 1.3 cm right paratracheal node (image 17), previously 0.7 cm.  A 1.2 cm carinal node (image 29), previously 0.8 cm.  Tiny bilateral pleural effusions and mild bibasilar atelectasis noted.  Multiple new and enlarging bilateral pulmonary metastases identified with an index left apical metastasis now measuring 1 x 1.4 cm (image 13), previously 0.5 x 0.6 cm.  No acute bony abnormalities are identified.  CT ABDOMEN AND PELVIS FINDINGS  Multiple new and enlarging hepatic masses are identified compatible with  progressive metastatic disease. Index left hepatic metastasis now measures 6.6 x 6.7 cm (image 51) previously measuring 2.4 x 3 cm.  Mild splenomegaly again noted.  Pancreas, gallbladder and adrenal glands are unremarkable.  New moderate right and moderate to severe  left hydroureteronephrosis extending to a distended bladder is noted without definite obstructing cause.  Enlarging retroperitoneal lymph nodes are identified.  Mild thickening and irregularity of the rectum with noted.  There is no evidence of bowel obstruction or pneumoperitoneum. No acute or suspicious bony abnormalities are identified.  IMPRESSION: Progressive metastatic disease manifested by new/enlarging diffuse pulmonary and hepatic metastases and enlarging mediastinal/retroperitoneal lymph nodes.  New moderate right and moderate to severe left hydroureteronephrosis extending to a distended bladder. No definite obstructing cause identified.  Mild rectal wall thickening and irregularity with adjacent stranding. Recurrent/residual tumor is not excluded.  Tiny bilateral pleural effusions and mild bibasilar atelectasis.   Electronically Signed   By: Margarette Canada M.D.   On: 03/01/2014 16:06        Scheduled Meds: . ALPRAZolam  0.25 mg Oral TID  . ceFEPime (MAXIPIME) IV  2 g Intravenous Q12H  . divalproex  125 mg Oral QHS  . enoxaparin (LOVENOX) injection  40 mg Subcutaneous Q24H  . feeding supplement (ENSURE COMPLETE)  237 mL Oral TID BM  . gabapentin  300 mg Oral TID  . loratadine  10 mg Oral Daily  . multivitamin with minerals  1 tablet Oral Daily  . saccharomyces boulardii  250 mg Oral q morning - 10a  . sodium chloride  3 mL Intravenous Q12H   Continuous Infusions: . 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 [facility] Pager [phone]  If 7PM-7AM, please contact night-coverage [email or web address] Password [email or web address] 03/02/2014, 1:00 PM    LOS: 5 days

## 2014-03-02 NOTE — Evaluation (Signed)
Clinical/Bedside Swallow Evaluation Patient Details  Name: Victor Little MRN: [id number] Date of Birth: [birth year]  Today's Date: 03/02/2014 Time: SLP Start Time (ACUTE ONLY): 1151 SLP Stop Time (ACUTE ONLY): 1231 SLP Time Calculation (min) (ACUTE ONLY): 40 min  Past Medical History:  Past Medical History  Diagnosis Date  . Mental retardation   . Organic brain syndrome   . Hypertension   . Deaf   . Clostridium difficile colitis   . Pneumonia   . Cellulitis   . Deaf   . Mutism   . Neuropathy   . Intra-abdominal abscess 12/19/2012  . Pneumoperitoneum of unknown etiology 12/19/2012  . Colon cancer 03/16/13    adenoma carcinomainvasive,mets to liver  . Anemia     iron deficiency  . Status post radiation therapy 04/17/13-04/23/13    pelvis 25Gy/5 fx   Past Surgical History:  Past Surgical History  Procedure Laterality Date  . Orif right patella  09/23/2005  . Removal of right patella hardware  01/26/2006  . Colonoscopy N/A 03/16/2013    Procedure: COLONOSCOPY;  Surgeon: Beryle Beams, MD;  Location: [facility];  Service: Endoscopy;  Laterality: N/A;   HPI:  is a 69 y.o. male with history of metastatic rectal cancer, with enlarged perirectal lymph node and liver metastasis, s/p palliative XRT and was on observation by Oncology, rectal bleeding secondary to cancer, deaf, mute, mental retardation, HTN, C. difficile, anemia sent from [facility] status post unwitnessed fall and generalized weakness. No history available from patient secondary to deaf/mute and mental retardation. Unable to reach patient's sister/health care power of attorney. History obtained from discussion with EDP. As per report, patient apparently fell this morning while trying to get off the toilet. No injuries were noted. Facility reported patient's mental status is at baseline. As per paramedics, patient was tachycardic. In the ED, noted to be hypotensive, tachycardic and hypoxic. Chest x-ray not suggestive  of pneumonia. In and out urinary catheterization revealed cloudy urine with sediments. Sepsis protocol was initiated and patient has thus far received 2.5 L of IV fluids, IV cefepime and vancomycin. Systolic blood pressures have improved in the low 90s. Urine microscopy suggestive of UTI.   Swallow evaluation ordered.   Assessment / Plan / Recommendation Clinical Impression  Pt is known to this therapist from previous admit in 12/2013.  Pt was placed on Dys 2 with nectar thick liquids at that time.  Currently, pt appeared to swallow thin liquids without difficulty, but then began coughing over the course of his lunch, and then consistently coughed when drinking thin liquids.  Pocketing of solids was noted on left.  Oral care provided, and pt continued to cough (very congested).  SLP suctioned mucous from oropharynx.  RN notified.    Aspiration Risk  Moderate    Diet Recommendation Dysphagia 3 (Mechanical Soft);Nectar-thick liquid   Liquid Administration via: Cup;No straw Medication Administration: Whole meds with puree Supervision: Staff to assist with self feeding;Full supervision/cueing for compensatory strategies Compensations: Slow rate;Small sips/bites;Check for pocketing Postural Changes and/or Swallow Maneuvers: Seated upright 90 degrees;Upright 30-60 min after meal    Other  Recommendations Oral Care Recommendations: Oral care Q4 per protocol;Staff/trained caregiver to provide oral care Other Recommendations: Order thickener from pharmacy;Clarify dietary restrictions;Have oral suction available   Follow Up Recommendations  Skilled Nursing facility [facility])    Frequency and Duration min 3x week  2 weeks   Pertinent Vitals/Pain Pt unable to state.        Swallow Study  Prior Functional Status       General HPI: is a 69 y.o. male with history of metastatic rectal cancer, with enlarged perirectal lymph node and liver metastasis, s/p palliative XRT and was on observation by  Oncology, rectal bleeding secondary to cancer, deaf, mute, mental retardation, HTN, C. difficile, anemia sent from [facility] status post unwitnessed fall and generalized weakness. No history available from patient secondary to deaf/mute and mental retardation. Unable to reach patient's sister/health care power of attorney. History obtained from discussion with EDP. As per report, patient apparently fell this morning while trying to get off the toilet. No injuries were noted. Facility reported patient's mental status is at baseline. As per paramedics, patient was tachycardic. In the ED, noted to be hypotensive, tachycardic and hypoxic. Chest x-ray not suggestive of pneumonia. In and out urinary catheterization revealed cloudy urine with sediments. Sepsis protocol was initiated and patient has thus far received 2.5 L of IV fluids, IV cefepime and vancomycin. Systolic blood pressures have improved in the low 90s. Urine microscopy suggestive of UTI.   Swallow evaluation ordered. Type of Study: Bedside swallow evaluation Previous Swallow Assessment: Pt was seen by this SLP in December of 2015, and placed on a Dysphagia 2 diet with nectar thick liquids.   Diet Prior to this Study: Regular;Thin liquids Temperature Spikes Noted: Yes Respiratory Status: Room air History of Recent Intubation: No Behavior/Cognition: Alert;Cooperative;Requires cueing;Hard of hearing;Impulsive (deaf) Oral Cavity - Dentition: Adequate natural dentition;Poor condition Self-Feeding Abilities: Able to feed self Patient Positioning: Upright in bed Volitional Cough: Cognitively unable to elicit Volitional Swallow: Unable to elicit    Oral/Motor/Sensory Function Overall Oral Motor/Sensory Function: Appears within functional limits for tasks assessed   Ice Chips Ice chips: Not tested   Thin Liquid Thin Liquid: Impaired Presentation: Spoon;Cup;Self Fed Pharyngeal  Phase Impairments: Suspected delayed Swallow;Decreased  hyoid-laryngeal movement;Cough - Immediate    Nectar Thick Nectar Thick Liquid: Within functional limits Presentation: Cup   Honey Thick Honey Thick Liquid: Not tested   Puree Puree: Within functional limits   Solid   GO    Solid: Impaired Presentation: Spoon Oral Phase Impairments: Reduced lingual movement/coordination;Impaired anterior to posterior transit;Impaired mastication Oral Phase Functional Implications: Left lateral sulci pocketing;Oral holding;Oral residue Pharyngeal Phase Impairments: Suspected delayed Swallow;Cough - Immediate       Quinn Axe T 03/02/2014,12:32 PM

## 2014-03-02 NOTE — Progress Notes (Signed)
PT Cancellation Note  Patient Details Name: Victor Little MRN: [id number] DOB: December 23, 1945   Cancelled Treatment:    Reason Eval/Treat Not Completed: Other (comment) (note that MD wants to discuss Palliative care with family.)   Victor Little 03/02/2014, 12:39 PM Tresa Endo PT [phone]

## 2014-03-02 NOTE — Progress Notes (Signed)
Victor Little   DOB:July 31, 1945   NT#:[id number]   GQQ#:[id number]  Patient Care Team: [name], MD as PCP - General (Internal Medicine) Ladell Pier, MD as Consulting Physician (Oncology)  Subjective: [name] is a 69 year old male with a history of metastatic rectal cancer with enlarged perirectal lymph node and liver metastasis, status post palliative radiation, who was currently on observation.The patient was admitted on 02/25/2014 from SNF after suffering an unwitnessed fall with generalized weakness. No apparent injuries were noted. The facility reported that the patient will assess his baseline mental status. At the emergency Department, the patient was hypotensive, tachycardic and hypoxic. Chest x-ray was not suggestive of pneumonia. Urine was positive for nitrites,thus, sepsis protocol was initiated. The patient received IV fluids, IV cefepime and vancomycin. Cultures have returned positive for Escherichia coli UTI requiring IV antibiotics.   As part of the workup, an x-ray was performed, which revealed lung nodules in the right apex and right base. Follow-up CT of the chest abdomen and pelvis with contrast On 03/01/2014 showed Progressive metastatic disease, as evidenced by new enlarging diffuse pulmonary and hepatic metastases, and enlarging mediastinal-retroperitoneal lymph nodes. That is a new moderate right and moderate to severe left hydroureteronephrosis extending to a distended bladder, but no obstructing cause is identified. There is mild rectal wall thickening and irregularity with adjacent stranding seen, question her current versus residual tumor  No acute bony abnormalities were noted. Based on these findings, we were asked to see the patient in consultation, with recommendations, prior to his discharge once clinically stable. Palliative care consultation is pending  Brief oncologic history  1. Rectal cancer-metastatic, colonoscopy 03/16/2013 confirmed a rectal mass at 4 cm  from the anal verge with a biopsy confirming adenocarcinoma .CT scan 03/14/2013 consistent with an enlarged perirectal lymph node and a liver metastasis  2. History of rectal bleeding secondary to #1   Status post palliative radiation to the rectum completed 04/23/2013 3. Deaf/mute  4. Mental retardation  5. mild swelling of the left lower leg-asymptomatic, new dopplers negative for DVT  Scheduled Meds: . ALPRAZolam  0.25 mg Oral TID  . ceFEPime (MAXIPIME) IV  2 g Intravenous Q12H  . divalproex  125 mg Oral QHS  . enoxaparin (LOVENOX) injection  40 mg Subcutaneous Q24H  . feeding supplement (ENSURE COMPLETE)  237 mL Oral TID BM  . gabapentin  300 mg Oral TID  . loratadine  10 mg Oral Daily  . multivitamin with minerals  1 tablet Oral Daily  . saccharomyces boulardii  250 mg Oral q morning - 10a  . sodium chloride  3 mL Intravenous Q12H   Continuous Infusions: . sodium chloride 125 mL/hr at 03/02/14 0551   PRN Meds:acetaminophen **OR** acetaminophen, albuterol, ondansetron **OR** ondansetron (ZOFRAN) IV, traMADol   Objective:  Filed Vitals:   03/02/14 0552  BP:   Pulse:   Temp: 101 F (38.3 C)  Resp:       Intake/Output Summary (Last 24 hours) at 03/02/14 1148 Last data filed at 03/02/14 0919  Gross per 24 hour  Intake 2315.75 ml  Output   4976 ml  Net -2660.25 ml    ECOG PERFORMANCE STATUS:  GENERAL:alert, no distress and comfortable SKIN: skin color, texture, turgor are normal, no rashes or significant lesions OROPHARYNX:no exudate, no erythema and lips, buccal mucosa, and tongue normal  LYMPH:  no palpable lymphadenopathy in the cervical, axillary or inguinal  LUNGS: clear anteriorly, no respiratory distress, intermittent cough HEART: regular rate & rhythm and  no murmurs  ABDOMEN: soft, non-tender , no hepatomegaly, no mass Musculoskeletal:no cyanosis of digits and no clubbing  NEURO: Alert, minimal communication with the sign language  interpreter     Labs:   Recent Labs Lab 02/25/14 1012 02/26/14 0400 02/27/14 0359 02/28/14 0500 03/01/14 0444 03/02/14 0447  WBC 14.9* 14.1* 13.4* 12.2* 21.8* 17.5*  HGB 10.7* 9.9* 9.0* 8.2* 9.1* 8.3*  HCT 35.3* 32.5* 29.8* 26.2* 29.4* 27.5*  PLT 488* 377 342 316 320 300  MCV 76.6* 76.5* 76.6* 75.7* 76.4* 75.3*  MCH 23.2* 23.3* 23.1* 23.7* 23.6* 22.7*  MCHC 30.3 30.5 30.2 31.3 31.0 30.2  RDW 17.6* 17.7* 17.8* 17.9* 18.8* 18.7*  LYMPHSABS 1.4  --   --   --   --   --   MONOABS 1.1*  --   --   --   --   --   EOSABS 0.1  --   --   --   --   --   BASOSABS 0.0  --   --   --   --   --      Chemistries:    Recent Labs Lab 02/26/14 0400 02/27/14 0359 02/28/14 0500 03/01/14 0444 03/02/14 0447  NA 146* 142 139 144 138  K 3.8 3.7 4.3 5.0 4.4  CL 115* 112 110 114* 108  CO2 $Re'21 21 22 22 22  'mil$ GLUCOSE 99 90 100* 108* 107*  BUN 43* 35* 32* 34* 31*  CREATININE 1.57* 1.36* 1.29 1.48* 1.34  CALCIUM 7.6* 7.3* 7.4* 7.8* 7.4*  AST 59* 75* 112* 94* 73*  ALT 33 38 56* 56* 45  ALKPHOS 379* 406* 443* 421* 334*  BILITOT 0.9 0.8 0.7 0.7 0.7    GFR Estimated Creatinine Clearance: 56 mL/min (by C-G formula based on Cr of 1.34).  Liver Function Tests:  Recent Labs Lab 02/26/14 0400 02/27/14 0359 02/28/14 0500 03/01/14 0444 03/02/14 0447  AST 59* 75* 112* 94* 73*  ALT 33 38 56* 56* 45  ALKPHOS 379* 406* 443* 421* 334*  BILITOT 0.9 0.8 0.7 0.7 0.7  PROT 7.0 6.1 6.0 6.3 6.2  ALBUMIN 2.4* 2.1* 1.8* 2.0* 1.8*   No results for input(s): LIPASE, AMYLASE in the last 168 hours. No results for input(s): AMMONIA in the last 168 hours.  Urine Studies     Component Value Date/Time   COLORURINE AMBER* 02/25/2014 1130   APPEARANCEUR TURBID* 02/25/2014 1130   LABSPEC 1.018 02/25/2014 1130   PHURINE 5.0 02/25/2014 1130   GLUCOSEU NEGATIVE 02/25/2014 1130   HGBUR MODERATE* 02/25/2014 1130   BILIRUBINUR SMALL* 02/25/2014 1130   KETONESUR NEGATIVE 02/25/2014 1130   PROTEINUR 30*  02/25/2014 1130   UROBILINOGEN 1.0 02/25/2014 1130   NITRITE POSITIVE* 02/25/2014 1130   LEUKOCYTESUR LARGE* 02/25/2014 1130     Recent Labs  03/02/14 0740  TSH 1.887   Microbiology E Coli in urine Blood cultures pending    Imaging Studies:  Ct Chest W Contrast  03/01/2014   CLINICAL DATA:  69 year old male with acute fever of unknown origin. History of metastatic rectal cancer.  EXAM: CT CHEST, ABDOMEN, AND PELVIS WITH CONTRAST  TECHNIQUE: Multidetector CT imaging of the chest, abdomen and pelvis was performed following the standard protocol during bolus administration of intravenous contrast.  CONTRAST:  62mL OMNIPAQUE IOHEXOL 300 MG/ML  SOLN  COMPARISON:  03/15/2013 CT  FINDINGS: CT CHEST FINDINGS  The heart is unremarkable except for moderate coronary artery calcifications.  There is no evidence of thoracic aortic aneurysm. No  large/ central pulmonary emboli are identified.  Enlarging mediastinal lymph nodes are compatible with increasing metastatic disease, index nodes include the following:  A 1.3 cm right paratracheal node (image 17), previously 0.7 cm.  A 1.2 cm carinal node (image 29), previously 0.8 cm.  Tiny bilateral pleural effusions and mild bibasilar atelectasis noted.  Multiple new and enlarging bilateral pulmonary metastases identified with an index left apical metastasis now measuring 1 x 1.4 cm (image 13), previously 0.5 x 0.6 cm.  No acute bony abnormalities are identified.  CT ABDOMEN AND PELVIS FINDINGS  Multiple new and enlarging hepatic masses are identified compatible with progressive metastatic disease. Index left hepatic metastasis now measures 6.6 x 6.7 cm (image 51) previously measuring 2.4 x 3 cm.  Mild splenomegaly again noted.  Pancreas, gallbladder and adrenal glands are unremarkable.  New moderate right and moderate to severe left hydroureteronephrosis extending to a distended bladder is noted without definite obstructing cause.  Enlarging retroperitoneal lymph  nodes are identified.  Mild thickening and irregularity of the rectum with noted.  There is no evidence of bowel obstruction or pneumoperitoneum. No acute or suspicious bony abnormalities are identified.  IMPRESSION: Progressive metastatic disease manifested by new/enlarging diffuse pulmonary and hepatic metastases and enlarging mediastinal/retroperitoneal lymph nodes.  New moderate right and moderate to severe left hydroureteronephrosis extending to a distended bladder. No definite obstructing cause identified.  Mild rectal wall thickening and irregularity with adjacent stranding. Recurrent/residual tumor is not excluded.  Tiny bilateral pleural effusions and mild bibasilar atelectasis.   Electronically Signed   By: Margarette Canada M.D.   On: 03/01/2014 16:06   Ct Abdomen Pelvis W Contrast  03/01/2014   CLINICAL DATA:  69 year old male with acute fever of unknown origin. History of metastatic rectal cancer.  EXAM: CT CHEST, ABDOMEN, AND PELVIS WITH CONTRAST  TECHNIQUE: Multidetector CT imaging of the chest, abdomen and pelvis was performed following the standard protocol during bolus administration of intravenous contrast.  CONTRAST:  47mL OMNIPAQUE IOHEXOL 300 MG/ML  SOLN  COMPARISON:  03/15/2013 CT  FINDINGS: CT CHEST FINDINGS  The heart is unremarkable except for moderate coronary artery calcifications.  There is no evidence of thoracic aortic aneurysm. No large/ central pulmonary emboli are identified.  Enlarging mediastinal lymph nodes are compatible with increasing metastatic disease, index nodes include the following:  A 1.3 cm right paratracheal node (image 17), previously 0.7 cm.  A 1.2 cm carinal node (image 29), previously 0.8 cm.  Tiny bilateral pleural effusions and mild bibasilar atelectasis noted.  Multiple new and enlarging bilateral pulmonary metastases identified with an index left apical metastasis now measuring 1 x 1.4 cm (image 13), previously 0.5 x 0.6 cm.  No acute bony abnormalities are  identified.  CT ABDOMEN AND PELVIS FINDINGS  Multiple new and enlarging hepatic masses are identified compatible with progressive metastatic disease. Index left hepatic metastasis now measures 6.6 x 6.7 cm (image 51) previously measuring 2.4 x 3 cm.  Mild splenomegaly again noted.  Pancreas, gallbladder and adrenal glands are unremarkable.  New moderate right and moderate to severe left hydroureteronephrosis extending to a distended bladder is noted without definite obstructing cause.  Enlarging retroperitoneal lymph nodes are identified.  Mild thickening and irregularity of the rectum with noted.  There is no evidence of bowel obstruction or pneumoperitoneum. No acute or suspicious bony abnormalities are identified.  IMPRESSION: Progressive metastatic disease manifested by new/enlarging diffuse pulmonary and hepatic metastases and enlarging mediastinal/retroperitoneal lymph nodes.  New moderate right and moderate to severe  left hydroureteronephrosis extending to a distended bladder. No definite obstructing cause identified.  Mild rectal wall thickening and irregularity with adjacent stranding. Recurrent/residual tumor is not excluded.  Tiny bilateral pleural effusions and mild bibasilar atelectasis.   Electronically Signed   By: Margarette Canada M.D.   On: 03/01/2014 16:06    Assessment/Plan: 69 y.o.   1. Rectal cancer-metastatic  This was diagnosed in March 2015 per colonoscopy with biopsies The patient is status post palliative radiation to the rectum, completed in 04/23/2013 He was on observation until now. New CT of the chest abdomen and pelvis during this admission revealed progressive metastatic disease as evidence by new-enlarging diffuse pulmonary and hepatic metastases, enlarging mediastinal and retroperitoneal lymph nodes, new moderate right and moderate to severe left hydroureteronephrosis extending to bladder, without obstructing cause identified. Mild rectal wall thickening and irregularity with  adjacent stranding is seen. Hospice care is appropriate Likely discharge to [facility]  2. 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 Leukocytosis-secondary to infection and metastatic carcinoma     7. Malnutrition   8.  Deaf/mute/mental retardation   9.  Bilateral hydronephrosis-likely secondary to tumor obstructing the ureters versus bladder outlet obstruction   **Disclaimer: This note was dictated with voice recognition software. Similar sounding words can inadvertently be transcribed and this note may contain transcription errors which may not have been corrected upon publication of note.Sharene Butters E, PA-C 03/02/2014  11:48 AM  He was diagnosed with metastatic rectal cancer last year. He completed treatment with palliative radiation to the rectum.  Mr. Grandstaff did not return for scheduled follow-up at the [facility].  He was admitted 02/25/2014 with sepsis syndrome secondary to a urinary tract infection. He has persistent fevers/tachycardia and appears ill. He may have a persistent source of infection or "tumor fever ".   Mr. Wojcicki appears to have limited understanding of his diagnosis and the current situation. I met with him via a sign language interpreter today. He had minimal communication with the interpreter. He was able to confirm that he has no pain.  I recommend comfort care. He appears to be a candidate for Hospice and [facility] [facility]. I discussed the situation with his sister by telephone. She indicated that she is the power of attorney. She agrees to Hospice care.  Recommendations:  1. [place] hosp[facility] referral to consider  transfer to [facility] 2. Comfort care measures 3. Antibiotics per the discretion of the infectious disease service   Please call Oncology as needed. I will check on him over the next few days and I will be glad to follow him with Hospice as an outpatient.

## 2014-03-02 NOTE — Progress Notes (Signed)
[place] for Infectious Disease    Date of Admission:  02/25/2014   Total days of antibiotics 6        Day 6 cefepime           ID: Victor Little is a 69 y.o. male with .History of metastatic rectal cancer, with enlarged perirectal lymph node and liver metastasis, s/p palliative XRT and was on observation by Oncology, rectal bleeding secondary to cancer, deaf, mute, mental retardation,sent from [facility] status post unwitnessed fall and generalized weakness. 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 tmax of 101.49F last night. He has had repeat blood cx < 48hrs  Medications:  . ALPRAZolam  0.25 mg Oral TID  . ceFEPime (MAXIPIME) IV  2 g Intravenous Q12H  . divalproex  125 mg Oral QHS  . enoxaparin (LOVENOX) injection  40 mg Subcutaneous Q24H  . feeding supplement (ENSURE COMPLETE)  237 mL Oral TID BM  . gabapentin  300 mg Oral TID  . loratadine  10 mg Oral Daily  . multivitamin with minerals  1 tablet Oral Daily  . saccharomyces boulardii  250 mg Oral q morning - 10a  . sodium chloride  3 mL Intravenous Q12H    Objective: Vital signs in last 24 hours: Temp:  [97.5 F (36.4 C)-101.7 F (38.7 C)] 101 F (38.3 C) (02/15 0552) Pulse Rate:  [126-143] 143 (02/15 0438) Resp:  [20] 20 (02/15 0438) BP: (92-129)/(48-74) 129/64 mmHg (02/15 0438) SpO2:  [98 %] 98 % (02/15 0438)  Physical Exam  Constitutional:He is sleeping. No distress.  HENT: poor dentition, dry oral  mucosa Mouth/Throat: Oropharynx is clear. No oropharyngeal exudate.  Cardiovascular: Normal rate, regular rhythm and normal heart sounds. Exam reveals no gallop and no friction rub.  No murmur heard.  Pulmonary/Chest: Effort normal and breath sounds normal. No respiratory distress. He has no wheezes.  Abdominal: Soft. Bowel sounds are decreased. He exhibits no distension. There is no tenderness.   Lab Results  Recent Labs  03/01/14 0444 03/02/14 0447  WBC 21.8* 17.5*  HGB 9.1* 8.3*  HCT 29.4* 27.5*  NA 144 138  K 5.0 4.4  CL 114* 108  CO2 22 22  BUN 34* 31*  CREATININE 1.48* 1.34   Liver Panel  Recent Labs  03/01/14 0444 03/02/14 0447  PROT 6.3 6.2  ALBUMIN 2.0* 1.8*  AST 94* 73*  ALT 56* 45  ALKPHOS 421* 334*  BILITOT 0.7 0.7   Sedimentation Rate No results for input(s): ESRSEDRATE in the last 72 hours. C-Reactive Protein No results for input(s): CRP in the last 72 hours.  Microbiology: 2/13 blood cx ngtd 2/10 urine cx ecoli 2/10 blood cx ngtd Studies/Results: Ct Chest W Contrast  03/01/2014   CLINICAL DATA:  69 year old male with acute fever of unknown origin. History of metastatic rectal cancer.  EXAM: CT CHEST, ABDOMEN, AND PELVIS WITH CONTRAST  TECHNIQUE: Multidetector CT imaging of the chest, abdomen and  pelvis was performed following the standard protocol during bolus administration of intravenous contrast.  CONTRAST:  98mL OMNIPAQUE IOHEXOL 300 MG/ML  SOLN  COMPARISON:  03/15/2013 CT  FINDINGS: CT CHEST FINDINGS  The heart is unremarkable except for moderate coronary artery calcifications.  There is no evidence of thoracic aortic aneurysm. No large/ central pulmonary emboli are identified.  Enlarging mediastinal lymph nodes are compatible with increasing metastatic disease, index nodes include the following:  A 1.3 cm right paratracheal node (image 17), previously 0.7 cm.  A 1.2 cm carinal node (image 29), previously 0.8 cm.  Tiny bilateral pleural effusions and  mild bibasilar atelectasis noted.  Multiple new and enlarging bilateral pulmonary metastases identified with an index left apical metastasis now measuring 1 x 1.4 cm (image 13), previously 0.5 x 0.6 cm.  No acute bony abnormalities are identified.  CT ABDOMEN AND PELVIS FINDINGS  Multiple new and enlarging hepatic masses are identified compatible with progressive metastatic disease. Index left hepatic metastasis now measures 6.6 x 6.7 cm (image 51) previously measuring 2.4 x 3 cm.  Mild splenomegaly again noted.  Pancreas, gallbladder and adrenal glands are unremarkable.  New moderate right and moderate to severe left hydroureteronephrosis extending to a distended bladder is noted without definite obstructing cause.  Enlarging retroperitoneal lymph nodes are identified.  Mild thickening and irregularity of the rectum with noted.  There is no evidence of bowel obstruction or pneumoperitoneum. No acute or suspicious bony abnormalities are identified.  IMPRESSION: Progressive metastatic disease manifested by new/enlarging diffuse pulmonary and hepatic metastases and enlarging mediastinal/retroperitoneal lymph nodes.  New moderate right and moderate to severe left hydroureteronephrosis extending to a distended bladder. No definite obstructing cause identified.  Mild rectal wall thickening and irregularity with adjacent stranding. Recurrent/residual tumor is not excluded.  Tiny bilateral pleural effusions and mild bibasilar atelectasis.   Electronically Signed   By: Margarette Canada M.D.   On: 03/01/2014 16:06   Ct Abdomen Pelvis W Contrast  03/01/2014   CLINICAL DATA:  69 year old male with acute fever of unknown origin. History of metastatic rectal cancer.  EXAM: CT CHEST, ABDOMEN, AND PELVIS WITH CONTRAST  TECHNIQUE: Multidetector CT imaging of the chest, abdomen and pelvis was performed following the standard protocol during bolus administration of intravenous contrast.  CONTRAST:  27mL OMNIPAQUE IOHEXOL 300 MG/ML   SOLN  COMPARISON:  03/15/2013 CT  FINDINGS: CT CHEST FINDINGS  The heart is unremarkable except for moderate coronary artery calcifications.  There is no evidence of thoracic aortic aneurysm. No large/ central pulmonary emboli are identified.  Enlarging mediastinal lymph nodes are compatible with increasing metastatic disease, index nodes include the following:  A 1.3 cm right paratracheal node (image 17), previously 0.7 cm.  A 1.2 cm carinal node (image 29), previously 0.8 cm.  Tiny bilateral pleural effusions and mild bibasilar atelectasis noted.  Multiple new and enlarging bilateral pulmonary metastases identified with an index left apical metastasis now measuring 1 x 1.4 cm (image 13), previously 0.5 x 0.6 cm.  No acute bony abnormalities are identified.  CT ABDOMEN AND PELVIS FINDINGS  Multiple new and enlarging hepatic masses are identified compatible with progressive metastatic disease. Index left hepatic metastasis now measures 6.6 x 6.7 cm (image 51) previously measuring 2.4 x 3 cm.  Mild splenomegaly again noted.  Pancreas, gallbladder and adrenal glands are unremarkable.  New moderate right and moderate to severe left hydroureteronephrosis extending to a distended bladder is noted without definite obstructing cause.  Enlarging retroperitoneal lymph nodes are identified.  Mild thickening and irregularity of the rectum with noted.  There is no evidence of bowel obstruction or pneumoperitoneum. No acute or suspicious bony abnormalities are identified.  IMPRESSION: Progressive metastatic disease manifested by new/enlarging diffuse pulmonary and hepatic metastases and enlarging mediastinal/retroperitoneal lymph nodes.  New moderate right and moderate to severe left hydroureteronephrosis extending to a distended bladder. No definite obstructing cause identified.  Mild rectal wall thickening and irregularity with adjacent stranding. Recurrent/residual tumor is not excluded.  Tiny bilateral pleural effusions and  mild bibasilar atelectasis.   Electronically Signed   By: Margarette Canada M.D.   On: 03/01/2014 16:06     Assessment/Plan: Fevers in 69yo M with progressive metastatic rectal cancer with enlarging pulmonary, and hepatic metastases  Fevers = possibly due to underlying malignancy. Will continue on ceftriaxone, await blood cx to see if any new pathogen is noted. CT suggests rectal wall stranding. Recommend oral metronidazole 500mg  IV TID to treat anaerobes  ecoli uti = currently his sensitivities would suggest that cefepime should work on his ecoli isolate. Will narrow to ceftriaxone for now  aki = improved  Victor Little, [facility] for Infectious Diseases Cell: [phone] Pager: [phone]  03/02/2014, 12:29 PM

## 2014-03-03 ENCOUNTER — Telehealth: Payer: Self-pay | Admitting: *Deleted

## 2014-03-03 DIAGNOSIS — A047 Enterocolitis due to Clostridium difficile: Secondary | ICD-10-CM

## 2014-03-03 LAB — CLOSTRIDIUM DIFFICILE BY PCR: Toxigenic C. Difficile by PCR: POSITIVE — AB

## 2014-03-03 LAB — CULTURE, BLOOD (ROUTINE X 2)
Culture: NO GROWTH
Culture: NO GROWTH

## 2014-03-03 LAB — BASIC METABOLIC PANEL
Anion gap: 6 (ref 5–15)
BUN: 28 mg/dL — AB (ref 6–23)
CALCIUM: 7.3 mg/dL — AB (ref 8.4–10.5)
CHLORIDE: 114 mmol/L — AB (ref 96–112)
CO2: 23 mmol/L (ref 19–32)
Creatinine, Ser: 1.23 mg/dL (ref 0.50–1.35)
GFR calc non Af Amer: 59 mL/min — ABNORMAL LOW (ref >90)
GFR, EST AFRICAN AMERICAN: 68 mL/min — AB (ref >90)
Glucose, Bld: 114 mg/dL — ABNORMAL HIGH (ref 70–99)
Potassium: 4.1 mmol/L (ref 3.5–5.1)
Sodium: 143 mmol/L (ref 135–145)

## 2014-03-03 LAB — CBC
HEMATOCRIT: 23.2 % — AB (ref 39.0–52.0)
HEMOGLOBIN: 7.3 g/dL — AB (ref 13.0–17.0)
MCH: 23.9 pg — ABNORMAL LOW (ref 26.0–34.0)
MCHC: 31.5 g/dL (ref 30.0–36.0)
MCV: 76.1 fL — ABNORMAL LOW (ref 78.0–100.0)
Platelets: 263 10*3/uL (ref 150–400)
RBC: 3.05 MIL/uL — AB (ref 4.22–5.81)
RDW: 19 % — ABNORMAL HIGH (ref 11.5–15.5)
WBC: 11.9 10*3/uL — AB (ref 4.0–10.5)

## 2014-03-03 MED ORDER — MORPHINE SULFATE (CONCENTRATE) 10 MG/0.5ML PO SOLN: 5.0000 mg | ORAL | Status: DC | PRN

## 2014-03-03 MED ORDER — ALPRAZOLAM 0.25 MG PO TABS: 0.2500 mg | ORAL_TABLET | Freq: Three times a day (TID) | ORAL | Status: AC

## 2014-03-03 MED ORDER — ACETAMINOPHEN 325 MG PO TABS: 650.0000 mg | ORAL_TABLET | Freq: Four times a day (QID) | ORAL | Status: DC | PRN

## 2014-03-03 MED ORDER — VANCOMYCIN 50 MG/ML ORAL SOLUTION: 125.0000 mg | Freq: Four times a day (QID) | ORAL | Status: AC

## 2014-03-03 MED ORDER — ACETAMINOPHEN 325 MG PO TABS: 650.0000 mg | ORAL_TABLET | Freq: Four times a day (QID) | ORAL | Status: AC | PRN

## 2014-03-03 MED ORDER — ENSURE COMPLETE PO LIQD: 237.0000 mL | Freq: Three times a day (TID) | ORAL | Status: AC

## 2014-03-03 MED ORDER — MORPHINE SULFATE (CONCENTRATE) 10 MG/0.5ML PO SOLN: 5.0000 mg | ORAL | Status: AC | PRN

## 2014-03-03 MED ORDER — VANCOMYCIN 50 MG/ML ORAL SOLUTION
125.0000 mg | Freq: Four times a day (QID) | ORAL | Status: DC
  Administered 2014-03-03 – 2014-03-04 (×3): 125 mg via ORAL
  Filled 2014-03-03 (×9): qty 2.5

## 2014-03-03 NOTE — Progress Notes (Signed)
[facility]    Date of Admission:  02/25/2014   Total days of antibiotics 7           ID: Victor Little is a 69 y.o. 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 ALPRAZolam  0.25 mg Oral TID  . divalproex  125 mg Oral QHS  . enoxaparin (LOVENOX) injection  40 mg Subcutaneous Q24H  . feeding supplement (ENSURE COMPLETE)  237 mL Oral TID BM  . gabapentin  300 mg Oral TID  . loratadine  10 mg Oral Daily  . multivitamin with minerals  1 tablet Oral Daily  . sodium chloride  3 mL Intravenous Q12H  . vancomycin  125 mg Oral 4 times per day    Objective: Vital signs in last 24 hours: Temp:  [98.4 F (36.9 C)-100.6 F (38.1 C)] 98.6 F (37 C) (02/16 0545) Pulse Rate:  [115-133] 115 (02/16 0545) Resp:  [16-20] 16 (02/16 0545) BP: (103-114)/(61-70) 103/61 mmHg (02/16 0545) SpO2:  [98 %-99 %] 99 % (02/16 0545) gen = sleeping comfortably  Lab Results  Recent Labs  03/02/14 0447 03/03/14 0415  WBC 17.5* 11.9*  HGB 8.3* 7.3*  HCT 27.5* 23.2*  NA 138 143  K 4.4 4.1  CL 108 114*  CO2 22 23  BUN 31* 28*  CREATININE 1.34 1.23   Liver Panel  Recent Labs  03/01/14 0444 03/02/14 0447  PROT 6.3 6.2  ALBUMIN 2.0* 1.8*  AST 94* 73*  ALT 56* 45  ALKPHOS 421* 334*  BILITOT 0.7 0.7   Sedimentation Rate No results for input(s): ESRSEDRATE in the last 72 hours. C-Reactive Protein No  results for input(s): CRP in the last 72 hours.  Microbiology: 2/16 c.difficile positive Studies/Results: Ct Chest W Contrast  03/01/2014   CLINICAL DATA:  69 year old male with acute fever of unknown origin. History of metastatic rectal cancer.  EXAM: CT CHEST, ABDOMEN, AND PELVIS WITH CONTRAST  TECHNIQUE: Multidetector CT imaging of the chest, abdomen and pelvis was performed following the standard protocol during bolus administration of intravenous contrast.  CONTRAST:  75mL OMNIPAQUE IOHEXOL 300 MG/ML  SOLN  COMPARISON:  03/15/2013 CT  FINDINGS: CT CHEST FINDINGS  The heart is unremarkable except for moderate coronary artery calcifications.  There is no evidence of thoracic aortic aneurysm. No large/ central pulmonary emboli are identified.  Enlarging mediastinal lymph nodes are compatible with increasing metastatic disease, index nodes include the following:  A 1.3 cm right paratracheal node (image 17), previously 0.7 cm.  A 1.2 cm carinal node (image 29), previously 0.8 cm.  Tiny bilateral pleural effusions and mild bibasilar atelectasis noted.  Multiple new and enlarging bilateral pulmonary metastases identified with an index left apical metastasis now measuring 1 x 1.4 cm (image 13), previously 0.5 x 0.6 cm.  No acute bony abnormalities are identified.  CT ABDOMEN AND PELVIS FINDINGS  Multiple new and enlarging hepatic masses are identified compatible with progressive metastatic disease. Index left hepatic metastasis now measures 6.6 x 6.7 cm (image 51) previously measuring 2.4 x 3 cm.  Mild splenomegaly again noted.  Pancreas, gallbladder and adrenal glands are unremarkable.  New moderate right and moderate to severe left hydroureteronephrosis extending to a distended bladder is noted without definite obstructing cause.  Enlarging retroperitoneal lymph nodes are identified.  Mild thickening and irregularity of the rectum with noted.  There is no evidence of bowel obstruction or pneumoperitoneum. No  acute or suspicious bony abnormalities are identified.  IMPRESSION: Progressive metastatic disease manifested by new/enlarging diffuse pulmonary and hepatic metastases and enlarging mediastinal/retroperitoneal lymph nodes.  New moderate right and moderate to severe left hydroureteronephrosis extending to a distended bladder. No definite obstructing cause identified.  Mild rectal wall thickening and irregularity with adjacent stranding. Recurrent/residual tumor is not excluded.  Tiny bilateral pleural effusions and mild bibasilar atelectasis.   Electronically Signed   By: Margarette Canada M.D.   On: 03/01/2014 16:06   Ct Abdomen Pelvis W Contrast  03/01/2014   CLINICAL DATA:  69 year old male with acute fever of unknown origin. History of metastatic rectal cancer.  EXAM: CT CHEST, ABDOMEN, AND PELVIS WITH CONTRAST  TECHNIQUE: Multidetector CT imaging of the chest, abdomen and pelvis was performed following the standard protocol during bolus administration of intravenous contrast.  CONTRAST:  36mL OMNIPAQUE IOHEXOL 300 MG/ML  SOLN  COMPARISON:  03/15/2013 CT  FINDINGS: CT CHEST FINDINGS  The heart is unremarkable except for moderate coronary artery calcifications.  There is no evidence of thoracic aortic aneurysm. No large/ central pulmonary emboli are identified.  Enlarging mediastinal lymph nodes are compatible with increasing metastatic disease, index nodes include the following:  A 1.3 cm right paratracheal node (image 17), previously 0.7 cm.  A 1.2 cm carinal node (image 29), previously 0.8 cm.  Tiny bilateral pleural effusions and mild bibasilar atelectasis noted.  Multiple new and enlarging bilateral pulmonary metastases identified with an index left apical metastasis now measuring 1 x 1.4 cm (image 13), previously 0.5 x 0.6 cm.  No acute bony abnormalities are identified.  CT ABDOMEN AND PELVIS FINDINGS  Multiple new and enlarging hepatic masses are identified compatible with progressive metastatic disease.  Index left hepatic metastasis now measures 6.6 x 6.7 cm (image 51) previously measuring 2.4 x 3 cm.  Mild splenomegaly again noted.  Pancreas, gallbladder and adrenal glands are unremarkable.  New moderate right and moderate to severe left hydroureteronephrosis extending to a distended bladder is noted without definite obstructing cause.  Enlarging retroperitoneal lymph nodes are identified.  Mild thickening and irregularity of the rectum with noted.  There is no evidence of bowel obstruction or pneumoperitoneum. No acute or suspicious bony abnormalities are identified.  IMPRESSION: Progressive metastatic disease manifested by new/enlarging diffuse pulmonary and hepatic metastases and enlarging mediastinal/retroperitoneal lymph nodes.  New moderate right and moderate to severe left hydroureteronephrosis extending to a distended bladder. No definite obstructing cause identified.  Mild rectal wall thickening and irregularity with adjacent stranding. Recurrent/residual tumor is not excluded.  Tiny bilateral pleural effusions and mild bibasilar atelectasis.   Electronically Signed   By: Margarette Canada M.D.   On: 03/01/2014 16:06     Assessment/Plan: Clostridium difficile = will give oral vancomycin 125mg  QID x 10 days. Discontinue probiotics for risk of disseminated lactobacillus in immunocompromised hosts. Will discontinue other IV antibiotics  Complicated ecoli uti = has finished  7 day course of antibiotics today  Fevers= curve trending down, continue to treat symptomatically  Agree with transition to hospice care. Will sign off.  Baxter Flattery [facility] Cell: [phone] Pager: [phone]  03/03/2014, 1:56 PM

## 2014-03-03 NOTE — Progress Notes (Signed)
Spoke with patient's sister J.[name] with Dr. Algis Liming. Sister stated she wished for patient to be DNR.Harlene Ramus

## 2014-03-03 NOTE — Progress Notes (Signed)
Physical Therapy Discharge Patient Details Name: Victor Little MRN: [id number] DOB: 1945/10/20 Today's Date: 03/03/2014 Time:  -     Patient discharged from PT services secondary to medical status- for Hospice   Please see latest therapy progress note for current level of functioning and progress toward goals.        Marcelino Freestone PT [phone]  03/03/2014, 7:46 AM

## 2014-03-03 NOTE — Telephone Encounter (Signed)
WILL DR. SHERRILL BE THE PT.'S ATTENDING PHYSICIAN? VERBAL ORDER AND READ BACK TO LISA THOMAS,NP- DR.SHERRILL WILL BE PT.'S ATTENDING PHYSICIAN. NOTIFIED EVA.

## 2014-03-03 NOTE — Progress Notes (Signed)
Received call from lab, pt is positive for c-diff.

## 2014-03-03 NOTE — Discharge Summary (Signed)
Physician Discharge Summary  Victor Little LAG:[id number] DOB: 09-08-1945 DOA: 02/25/2014  PCP: [name], MD  Admit date: 02/25/2014 Discharge date: 03/03/2014  Time spent: Greater than 30 minutes  Recommendations for Outpatient Follow-up:  1. Patient will discharge to [facility] on 03/04/14 for hospice care. Dr. [name] will be hospice attending.  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  Filed Weights   02/25/14 1747 02/26/14 0359 02/28/14 0630  Weight: 68.5 kg (151 lb 0.2 oz) 69.8 kg (153 lb 14.1 oz) 74 kg (163 lb 2.3 oz)    History of present illness:  69 y.o. male with history of metastatic rectal cancer, with enlarged perirectal lymph node and liver metastasis, s/p palliative XRT and was on observation by Oncology, rectal bleeding secondary to cancer, deaf, mute, mental retardation, HTN, C. difficile, anemia sent from [place] [facility] status post unwitnessed fall and generalized weakness. In the ED, noted to be hypotensive, tachycardic and hypoxic. Chest x-ray not suggestive of pneumonia. In and out urinary catheterization revealed cloudy urine with sediments. Patient was admitted to step down unit for severe sepsis due to UTI, dehydration, acute renal failure and hypoxia.  Hospital Course:   Patient was admitted to step down unit. She was treated with proximal spectrum IV antibiotics  including IV vancomycin and cefepime. Urine cultures eventually showed Escherichia coli. Vancomycin was discontinued. Cefepime was continued. After doing well for a few days, he started spiking fevers of unclear etiology. Infectious disease was consulted. Patient underwent CT scan of chest abdomen and pelvis with contrast to look for septic focus. This however revealed metastatic cancer to lungs, liver, mediastinal and lateral peritoneal lymph nodes along with bilateral hydroureteronephrosis and distended bladder. Oncology was consulted and communicated with patient using sign language and with sister via phone. It was determined that patient was not a candidate for any form of aggressive cancer treatment and the most appropriate option would be hospice care. Sister was agreeable. I discussed with patient's sister Ms. Victor Little on 03/03/14 and confirmed full DO NOT RESUSCITATE status, DC all labs and medications not needed for comfort oriented care, DC all antibiotics except oral vancomycin to treat for discomforting diarrhea and transferred to [facility] for hospice care. Foley catheter was placed for urinary retention and bilateral hydroureteronephrosis and will continue same at discharge for comfort. Recent fevers may be secondary to C. difficile versus other differential diagnosis include antibiotic related fever or tumor fever. Acute renal failure resolved. Hypoxia resolved and patient's VQ scan was negative. Hypoxia could have been secondary to pulmonary metastasis. He has chronic anemia and hemoglobin has dropped to 7.3 in the absence of overt bleeding but as confirmed with healthcare power of attorney, no plans for repeat blood draws or transfusions given comfort oriented care. Blood pressures have been normal despite holding antihypertensives and hence will not be resumed.  Consultations:  Infectious disease  Medical oncology  Procedures:  Foley catheter    Discharge Exam:  Complaints:   Multiple diarrhea.  Filed Vitals:   03/03/14 0207 03/03/14 0545 03/03/14 1436 03/03/14 1735  BP:  103/61 126/84   Pulse:  115 126   Temp: 98.4 F (36.9 C) 98.6 F (37 C) 98.9 F (37.2 C)   TempSrc: Oral Axillary Axillary   Resp:  16 20   Height:      Weight:      SpO2:  99% 98% 98%    General exam: Pleasant middle-aged male lying comfortably in bed. Ill looking and has progressively declined over the last 2-3 days. Respiratory system: Clear. No increased work of breathing. Cardiovascular system: S1 & S2 heard, regular tachycardia. No JVD, murmurs, gallops, clicks or pedal edema.  Gastrointestinal system: Abdomen is nondistended, soft and nontender. Normal bowel sounds heard. Foley cath + Central nervous system: Alert but not as responsive. No focal neurological deficits. Extremities: Symmetric 5 x 5 power. Skin: No obvious rashes or open wounds.  Discharge Instructions      Discharge Instructions    Call MD for:  difficulty breathing, headache or visual disturbances    Complete by:  As directed      Call MD for:  persistant nausea and vomiting    Complete by:  As directed      Call MD for:  redness, tenderness, or signs of infection (pain, swelling, redness, odor or green/yellow discharge around incision site)    Complete by:  As directed      Call MD for:  severe uncontrolled pain    Complete by:  As directed      Call MD for:  temperature >100.4    Complete by:  As directed      Diet general    Complete by:  As directed      Discharge instructions    Complete by:  As directed   Patient to discharge with Foley catheter.     Increase activity slowly    Complete by:  As directed             Medication List    STOP taking these medications        benazepril 5 MG tablet  Commonly known as:  LOTENSIN     ferrous sulfate 325 (65 FE) MG tablet     furosemide 20 MG tablet  Commonly known as:  LASIX     metoprolol tartrate 25 MG tablet  Commonly known as:   LOPRESSOR     PRESCRIPTION MEDICATION     saccharomyces boulardii 250 MG capsule  Commonly known as:  FLORASTOR     traMADol 50 MG tablet  Commonly known as:  ULTRAM      TAKE these medications        acetaminophen 325 MG tablet  Commonly known as:  TYLENOL  Take 2 tablets (650 mg total) by mouth every 6 (six) hours as needed for fever (or Fever >/= 101).     albuterol (5 MG/ML) 0.5% nebulizer solution  Commonly known as:  PROVENTIL  Take 0.63 mg by nebulization every 2 (two) hours as needed for wheezing or shortness of breath. 0.63mg /65ml     ALPRAZolam 0.25 MG tablet  Commonly known as:  XANAX  Take 1 tablet (0.25 mg total) by mouth 3 (three) times daily.     divalproex 125 MG DR tablet  Commonly known as:  DEPAKOTE  Take 125 mg by mouth at  bedtime.     feeding supplement (ENSURE COMPLETE) Liqd  Take 237 mLs by mouth 3 (three) times daily between meals.     gabapentin 300 MG capsule  Commonly known as:  NEURONTIN  Take 1 capsule by mouth 3 (three) times daily.     loratadine 10 MG tablet  Commonly known as:  CLARITIN  Take 10 mg by mouth daily.     morphine CONCENTRATE 10 MG/0.5ML Soln concentrated solution  Take 0.25 mLs (5 mg total) by mouth every 3 (three) hours as needed for shortness of breath (pain).     multivitamin with minerals Tabs tablet  Take 1 tablet by mouth daily.     vancomycin 50 mg/mL oral solution  Commonly known as:  VANCOCIN  Take 2.5 mLs (125 mg total) by mouth every 6 (six) hours. Treat for 10 days beginning 03/03/14.          The results of significant diagnostics from this hospitalization (including imaging, microbiology, ancillary and laboratory) are listed below for reference.    Significant Diagnostic Studies: Dg Chest 2 View  02/26/2014   CLINICAL DATA:  Subsequent encounter for followup of pulmonary nodule.  EXAM: CHEST  2 VIEW  COMPARISON:  02/25/2014 and CT of 03/15/2013.  FINDINGS: Numerous leads and wires project over the  chest. Patient rotated right. Normal heart size. Left hemidiaphragm elevation is mild. No pleural effusion or pneumothorax. The nodular density projecting over the right lung base is not readily apparent. Right upper lobe nodular density is not readily apparent. This area is suboptimally evaluated secondary to obliquity. There is nodularity within both lungs. Potential left apical nodule measures 1.1 cm. Smaller bilateral mid lung nodules. Left-sided nodule may be calcified and represent a granuloma.  No lobar consolidation.  IMPRESSION: Scattered pulmonary nodules suspected. The right upper lobe/ apical nodule described on the prior exam is not readily apparent and may have represented summation of osseous shadows. Regardless, given the history of colon cancer and small pulmonary nodules on 03/15/2013 CT (which is motion degraded), followup with CT should be considered.   Electronically Signed   By: Abigail Miyamoto M.D.   On: 02/26/2014 15:21   Ct Chest W Contrast  03/01/2014   CLINICAL DATA:  69 year old male with acute fever of unknown origin. History of metastatic rectal cancer.  EXAM: CT CHEST, ABDOMEN, AND PELVIS WITH CONTRAST  TECHNIQUE: Multidetector CT imaging of the chest, abdomen and pelvis was performed following the standard protocol during bolus administration of intravenous contrast.  CONTRAST:  48mL OMNIPAQUE IOHEXOL 300 MG/ML  SOLN  COMPARISON:  03/15/2013 CT  FINDINGS: CT CHEST FINDINGS  The heart is unremarkable except for moderate coronary artery calcifications.  There is no evidence of thoracic aortic aneurysm. No large/ central pulmonary emboli are identified.  Enlarging mediastinal lymph nodes are compatible with increasing metastatic disease, index nodes include the following:  A 1.3 cm right paratracheal node (image 17), previously 0.7 cm.  A 1.2 cm carinal node (image 29), previously 0.8 cm.  Tiny bilateral pleural effusions and mild bibasilar atelectasis noted.  Multiple new and enlarging  bilateral pulmonary metastases identified with an index left apical metastasis now measuring 1 x 1.4 cm (image 13), previously 0.5 x 0.6 cm.  No acute bony abnormalities are identified.  CT ABDOMEN AND PELVIS FINDINGS  Multiple new and enlarging hepatic masses are identified compatible with progressive metastatic disease. Index left hepatic metastasis now measures 6.6 x 6.7 cm (image 51) previously measuring 2.4 x 3 cm.  Mild splenomegaly again noted.  Pancreas, gallbladder and adrenal glands are unremarkable.  New moderate right and moderate to severe left hydroureteronephrosis extending to a distended bladder is noted without definite obstructing cause.  Enlarging retroperitoneal lymph nodes are identified.  Mild thickening and irregularity of the rectum with noted.  There is no evidence of bowel obstruction or pneumoperitoneum. No acute or suspicious bony abnormalities are identified.  IMPRESSION: Progressive metastatic disease manifested by new/enlarging diffuse pulmonary and hepatic metastases and enlarging mediastinal/retroperitoneal lymph nodes.  New moderate right and moderate to severe left hydroureteronephrosis extending to a distended bladder. No definite obstructing cause identified.  Mild rectal wall thickening and irregularity with adjacent stranding. Recurrent/residual tumor is not excluded.  Tiny bilateral pleural effusions and mild bibasilar atelectasis.   Electronically Signed   By: Margarette Canada M.D.   On: 03/01/2014 16:06   Nm Pulmonary Perfusion  02/26/2014   CLINICAL DATA:  Short of breath. Concern pulmonary embolism. History of colorectal malignancy.  EXAM: NUCLEAR MEDICINE  PERFUSION LUNG SCAN  TECHNIQUE: Patient refused ventilation portion exam. Perfusion images were obtained in multiple projections after intravenous injection of Tc-26m MAA.  RADIOPHARMACEUTICALS:  5.2 mCi Tc-80m MAA.  COMPARISON:  Radiograph 02/26/2014  FINDINGS: No wedge-shaped peripheral perfusion defects within the left  or right lung to suggest acute pulmonary embolism.  IMPRESSION: No evidence of acute pulmonary embolism.   Electronically Signed   By: Suzy Bouchard M.D.   On: 02/26/2014 14:35   Ct Abdomen Pelvis W Contrast  03/01/2014   CLINICAL DATA:  69 year old male with acute fever of unknown origin. History of metastatic rectal cancer.  EXAM: CT CHEST, ABDOMEN, AND PELVIS WITH CONTRAST  TECHNIQUE: Multidetector CT imaging of the chest, abdomen and pelvis was performed following the standard protocol during bolus administration of intravenous contrast.  CONTRAST:  24mL OMNIPAQUE IOHEXOL 300 MG/ML  SOLN  COMPARISON:  03/15/2013 CT  FINDINGS: CT CHEST FINDINGS  The heart is unremarkable except for moderate coronary artery calcifications.  There is no evidence of thoracic aortic aneurysm. No large/ central pulmonary emboli are identified.  Enlarging mediastinal lymph nodes are compatible with increasing metastatic disease, index nodes include the following:  A 1.3 cm right paratracheal node (image 17), previously 0.7 cm.  A 1.2 cm carinal node (image 29), previously 0.8 cm.  Tiny bilateral pleural effusions and mild bibasilar atelectasis noted.  Multiple new and enlarging bilateral pulmonary metastases identified with an index left apical metastasis now measuring 1 x 1.4 cm (image 13), previously 0.5 x 0.6 cm.  No acute bony abnormalities are identified.  CT ABDOMEN AND PELVIS FINDINGS  Multiple new and enlarging hepatic masses are identified compatible with progressive metastatic disease. Index left hepatic metastasis now measures 6.6 x 6.7 cm (image 51) previously measuring 2.4 x 3 cm.  Mild splenomegaly again noted.  Pancreas, gallbladder and adrenal glands are unremarkable.  New moderate right and moderate to severe left hydroureteronephrosis extending to a distended bladder is noted without definite obstructing cause.  Enlarging retroperitoneal lymph nodes are identified.  Mild thickening and irregularity of the rectum  with noted.  There is no evidence of bowel obstruction or pneumoperitoneum. No acute or suspicious bony abnormalities are identified.  IMPRESSION: Progressive metastatic disease manifested by new/enlarging diffuse pulmonary and hepatic metastases and enlarging mediastinal/retroperitoneal lymph nodes.  New moderate right and moderate to severe left hydroureteronephrosis extending to a distended bladder. No definite obstructing cause identified.  Mild rectal wall thickening and irregularity with adjacent stranding. Recurrent/residual tumor is  not excluded.  Tiny bilateral pleural effusions and mild bibasilar atelectasis.   Electronically Signed   By: Margarette Canada M.D.   On: 03/01/2014 16:06   Dg Chest Port 1 View  02/25/2014   CLINICAL DATA:  69 year old male with shortness of breath and decreased responsiveness  EXAM: PORTABLE CHEST - 1 VIEW  COMPARISON:  Prior CT scan of the chest 03/15/2013  FINDINGS: Cardiac and mediastinal contours are within normal limits. Inspiratory volumes are very low. Central bronchitic change in diffuse prominence of interstitial markings are noted. There is a 1.5 cm spiculated nodular opacity in the right lung apex posterior to the clavicular head. The smaller nodular opacity is identified just above the diaphragm in the right lung base. No acute osseous abnormality.  IMPRESSION: 1. Low inspiratory volumes with central bronchitic change in diffuse interstitial prominence. Differential considerations include atypical infection and mild interstitial edema. 2. A 1.5 cm nodular opacity is noted in the right lung apex posterior to the clavicular head. Similar findings were seen on prior chest x-rays dating back to 2014 although not as conspicuous as on today's examination. This finding is favored to reflect an incidental confluence of shadows. Recommend repeat dedicated PA and lateral chest x-ray when the patient is able. If the nodular opacity remains present and appears more conspicuous  than on prior imaging, further evaluation with chest CT may become warranted. 3. Nonspecific nodular opacity in the right lung base could also be better evaluated on dedicated PA and lateral chest x-ray.   Electronically Signed   By: Jacqulynn Cadet M.D.   On: 02/25/2014 10:44    Microbiology: Recent Results (from the past 240 hour(s))  Blood Culture (routine x 2)     Status: None   Collection Time: 02/25/14 10:11 AM  Result Value Ref Range Status   Specimen Description BLOOD RIGHT WRIST  Final   Special Requests BOTTLES DRAWN AEROBIC AND ANAEROBIC 5 CC EA  Final   Culture   Final    NO GROWTH 5 DAYS Performed at [facility]    Report Status 03/03/2014 FINAL  Final  Blood Culture (routine x 2)     Status: None   Collection Time: 02/25/14 10:13 AM  Result Value Ref Range Status   Specimen Description BLOOD LEFT HAND  Final   Special Requests BOTTLES DRAWN AEROBIC AND ANAEROBIC 5 CC EA  Final   Culture   Final    NO GROWTH 5 DAYS Performed at [facility]    Report Status 03/03/2014 FINAL  Final  Urine culture     Status: None   Collection Time: 02/25/14 11:30 AM  Result Value Ref Range Status   Specimen Description URINE, CATHETERIZED  Final   Special Requests NONE  Final   Colony Count   Final    >=100,000 COLONIES/ML Performed at [facility]    Culture   Final    ESCHERICHIA COLI Performed at [facility]    Report Status 03/01/2014 FINAL  Final   Organism ID, Bacteria ESCHERICHIA COLI  Final      Susceptibility   Escherichia coli - MIC*    AMPICILLIN 16 INTERMEDIATE Intermediate     CEFAZOLIN <=4 SENSITIVE Sensitive     CEFTRIAXONE <=1 SENSITIVE Sensitive     CIPROFLOXACIN <=0.25 SENSITIVE Sensitive     GENTAMICIN <=1 SENSITIVE Sensitive     LEVOFLOXACIN <=0.12 SENSITIVE Sensitive     NITROFURANTOIN 32 SENSITIVE Sensitive     TOBRAMYCIN <=1 SENSITIVE Sensitive  TRIMETH/SULFA <=20 SENSITIVE Sensitive     PIP/TAZO <=4 SENSITIVE  Sensitive     * ESCHERICHIA COLI  MRSA PCR Screening     Status: None   Collection Time: 02/25/14  6:21 PM  Result Value Ref Range Status   MRSA by PCR NEGATIVE NEGATIVE Final    Comment:        The GeneXpert MRSA Assay (FDA approved for NASAL specimens only), is one component of a comprehensive MRSA colonization surveillance program. It is not intended to diagnose MRSA infection nor to guide or monitor treatment for MRSA infections.   Culture, blood (routine x 2)     Status: None (Preliminary result)   Collection Time: 02/28/14  4:12 PM  Result Value Ref Range Status   Specimen Description BLOOD RIGHT ARM  Final   Special Requests   Final    BOTTLES DRAWN AEROBIC AND ANAEROBIC 10CC BOTH BOTTLES   Culture   Final           BLOOD CULTURE RECEIVED NO GROWTH TO DATE CULTURE WILL BE HELD FOR 5 DAYS BEFORE ISSUING A FINAL NEGATIVE REPORT Performed at [facility]    Report Status PENDING  Incomplete  Culture, blood (routine x 2)     Status: None (Preliminary result)   Collection Time: 02/28/14  4:18 PM  Result Value Ref Range Status   Specimen Description BLOOD LEFT HAND  Final   Special Requests   Final    BOTTLES DRAWN AEROBIC AND ANAEROBIC 10CC BOTH BOTTLES.   Culture   Final           BLOOD CULTURE RECEIVED NO GROWTH TO DATE CULTURE WILL BE HELD FOR 5 DAYS BEFORE ISSUING A FINAL NEGATIVE REPORT Performed at [facility]    Report Status PENDING  Incomplete  Clostridium Difficile by PCR     Status: Abnormal   Collection Time: 03/03/14 10:11 AM  Result Value Ref Range Status   C difficile by pcr POSITIVE (A) NEGATIVE Final    Comment: CRITICAL RESULT CALLED TO, READ BACK BY AND VERIFIED WITH: G.YATES,RN 03/03/14 1349 BY BSLADE Performed at [place]: Basic Metabolic Panel:  Recent Labs Lab 02/27/14 0359 02/28/14 0500 03/01/14 0444 03/02/14 0447 03/03/14 0415  NA 142 139 144 138 143  K 3.7 4.3 5.0 4.4 4.1  CL 112 110 114* 108  114*  CO2 21 22 22 22 23   GLUCOSE 90 100* 108* 107* 114*  BUN 35* 32* 34* 31* 28*  CREATININE 1.36* 1.29 1.48* 1.34 1.23  CALCIUM 7.3* 7.4* 7.8* 7.4* 7.3*   Liver Function Tests:  Recent Labs Lab 02/26/14 0400 02/27/14 0359 02/28/14 0500 03/01/14 0444 03/02/14 0447  AST 59* 75* 112* 94* 73*  ALT 33 38 56* 56* 45  ALKPHOS 379* 406* 443* 421* 334*  BILITOT 0.9 0.8 0.7 0.7 0.7  PROT 7.0 6.1 6.0 6.3 6.2  ALBUMIN 2.4* 2.1* 1.8* 2.0* 1.8*   No results for input(s): LIPASE, AMYLASE in the last 168 hours. No results for input(s): AMMONIA in the last 168 hours. CBC:  Recent Labs Lab 02/25/14 1012  02/27/14 0359 02/28/14 0500 03/01/14 0444 03/02/14 0447 03/03/14 0415  WBC 14.9*  < > 13.4* 12.2* 21.8* 17.5* 11.9*  NEUTROABS 12.2*  --   --   --   --   --   --   HGB 10.7*  < > 9.0* 8.2* 9.1* 8.3* 7.3*  HCT 35.3*  < > 29.8* 26.2*  29.4* 27.5* 23.2*  MCV 76.6*  < > 76.6* 75.7* 76.4* 75.3* 76.1*  PLT 488*  < > 342 316 320 300 263  < > = values in this interval not displayed. Cardiac Enzymes: No results for input(s): CKTOTAL, CKMB, CKMBINDEX, TROPONINI in the last 168 hours. BNP: BNP (last 3 results) No results for input(s): BNP in the last 8760 hours.  ProBNP (last 3 results) No results for input(s): PROBNP in the last 8760 hours.  CBG: No results for input(s): GLUCAP in the last 168 hours.    Signed:  Vernell Leep, MD, FACP, FHM. [facility] Pager [phone]  If 7PM-7AM, please contact night-coverage [email or web address] Password [facility] 03/03/2014, 6:26 PM

## 2014-03-03 NOTE — Progress Notes (Signed)
{  SLP ASLP Cancellation Note  Patient Details Name: Victor Little MRN: [id number] DOB: 1945-07-06   Cancelled treatment:       Reason Eval/Treat Not Completed:  (pt conducting self care and it was requested SLP return at later time)   Luanna Salk, [facility] SLP [phone]

## 2014-03-03 NOTE — Progress Notes (Signed)
CSW continuing to follow.  CSW received notification from [facility], Erling Conte that pt has bed available at [facility], Wednesday, 03/04/14. Per [employer], she has been in communication with pt sister/guardian and pt sister is aware of bed availability tomorrow.  CSW notified RN and MD.  CSW to facilitate pt discharge needs to [facility] tomorrow.   Alison Murray, MSW, [employer] [phone]

## 2014-03-04 LAB — CREATININE, SERUM
Creatinine, Ser: 1.18 mg/dL (ref 0.50–1.35)
GFR calc Af Amer: 72 mL/min — ABNORMAL LOW (ref >90)
GFR calc non Af Amer: 62 mL/min — ABNORMAL LOW (ref >90)

## 2014-03-04 NOTE — Care Management Note (Signed)
CARE MANAGEMENT NOTE 03/04/2014  Patient:  Victor Little, Victor Little   Account Number:  [id number]  Date Initiated:  03/04/2014  Documentation initiated by:  Marney Doctor  Subjective/Objective Assessment:   69 yo admitted with Severe sepsis with acute organ dysfunction     Action/Plan:   From [place] snf   Anticipated DC Date:  03/04/2014   Anticipated DC Plan:  [place]  In-house referral  Clinical Social Worker      DC Planning Services  CM consult      Choice offered to / List presented to:             Status of service:  Completed, signed off Medicare Important Message given?  YES (If response is "NO", the following Medicare IM given date fields will be blank) Date Medicare IM given:  03/04/2014 Medicare IM given by:  Marney Doctor Date Additional Medicare IM given:   Additional Medicare IM given by:    Discharge Disposition:    Per UR Regulation:  Reviewed for med. necessity/level of care/duration of stay  If discussed at [place] of Stay Meetings, dates discussed:    Comments:  03/04/14 Marney Doctor RN,BSN,NCM

## 2014-03-04 NOTE — Progress Notes (Signed)
Patient discharged via ambulance to [facility].  Report called to [facility], RN.  Patient appears stable for transport.  Zandra Abts [facility]  03/04/2014  12:23 PM

## 2014-03-04 NOTE — Progress Notes (Addendum)
Discharge note prepared by Dr. Algis Liming.  Discussed transitioning patient to hospice care with sister to continue full comfort measures.  No new requests made and plan will be to continue discharge plans as outlined by Dr. Algis Liming.  Oral vancomycin to be continued for 10 days total with first dose starting at 03/03/14. As such patient will require 9 more days.  Of note from d/c summary: Foley catheter was placed for urinary retention and bilateral hydroureteronephrosis and will continue same at discharge for comfort.  Will recommend that physician at hospice facility continue to manage moving forward.  Will plan on transitioning patient to [facility] place for comfort care measures as outlined by associate in D/c summary.  Hezikiah Retzloff  Patient independently examined on day of DC:  Pt in nad, alert and awake RRR, no rubs CTA BL, no wheezes, equal chest rise Abdomen soft, non tender, + bowel sounds

## 2014-03-04 NOTE — Progress Notes (Signed)
Pt for discharge to [facility]   CSW facilitated pt discharge needs including contacting facility, faxing pt discharge information to [facility], providing RN phone number to call report [phone]), discussing with pt sister via telephone, and arranging ambulance transport for pt to [facility].   CSW contacted pt sister/HCPOA, [name] via telephone. CSW notified pt sister that CSW has arranged ambulance transport and pt will be transitioning over to [facility] today. Pt sister expressed understanding and grateful that bed at [facility] became available.  No further social work needs identified at this time.  CSW signing off.   Alison Murray, MSW, [place] [employer] [phone]

## 2014-03-04 NOTE — Consult Note (Signed)
[facility] Liaison: [employer] available for Mr. Counterman today. Dr. Benay Spice to assume care per family request. Spoke with patient's sister/guardian Thayer Headings by phone. She lives in [place] and does not drive. She gave permission for their sister Malachy Mood to complete paper work which she did yesterday afternoon. Please fax discharge summary to [phone]. RN please call report to [phone]. Please arrange transport for patient to arrive as early in day as possible. Thank you. Erling Conte LCSW [phone]

## 2014-03-06 LAB — CULTURE, BLOOD (ROUTINE X 2)
CULTURE: NO GROWTH
Culture: NO GROWTH

## 2014-03-09 ENCOUNTER — Telehealth: Payer: Self-pay | Admitting: *Deleted

## 2014-03-09 NOTE — Telephone Encounter (Signed)
Received fax from [facility] informing Dr. Benay Spice of pt's death on 03/12/2014 at April 19, 2308.

## 2014-03-17 DEATH — deceased

## 2015-10-08 IMAGING — CT CT CHEST W/ CM
2 of 4 series · 15 of 36 positions shown, 18 images · IV contrast (omnipaque)
Comparison: CT chest dated 12/11/2012

CLINICAL DATA: Suspected metastatic rectal mass, for staging.
Shortness of breath, leukocytosis, history of pneumonia.

EXAM:
CT CHEST WITH CONTRAST
TECHNIQUE: Multidetector CT imaging of the chest was performed during
intravenous contrast administration.
CONTRAST:  80mL OMNIPAQUE IOHEXOL 300 MG/ML  SOLN

[Series 2: chest with st · axial · 0.70mm/px · z∈[-351,-76]mm · 12 of 65 slices shown, 15 images]
[im 5/65  mediastinal]
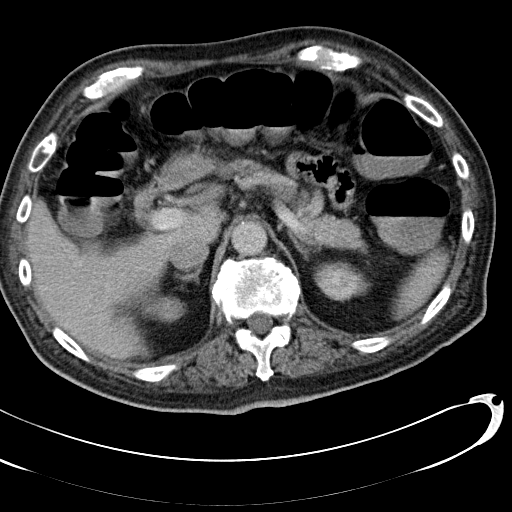
[im 5/65  lung]
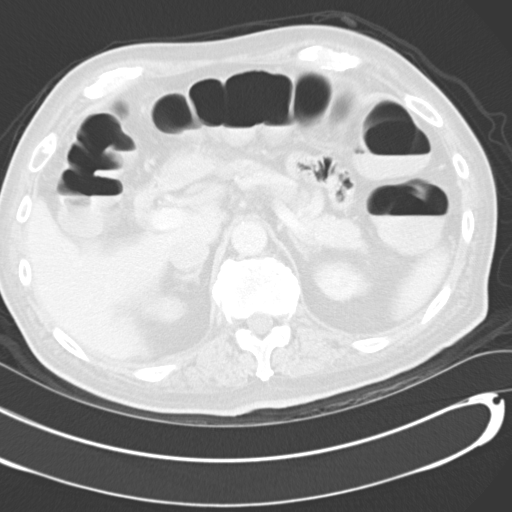
[im 10/65  lung]
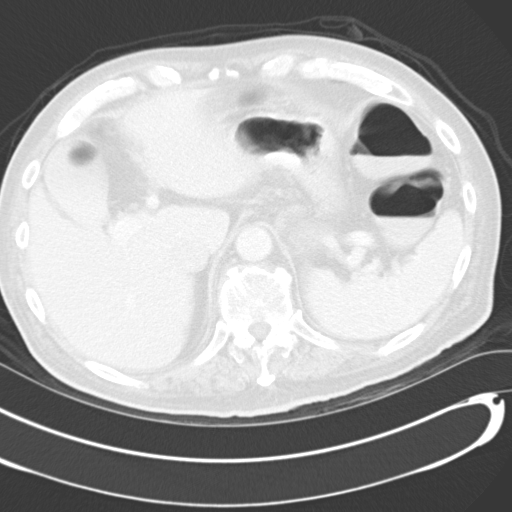
[im 14/65  lung]
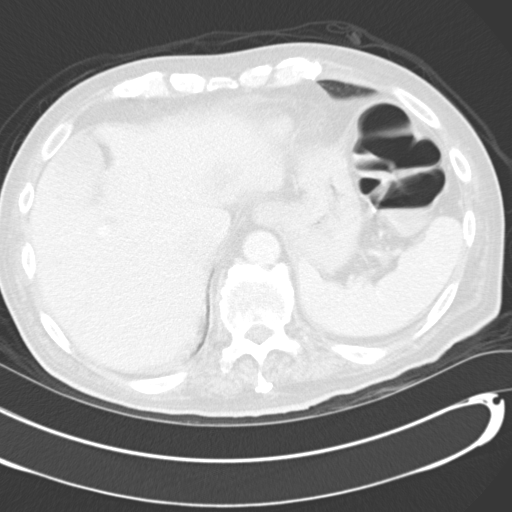
[im 19/65  lung]
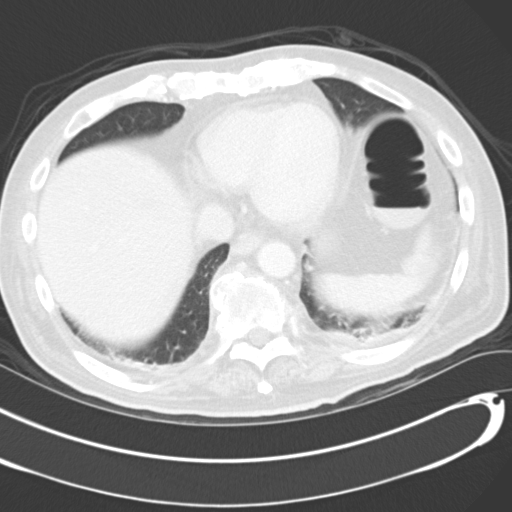
[im 23/65  mediastinal]
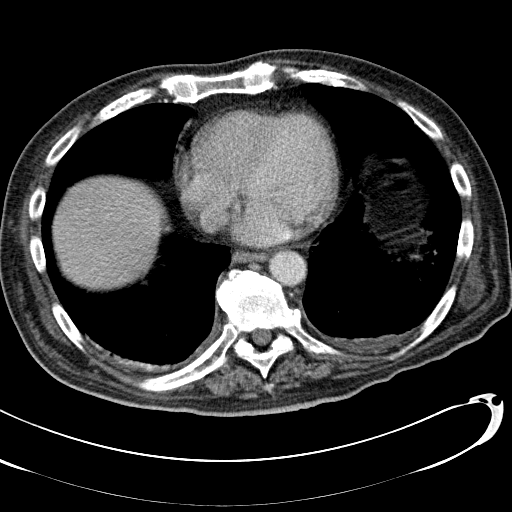
[im 23/65  lung]
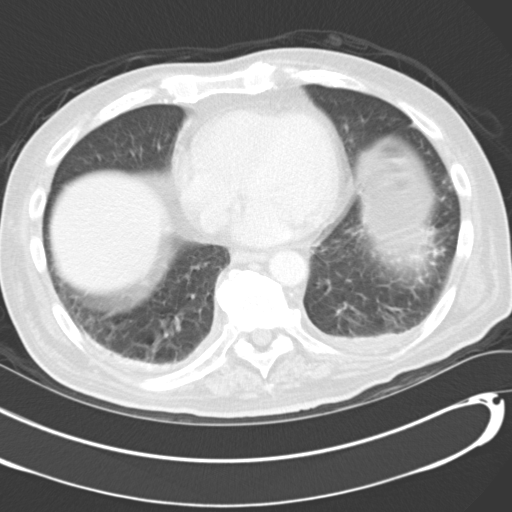
[im 28/65  lung]
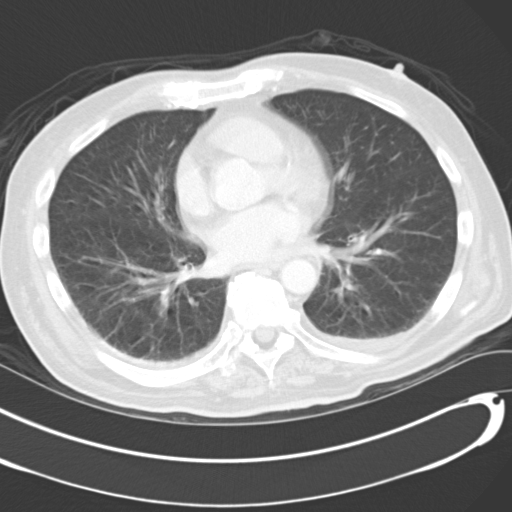
[im 37/65  lung]
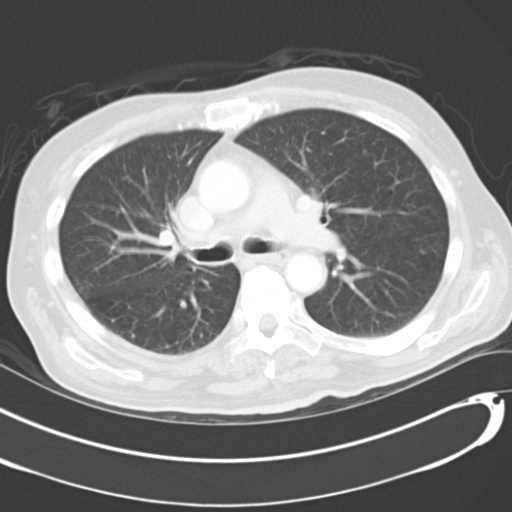
[im 42/65  lung]
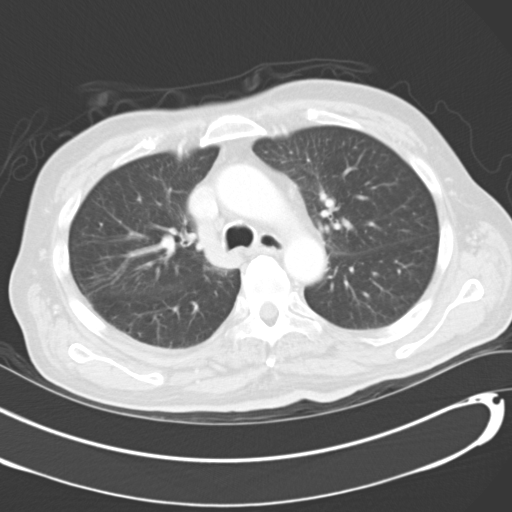
[im 46/65  mediastinal]
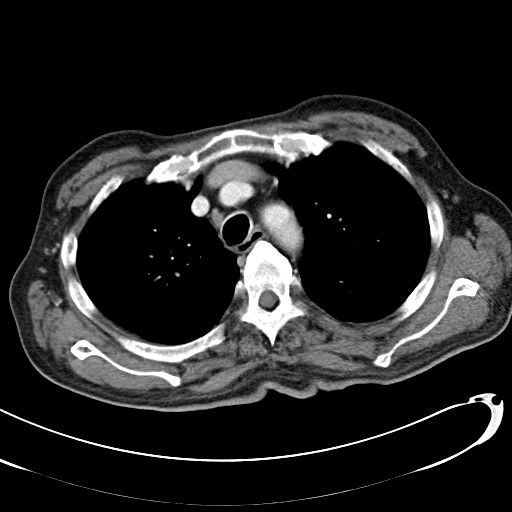
[im 46/65  lung]
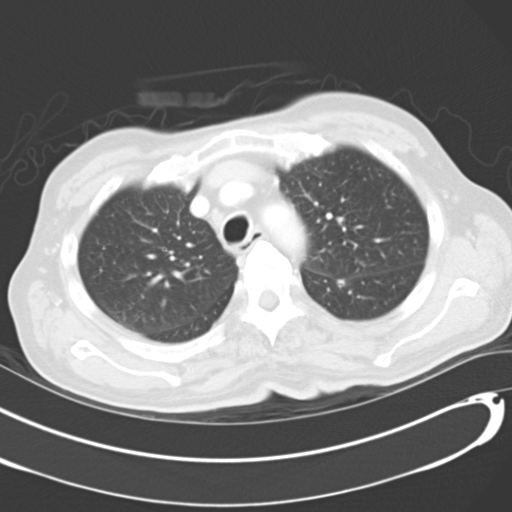
[im 51/65  lung]
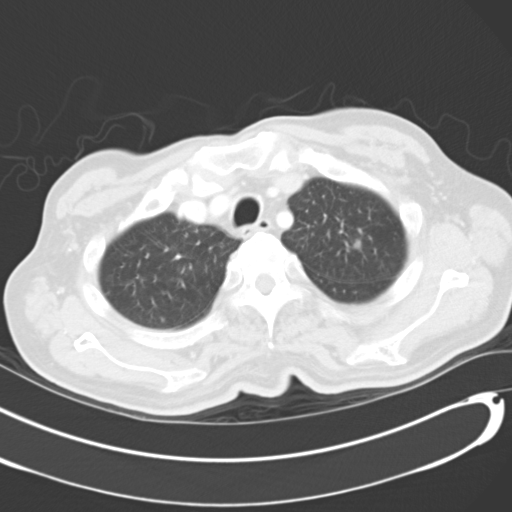
[im 55/65  lung]
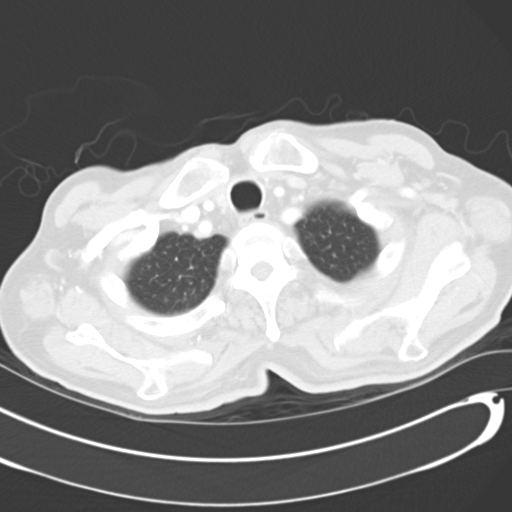
[im 60/65  lung]
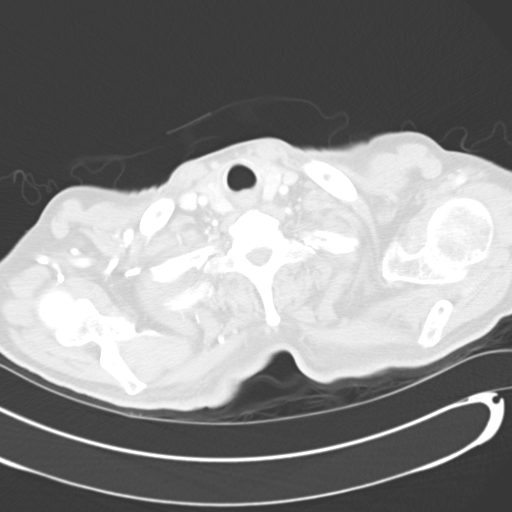

[Series 602: <mpr thick range> · coronal · 0.70mm/px · 3 of 132 slices shown]
[im 27/132  lung]
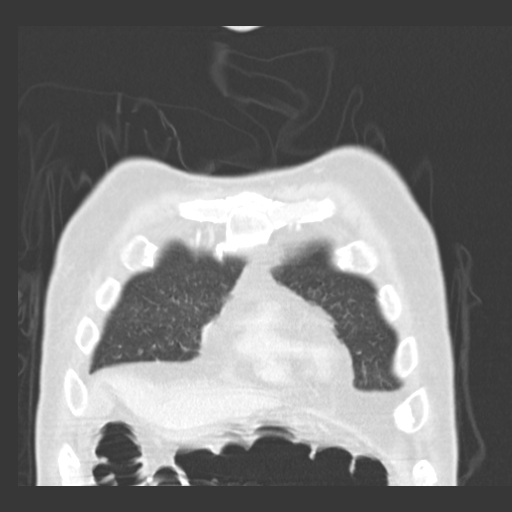
[im 53/132  lung]
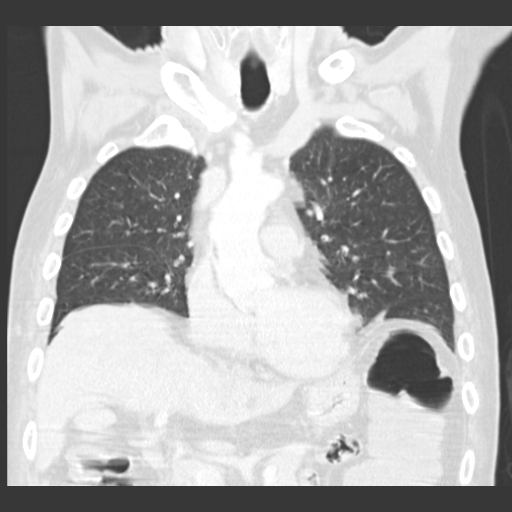
[im 79/132  lung]
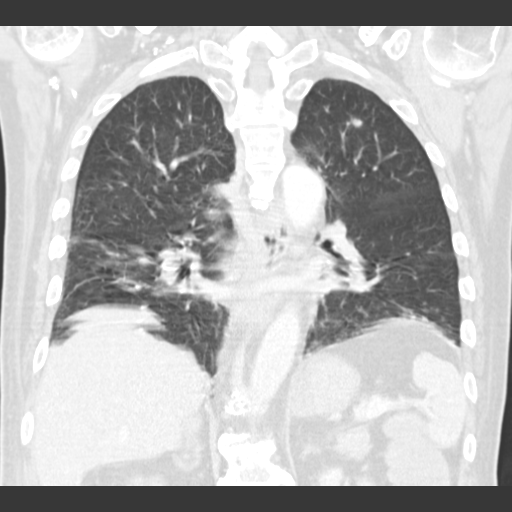

[15 of 36 positions shown; findings below may reference images not displayed]

FINDINGS: Evaluation of the lung parenchyma is constrained by respiratory
motion.

Two right upper lobe pulmonary nodules measuring up to 6 mm (series
5/images 14-15). This is significantly improved from the prior and
may reflect residual post infectious/inflammatory scarring.

6 mm left upper lobe nodule (series 5/image 15), grossly unchanged.

6 mm left lower lobe nodular opacity along the fissure (series 5/
image 20), more conspicuous on the current study but likely grossly
unchanged.

Multiple tree-in-bud nodular opacities in the left lower lobe,
measuring up to 5 mm (series 5/images 40 and 42), new from prior
chest CT and possibly infectious/inflammatory.

Trace bilateral pleural effusions.  No pneumothorax.

Visualized thyroid is unremarkable.

The heart is normal in size. No pericardial effusion. Coronary
atherosclerosis in the LAD.

Small right paratracheal and subcarinal lymph nodes measuring up to
7-8 mm short axis, within normal limits. No suspicious hilar or
axillary lymphadenopathy.

Visualized upper abdomen is unchanged, noting a 3.1 x 2.5 cm
hypoenhancing lesion in the central left hepatic lobe (series
2/image 54), suspicious for metastasis.

Degenerative changes of the visualized thoracic spine. Fusion at
T9-10.
IMPRESSION: Limited evaluation due to respiratory motion.

Scattered bilateral pulmonary nodules measuring up to 6 mm, as
described above. Residual right upper lobe nodularity is improved
and may reflect post infectious/inflammatory scarring. Left upper
lobe and superior left lower lobe nodularity are unchanged and
nonspecific. Left lower lobe tree-in-bud nodularity is new and
favored to be infectious/inflammatory.

None of these findings are specific for metastatic disease to the
chest. Attention on follow-up is suggested, preferably when patient
is better able to breath hold.
